# Patient Record
Sex: Female | Born: 1980 | Race: Black or African American | Hispanic: No | Marital: Married | Smoking: Never smoker
Health system: Southern US, Community
[De-identification: ages and names within clinical notes are randomized; demographics above are authoritative.]

## PROBLEM LIST (undated history)

## (undated) DIAGNOSIS — T7840XA Allergy, unspecified, initial encounter: Secondary | ICD-10-CM

## (undated) DIAGNOSIS — G4730 Sleep apnea, unspecified: Secondary | ICD-10-CM

## (undated) DIAGNOSIS — E669 Obesity, unspecified: Secondary | ICD-10-CM

## (undated) DIAGNOSIS — G43909 Migraine, unspecified, not intractable, without status migrainosus: Secondary | ICD-10-CM

## (undated) DIAGNOSIS — F419 Anxiety disorder, unspecified: Secondary | ICD-10-CM

## (undated) DIAGNOSIS — I10 Essential (primary) hypertension: Secondary | ICD-10-CM

## (undated) DIAGNOSIS — R635 Abnormal weight gain: Secondary | ICD-10-CM

## (undated) DIAGNOSIS — I2510 Atherosclerotic heart disease of native coronary artery without angina pectoris: Secondary | ICD-10-CM

## (undated) DIAGNOSIS — F329 Major depressive disorder, single episode, unspecified: Secondary | ICD-10-CM

## (undated) DIAGNOSIS — K589 Irritable bowel syndrome without diarrhea: Secondary | ICD-10-CM

## (undated) DIAGNOSIS — K219 Gastro-esophageal reflux disease without esophagitis: Secondary | ICD-10-CM

## (undated) DIAGNOSIS — Z6791 Unspecified blood type, Rh negative: Secondary | ICD-10-CM

## (undated) DIAGNOSIS — N926 Irregular menstruation, unspecified: Secondary | ICD-10-CM

## (undated) DIAGNOSIS — Z87440 Personal history of urinary (tract) infections: Secondary | ICD-10-CM

## (undated) DIAGNOSIS — Z8619 Personal history of other infectious and parasitic diseases: Secondary | ICD-10-CM

## (undated) DIAGNOSIS — F32A Depression, unspecified: Secondary | ICD-10-CM

## (undated) DIAGNOSIS — Z8639 Personal history of other endocrine, nutritional and metabolic disease: Secondary | ICD-10-CM

## (undated) DIAGNOSIS — J45909 Unspecified asthma, uncomplicated: Secondary | ICD-10-CM

## (undated) DIAGNOSIS — Z8669 Personal history of other diseases of the nervous system and sense organs: Secondary | ICD-10-CM

## (undated) HISTORY — DX: Abnormal weight gain: R63.5

## (undated) HISTORY — DX: Migraine, unspecified, not intractable, without status migrainosus: G43.909

## (undated) HISTORY — DX: Depression, unspecified: F32.A

## (undated) HISTORY — DX: Sleep apnea, unspecified: G47.30

## (undated) HISTORY — DX: Major depressive disorder, single episode, unspecified: F32.9

## (undated) HISTORY — DX: Gastro-esophageal reflux disease without esophagitis: K21.9

## (undated) HISTORY — DX: Atherosclerotic heart disease of native coronary artery without angina pectoris: I25.10

## (undated) HISTORY — DX: Anxiety disorder, unspecified: F41.9

## (undated) HISTORY — DX: Unspecified blood type, rh negative: Z67.91

## (undated) HISTORY — DX: Allergy, unspecified, initial encounter: T78.40XA

## (undated) HISTORY — DX: Irritable bowel syndrome, unspecified: K58.9

## (undated) HISTORY — DX: Essential (primary) hypertension: I10

## (undated) HISTORY — DX: Personal history of other diseases of the nervous system and sense organs: Z86.69

## (undated) HISTORY — DX: Personal history of other infectious and parasitic diseases: Z86.19

## (undated) HISTORY — DX: Irregular menstruation, unspecified: N92.6

## (undated) HISTORY — DX: Obesity, unspecified: E66.9

## (undated) HISTORY — DX: Personal history of other endocrine, nutritional and metabolic disease: Z86.39

## (undated) HISTORY — PX: OTHER SURGICAL HISTORY: SHX169

## (undated) HISTORY — PX: WISDOM TOOTH EXTRACTION: SHX21

## (undated) HISTORY — DX: Personal history of urinary (tract) infections: Z87.440

---

## 2002-03-19 DIAGNOSIS — R635 Abnormal weight gain: Secondary | ICD-10-CM

## 2002-03-19 DIAGNOSIS — N926 Irregular menstruation, unspecified: Secondary | ICD-10-CM

## 2002-03-19 HISTORY — DX: Irregular menstruation, unspecified: N92.6

## 2002-03-19 HISTORY — DX: Abnormal weight gain: R63.5

## 2002-12-30 ENCOUNTER — Other Ambulatory Visit: Admission: RE | Admit: 2002-12-30 | Discharge: 2002-12-30 | Payer: Self-pay | Admitting: Obstetrics and Gynecology

## 2003-08-03 ENCOUNTER — Emergency Department (HOSPITAL_COMMUNITY): Admission: EM | Admit: 2003-08-03 | Discharge: 2003-08-04 | Payer: Self-pay | Admitting: *Deleted

## 2003-12-11 ENCOUNTER — Emergency Department (HOSPITAL_COMMUNITY): Admission: EM | Admit: 2003-12-11 | Discharge: 2003-12-11 | Payer: Self-pay | Admitting: Emergency Medicine

## 2003-12-28 ENCOUNTER — Other Ambulatory Visit: Admission: RE | Admit: 2003-12-28 | Discharge: 2003-12-28 | Payer: Self-pay | Admitting: Obstetrics and Gynecology

## 2004-12-29 ENCOUNTER — Other Ambulatory Visit: Admission: RE | Admit: 2004-12-29 | Discharge: 2004-12-29 | Payer: Self-pay | Admitting: Obstetrics and Gynecology

## 2005-03-19 DIAGNOSIS — Z87440 Personal history of urinary (tract) infections: Secondary | ICD-10-CM

## 2005-03-19 HISTORY — DX: Personal history of urinary (tract) infections: Z87.440

## 2006-01-29 ENCOUNTER — Other Ambulatory Visit: Admission: RE | Admit: 2006-01-29 | Discharge: 2006-01-29 | Payer: Self-pay | Admitting: Obstetrics and Gynecology

## 2006-04-20 ENCOUNTER — Emergency Department (HOSPITAL_COMMUNITY): Admission: EM | Admit: 2006-04-20 | Discharge: 2006-04-20 | Payer: Self-pay | Admitting: Emergency Medicine

## 2009-03-19 DIAGNOSIS — I2510 Atherosclerotic heart disease of native coronary artery without angina pectoris: Secondary | ICD-10-CM

## 2009-03-19 DIAGNOSIS — Z8639 Personal history of other endocrine, nutritional and metabolic disease: Secondary | ICD-10-CM

## 2009-03-19 HISTORY — DX: Personal history of other endocrine, nutritional and metabolic disease: Z86.39

## 2009-03-19 HISTORY — DX: Atherosclerotic heart disease of native coronary artery without angina pectoris: I25.10

## 2010-02-07 ENCOUNTER — Inpatient Hospital Stay (HOSPITAL_COMMUNITY)
Admission: AD | Admit: 2010-02-07 | Discharge: 2010-02-07 | Payer: Self-pay | Source: Home / Self Care | Admitting: Obstetrics and Gynecology

## 2010-05-30 LAB — RH IMMUNE GLOBULIN WORKUP (NOT WOMEN'S HOSP)
Antibody Screen: NEGATIVE
Unit division: 0

## 2010-06-10 ENCOUNTER — Inpatient Hospital Stay (HOSPITAL_COMMUNITY)
Admission: AD | Admit: 2010-06-10 | Discharge: 2010-06-10 | Disposition: A | Discharge status: Home / Self Care | Payer: BC Managed Care – PPO | Source: Ambulatory Visit | Attending: Obstetrics and Gynecology | Admitting: Obstetrics and Gynecology

## 2010-06-10 DIAGNOSIS — O368190 Decreased fetal movements, unspecified trimester, not applicable or unspecified: Secondary | ICD-10-CM | POA: Insufficient documentation

## 2010-06-10 LAB — URINALYSIS, ROUTINE W REFLEX MICROSCOPIC
Hgb urine dipstick: NEGATIVE
Nitrite: NEGATIVE
Protein, ur: NEGATIVE mg/dL
Urobilinogen, UA: 0.2 mg/dL (ref 0.0–1.0)

## 2010-07-01 ENCOUNTER — Inpatient Hospital Stay (HOSPITAL_COMMUNITY)
Admission: AD | Admit: 2010-07-01 | Discharge: 2010-07-01 | Disposition: A | Discharge status: Home / Self Care | Payer: BC Managed Care – PPO | Source: Ambulatory Visit | Attending: Obstetrics and Gynecology | Admitting: Obstetrics and Gynecology

## 2010-07-01 DIAGNOSIS — Z2989 Encounter for other specified prophylactic measures: Secondary | ICD-10-CM | POA: Insufficient documentation

## 2010-07-01 DIAGNOSIS — Z298 Encounter for other specified prophylactic measures: Secondary | ICD-10-CM | POA: Insufficient documentation

## 2010-07-01 DIAGNOSIS — Z3480 Encounter for supervision of other normal pregnancy, unspecified trimester: Secondary | ICD-10-CM | POA: Insufficient documentation

## 2010-07-02 LAB — RH IMMUNE GLOBULIN WORKUP (NOT WOMEN'S HOSP)
ABO/RH(D): O NEG
Unit division: 0

## 2010-08-18 ENCOUNTER — Inpatient Hospital Stay (HOSPITAL_COMMUNITY): Payer: BC Managed Care – PPO

## 2010-08-18 ENCOUNTER — Observation Stay (HOSPITAL_COMMUNITY): Payer: BC Managed Care – PPO

## 2010-08-18 ENCOUNTER — Observation Stay (HOSPITAL_COMMUNITY)
Admission: AD | Admit: 2010-08-18 | Discharge: 2010-08-19 | Disposition: A | Discharge status: Home / Self Care | Payer: BC Managed Care – PPO | Source: Ambulatory Visit | Attending: Obstetrics and Gynecology | Admitting: Obstetrics and Gynecology

## 2010-08-18 DIAGNOSIS — Y92410 Unspecified street and highway as the place of occurrence of the external cause: Secondary | ICD-10-CM | POA: Insufficient documentation

## 2010-08-18 DIAGNOSIS — O3660X0 Maternal care for excessive fetal growth, unspecified trimester, not applicable or unspecified: Secondary | ICD-10-CM | POA: Insufficient documentation

## 2010-08-18 DIAGNOSIS — E669 Obesity, unspecified: Secondary | ICD-10-CM | POA: Insufficient documentation

## 2010-08-18 DIAGNOSIS — O99891 Other specified diseases and conditions complicating pregnancy: Principal | ICD-10-CM | POA: Insufficient documentation

## 2010-08-18 LAB — TYPE AND SCREEN: ABO/RH(D): O NEG

## 2010-08-18 LAB — URINALYSIS, ROUTINE W REFLEX MICROSCOPIC
Glucose, UA: NEGATIVE mg/dL
Protein, ur: NEGATIVE mg/dL
Urobilinogen, UA: 0.2 mg/dL (ref 0.0–1.0)

## 2010-08-18 LAB — WET PREP, GENITAL: Clue Cells Wet Prep HPF POC: NONE SEEN

## 2010-08-18 LAB — KLEIHAUER-BETKE STAIN
Fetal Cells %: 0 %
Quantitation Fetal Hemoglobin: 0 mL

## 2010-08-20 LAB — STREP B DNA PROBE

## 2010-09-16 ENCOUNTER — Inpatient Hospital Stay (HOSPITAL_COMMUNITY)
Admission: AD | Admit: 2010-09-16 | Discharge: 2010-09-18 | DRG: 372 | Disposition: A | Discharge status: Home / Self Care | Payer: BC Managed Care – PPO | Source: Ambulatory Visit | Attending: Obstetrics and Gynecology | Admitting: Obstetrics and Gynecology

## 2010-09-16 LAB — CBC
HCT: 41.6 % (ref 36.0–46.0)
Hemoglobin: 14.4 g/dL (ref 12.0–15.0)
RDW: 14 % (ref 11.5–15.5)
WBC: 8.3 10*3/uL (ref 4.0–10.5)

## 2010-09-17 LAB — CBC
HCT: 35.8 % — ABNORMAL LOW (ref 36.0–46.0)
MCV: 87.5 fL (ref 78.0–100.0)
Platelets: 177 10*3/uL (ref 150–400)
RBC: 4.09 MIL/uL (ref 3.87–5.11)
WBC: 12.6 10*3/uL — ABNORMAL HIGH (ref 4.0–10.5)

## 2010-09-18 LAB — RH IMMUNE GLOB WKUP(>/=20WKS)(NOT WOMEN'S HOSP)

## 2010-09-20 ENCOUNTER — Inpatient Hospital Stay (HOSPITAL_COMMUNITY)
Admission: AD | Admit: 2010-09-20 | Discharge: 2010-09-20 | Disposition: A | Discharge status: Home / Self Care | Payer: BC Managed Care – PPO | Source: Ambulatory Visit | Attending: Obstetrics and Gynecology | Admitting: Obstetrics and Gynecology

## 2010-09-20 DIAGNOSIS — IMO0002 Reserved for concepts with insufficient information to code with codable children: Secondary | ICD-10-CM | POA: Insufficient documentation

## 2010-09-20 LAB — CBC
Hemoglobin: 11.4 g/dL — ABNORMAL LOW (ref 12.0–15.0)
MCH: 29.4 pg (ref 26.0–34.0)
RBC: 3.88 MIL/uL (ref 3.87–5.11)
WBC: 7.2 10*3/uL (ref 4.0–10.5)

## 2010-09-20 LAB — URINALYSIS, ROUTINE W REFLEX MICROSCOPIC
Bilirubin Urine: NEGATIVE
Ketones, ur: NEGATIVE mg/dL
Nitrite: NEGATIVE
pH: 6 (ref 5.0–8.0)

## 2010-09-20 LAB — COMPREHENSIVE METABOLIC PANEL
ALT: 67 U/L — ABNORMAL HIGH (ref 0–35)
Alkaline Phosphatase: 111 U/L (ref 39–117)
CO2: 28 mEq/L (ref 19–32)
Chloride: 104 mEq/L (ref 96–112)
GFR calc Af Amer: >60 mL/min (ref >60)
GFR calc non Af Amer: >60 mL/min (ref >60)
Glucose, Bld: 73 mg/dL (ref 70–99)
Potassium: 3.9 mEq/L (ref 3.5–5.1)
Sodium: 136 mEq/L (ref 135–145)
Total Bilirubin: 0.2 mg/dL — ABNORMAL LOW (ref 0.3–1.2)

## 2010-09-20 LAB — URINE MICROSCOPIC-ADD ON

## 2010-09-22 ENCOUNTER — Inpatient Hospital Stay (HOSPITAL_COMMUNITY)
Admission: AD | Admit: 2010-09-22 | Discharge: 2010-09-22 | Disposition: A | Discharge status: Home / Self Care | Payer: BC Managed Care – PPO | Source: Ambulatory Visit | Attending: Obstetrics and Gynecology | Admitting: Obstetrics and Gynecology

## 2010-09-22 DIAGNOSIS — R030 Elevated blood-pressure reading, without diagnosis of hypertension: Secondary | ICD-10-CM | POA: Insufficient documentation

## 2010-09-22 DIAGNOSIS — IMO0002 Reserved for concepts with insufficient information to code with codable children: Secondary | ICD-10-CM | POA: Insufficient documentation

## 2010-09-22 DIAGNOSIS — O99891 Other specified diseases and conditions complicating pregnancy: Secondary | ICD-10-CM | POA: Insufficient documentation

## 2010-09-22 LAB — COMPREHENSIVE METABOLIC PANEL
AST: 39 U/L — ABNORMAL HIGH (ref 0–37)
BUN: 16 mg/dL (ref 6–23)
CO2: 26 mEq/L (ref 19–32)
Calcium: 9 mg/dL (ref 8.4–10.5)
Chloride: 102 mEq/L (ref 96–112)
Creatinine, Ser: 1.27 mg/dL — ABNORMAL HIGH (ref 0.50–1.10)
GFR calc non Af Amer: 49 mL/min — ABNORMAL LOW (ref >60)
Total Bilirubin: 0.2 mg/dL — ABNORMAL LOW (ref 0.3–1.2)

## 2010-09-22 LAB — URIC ACID: Uric Acid, Serum: 7.9 mg/dL — ABNORMAL HIGH (ref 2.4–7.0)

## 2010-09-22 LAB — CBC
HCT: 38 % (ref 36.0–46.0)
MCH: 29.4 pg (ref 26.0–34.0)
MCV: 89.4 fL (ref 78.0–100.0)
Platelets: 255 10*3/uL (ref 150–400)
RDW: 14.8 % (ref 11.5–15.5)

## 2010-09-22 LAB — LACTATE DEHYDROGENASE: LDH: 272 U/L — ABNORMAL HIGH (ref 94–250)

## 2010-09-26 ENCOUNTER — Inpatient Hospital Stay (HOSPITAL_COMMUNITY): Admission: AD | Admit: 2010-09-26 | Payer: Self-pay | Source: Home / Self Care | Admitting: Obstetrics and Gynecology

## 2010-10-23 NOTE — Progress Notes (Signed)
Mom is here for feeding assessment. Baby Boy Molly Maduro was born [birth year] weighing 7lb. 12 oz. At first Peds appointment had lost 1 lb and was started on supplemental formula Enfamil AR. Baby developed jaundice after d/c home and was on photo therapy for approx 1 week. Baby  Molly Maduro is currently 45 weeks old. Mom reports breastfeeding every 4 hours for 30 minutes, approx 15-20 each breast. She is pumping 30 minutes after each feeding for 20 minutes getting approx 2 oz of EBM. Baby Molly Maduro is getting supplemented after each feeding with EBM or a mixture of EBM/Formula to equal 2 oz. Using a bottle. On the visit today, Baby Molly Maduro weighted 9lb 13.6 oz before feeding,  eagerly latched on the left breast and nursed for 18 minutes, post feed weight indicated he transferred 16 ml. Baby Robert did demonstrate good rhythmic sucks and swallows were heard at beginning of feed. Post-feed weight was 9lb 14.2 oz. Baby  Robert latched to right breast and nursed for 20 minutes, post=feed weight 9lb. 14.4 oz indicating a transfer of 8ml. Total transfer with feeding 24 ml.  After nursing for approx 5-10 minutes he would come on and off the breast, he would re-latch but would take a few minutes for him to find a rhythm. Baby Molly Maduro appeared to be frustrated at the breast after 10 minutes of nursing. Assisted mom with positioning and maintaining deep latch, Baby Robert tucks top lip inward.  Discussed with mom the amount Phineas Real should be getting at each feeding at the breast. Discussed with her that I was concerned she may not be making enough milk or that Phineas Real is getting used to supplements with the bottle after breastfeeding and becoming frustrated with the milk flow at the breast. Mom breasts did soften after the feedings. Encouraged mom to nurse every 2-3 hours for 15-20 minutes. Discussed using an SNS to supplement to keep Phineas Real at the breast. She has used in the past and feels she can do this again. Advised to  supplement using SNS on 2nd breast with 3 oz of EBM or mixture of EBM/Formula. Post-pump after each feeding to encourage milk production. Re-start Fenugreek  3 capsule 3 times/day.   Follow up appointment with Lactation was scheduled for Wednesday, 11/01/10 at 10:30 to re-check weight and assess feedings.

## 2010-10-25 DIAGNOSIS — Z8669 Personal history of other diseases of the nervous system and sense organs: Secondary | ICD-10-CM

## 2010-10-25 HISTORY — DX: Personal history of other diseases of the nervous system and sense organs: Z86.69

## 2010-11-01 ENCOUNTER — Ambulatory Visit (HOSPITAL_COMMUNITY)
Admission: RE | Admit: 2010-11-01 | Discharge: 2010-11-01 | Disposition: A | Discharge status: Home / Self Care | Payer: BC Managed Care – PPO | Source: Ambulatory Visit | Attending: Obstetrics and Gynecology | Admitting: Obstetrics and Gynecology

## 2010-11-01 NOTE — Progress Notes (Addendum)
Adult Lactation Consultation Outpatient Visit Note  Patient Name: Monique Shaw Date of Birth: 28-Aug-1980 Gestational Age at Delivery: Unknown Type of Delivery:    Breastfeeding History: Frequency of Breastfeeding: Every 2 to 3 hours Length of Feeding: 10 to 15 minutes on each breast Voids: 8 to 10 Stools: 1 to 3 (3 for the last several of days)  Supplementing / Method: Pumping:  Type of Pump:DEBP   Frequency: After every feeding during the day and 2 to 3 times at night (Did not pump at all last night, just breastfed).  Was out and about yesterday so only pumped a couple of times yesterday during the day and he seemed satisfied.  Volume:Usually 3 oz when pump 15 to 20 minutes after breastfeeding    Comments: Giving 2oz of breastmilk and 2oz of formula for 3 feedings a day   Consultation Evaluation:  Mom was only pumping less than 2 oz before started taking 3 Fenugreek a day (now pumping 3 plus ounces).  Monique Shaw was really sleepy and lethagic when he had jaundice (went up to 18) and was tucking his lower lip in causing mom some nipple pain.  He was on bili lites at home for 1 to 2 weeks and nurse was coming every day to take bili level. Started supplementing at 3 weeks old.  Now he is a lot more alert once the jaundice is gone.  He is latching correctly and easily now.  Still breastfeeding and pumping afterward 2 to 3 times a night.  Would like to cut out night time pumping, because will be going back to work September 17th.      Initial Feeding Assessment: Pre-feed [id number] Post-feed [id number] Amount Transferred:40ml from left breast, but Monique Shaw was on and off breast distracted passing a lot of gas Comments:  Additional Feeding Assessment: Pre-feed FAOZHY:8657 Post-feed QIONGE:9528 Amount Transferred:35ml from right breast Comments:  Additional Feeding Assessment: Pre-feed Weight: Post-feed Weight: Amount Transferred: Comments:  Total Breast milk Transferred this Visit:  74ml Total Supplement Given:   Additional Interventions: Monique Shaw was 9lbs 13oz at last appointment 1 1/2 to 2 weeks ago and today's Nude weight was 10lbs 9.7oz (4812 gms) which is a gain of 12oz in less than 2 weeks.  Mom says he seems satiated after just breastfeeding at night and most of the time during the day now.  Encouraged mom to keep breastfeeding during the day every 2 to 3 hours and pump whenever possible.  May continue to supplement with breastmilk during the day if needed.  Agreed with mom that she should continue to breastfeed at night every 3 to 4 hours and cut out pumping and supplementing if baby seemed satisfied.  Reviewed signs of adequate intake with mom and to call if concerned.  Discussed plan of care when return to work of breastfeeding and pumping afterward in am before work.  Pumping every 3 hours for 20 minutes at work.  Then nurse and pump as soon as get home from work and pump after last nightime feeding.  Encouraged to continue to drink plenty, eat healthy, and take Fenugreek.  Follow-Up  No further f/u lactation consult scheduled at this time, but encouraged to call if concerned.  Mom does plan to come to breastfeeding support group where baby can get another weight check on Tuesday.    Louis Meckel 11/01/2010, 12:37 PM

## 2010-11-01 NOTE — Progress Notes (Deleted)
Adult Lactation Consultation Outpatient Visit Note  Patient Name: VALBONA SLABACH Date of Birth: 1980-09-06 Gestational Age at Delivery: Unknown Type of Delivery:   Breastfeeding History: Frequency of Breastfeeding: Every 2 hrs. During day and 3 to 4 hours at night          Length of Feeding: varies 5 minutes to 20 minutes per breast Voids: 10 Stools: 10  Supplementing / Method: Pumping:None - exclusive breastfeeding at the breast  Type of Pump:   Frequency:  Volume:    Comments: Jule Economy has gained 1lb. since discharge and has surpassed birth weight by 10 oz.  Vaishvavi's color was good with good muslcle tone.  Sleepy because had just breastfed an hr. before consult.  Consultation Evaluation: Observed breastfeeding.  Breasts were already filling again.  Excellent latch achieved without difficulty or assistance.  Immediately began good rhythmic sucking and swallowing.  Demonstrated how to massage breasts to keep sucking since baby was a little sleepy and had hard areas back in breast.  Nude weight was 2854 gms (6lbs. 4.7oz). Was too sleepy to go to second breast since just breastfed at home 1 hr. before consult.  Breast was softer after feeding without hard or lumpy areas with Vaishnavi draining first breast well.  Encouraged mom to continue to do exactly what she is already doing since baby is gaining adequately with no medical concerns.  Has follow up appt with Dr. Luz Brazen at [facility] Sept. 5th.  Encouraged mom to come to support group where we will weigh again, but if concerned before that time about feeding to please call us for another out patient consult.    Initial Feeding Assessment: Pre-feed Weight:2870 (6lb 5.1oz) Post-feed Weight: 2928 (6lb 5.1oz) Amount Transferred:16ml (gm) Comments:This feeding is on only one breast.  Calculations show Vaishnavi only needs 2.1 oz every 3 hours to continue to gain weight effectively, which she is doing.  Additional Feeding  Assessment: Pre-feed Weight: Post-feed Weight: Amount Transferred: Comments:  Additional Feeding Assessment: Pre-feed Weight: Post-feed Weight: Amount Transferred: Comments:  Total Breast milk Transferred this Visit:  Total Supplement Given:   Additional Interventions:   Follow-Up  Has follow up with Dr. Luz Brazen September 5th.  Plans to attend Breastfeeding support group where baby will be weighed again, but question, concerns, or would like follow up outpatient consult before that time, to please call.      Louis Meckel 11/01/2010, 9:35 AM

## 2011-09-30 ENCOUNTER — Encounter (HOSPITAL_COMMUNITY): Payer: Self-pay | Admitting: *Deleted

## 2011-09-30 ENCOUNTER — Emergency Department (HOSPITAL_COMMUNITY)
Admission: EM | Admit: 2011-09-30 | Discharge: 2011-10-01 | Disposition: A | Discharge status: Home / Self Care | Payer: BC Managed Care – PPO | Attending: Emergency Medicine | Admitting: Emergency Medicine

## 2011-09-30 DIAGNOSIS — M79609 Pain in unspecified limb: Secondary | ICD-10-CM | POA: Insufficient documentation

## 2011-09-30 DIAGNOSIS — M79606 Pain in leg, unspecified: Secondary | ICD-10-CM

## 2011-09-30 NOTE — ED Notes (Signed)
Pt sts that she began having right calf pain at approximately 2030 tonight. Was told to come be evaluated to rule out DVT by OB GYN.

## 2011-09-30 NOTE — ED Notes (Signed)
Pt sts leg feels swollen, warmer and harder to walk on. Patient ambulated to triage with no assistive device. No change in color or temperature.

## 2011-10-01 ENCOUNTER — Telehealth: Payer: Self-pay | Admitting: Obstetrics and Gynecology

## 2011-10-01 LAB — D-DIMER, QUANTITATIVE: D-Dimer, Quant: 0.36 ug/mL-FEU (ref 0.00–0.48)

## 2011-10-01 MED ORDER — NAPROXEN 500 MG PO TABS: 500.0000 mg | ORAL_TABLET | Freq: Two times a day (BID) | ORAL | Status: DC

## 2011-10-01 NOTE — Telephone Encounter (Signed)
TC from patient--having pain in right lower leg, started tonight.  Pain primarily located in calf, with some discomfort with walking. On OCPs, concerned about clot. Leg is not obviously swollen.  Issues reviewed. Recommended patient be seen in ER to evaluate for DVT. Patient advises she plans to go to [facility].

## 2011-10-01 NOTE — ED Notes (Signed)
Pt is taking birth control for about 13 years.

## 2011-10-01 NOTE — ED Notes (Signed)
Skin temp on right calf is same as right.

## 2011-10-01 NOTE — ED Provider Notes (Signed)
History     [name]: [id number]  Arrival date & time 09/30/11  2322   First MD Initiated Contact with Patient 10/01/11 0002      Chief Complaint  Patient presents with  . Leg Pain    right    (Consider location/radiation/quality/duration/timing/severity/associated sxs/prior treatment) HPI Comments: 31 year old female who is on oral contraceptive pills but has no other significant medical problems including no history of venous thromboembolism presents with a complaint of right calf pain. This is that ongoing for approximately 2-3 hours, mild, has some difficulty walking on it because of pain, no associated shortness of breath chest pain swelling, trauma, travel, immobilization, smoking. She does have some obesity as well. She was told by her OB/GYN that she needed to come to the hospital to be ruled out for deep venous thrombosis.  Patient is a 31 y.o. female presenting with leg pain. The history is provided by the patient.  Leg Pain     History reviewed. No pertinent past medical history.  History reviewed. No pertinent past surgical history.  History reviewed. No pertinent family history.  History  Substance Use Topics  . Smoking status: Never Smoker   . Smokeless tobacco: Not on file  . Alcohol Use: No    OB History    Grav Para Term Preterm Abortions TAB SAB Ect Mult Living                  Review of Systems  All other systems reviewed and are negative.    Allergies  Review of patient's allergies indicates no known allergies.  Home Medications   Current Outpatient Rx  Name Route Sig Dispense Refill  . AMPHETAMINE-DEXTROAMPHET ER 10 MG PO CP24 Oral Take 10 mg by mouth every morning.    Marland Kitchen LEVONORGESTREL-ETHINYL ESTRAD 0.1-20 MG-MCG PO TABS Oral Take 1 tablet by mouth daily.    Marland Kitchen NAPROXEN 500 MG PO TABS Oral Take 1 tablet (500 mg total) by mouth 2 (two) times daily with a meal. 30 tablet 0    BP 129/85  Pulse 70  Temp 97.9 F (36.6 C) (Oral)  Resp 20  SpO2  100%  LMP 09/17/2011  Physical Exam  Nursing note and vitals reviewed. Constitutional: She appears well-developed and well-nourished. No distress.  HENT:  Head: Normocephalic and atraumatic.  Mouth/Throat: Oropharynx is clear and moist. No oropharyngeal exudate.  Eyes: Conjunctivae and EOM are normal. Pupils are equal, round, and reactive to light. Right eye exhibits no discharge. Left eye exhibits no discharge. No scleral icterus.  Neck: Normal range of motion. Neck supple. No JVD present. No thyromegaly present.  Cardiovascular: Normal rate, regular rhythm, normal heart sounds and intact distal pulses.  Exam reveals no gallop and no friction rub.   No murmur heard. Pulmonary/Chest: Effort normal and breath sounds normal. No respiratory distress. She has no wheezes. She has no rales.  Abdominal: Soft. Bowel sounds are normal. She exhibits no distension and no mass. There is no tenderness.  Musculoskeletal: Normal range of motion. She exhibits tenderness ( Minimal tenderness to the right posterior calf, negative Homans sign). She exhibits no edema.       No asymmetry of the lower extremities  Lymphadenopathy:    She has no cervical adenopathy.  Neurological: She is alert. Coordination normal.  Skin: Skin is warm and dry. No rash noted. No erythema.  Psychiatric: She has a normal mood and affect. Her behavior is normal.    ED Course  Procedures (including critical care time)  Labs Reviewed  D-DIMER, QUANTITATIVE   No results found.   1. Leg pain       MDM  The patient is well appearing with normal vital signs including no tachycardia or hypoxia or respiratory distress. Exam is reassuring, d-dimer ordered to rule out DVT. The patient states that she has had some dehydration and had a migraine 2 nights ago which caused her to be nausea vomiting and had poor by mouth intake over the last 2 days ago it is getting better. Will encourage by mouth rehydration.  D-dimer negative,  vital signs normal, patient informed and encouraged to return should symptoms worsen, resource list given, Naprosyn for home.      Vida Roller, MD 10/01/11 [phone]

## 2011-10-06 ENCOUNTER — Ambulatory Visit: Payer: BC Managed Care – PPO

## 2011-10-06 ENCOUNTER — Ambulatory Visit (INDEPENDENT_AMBULATORY_CARE_PROVIDER_SITE_OTHER): Payer: BC Managed Care – PPO | Admitting: Internal Medicine

## 2011-10-06 VITALS — BP 116/74 | HR 61 | Temp 98.3°F | Resp 16 | Ht 69.5 in | Wt 283.0 lb

## 2011-10-06 DIAGNOSIS — M79609 Pain in unspecified limb: Secondary | ICD-10-CM

## 2011-10-06 DIAGNOSIS — M25579 Pain in unspecified ankle and joints of unspecified foot: Secondary | ICD-10-CM

## 2011-10-06 DIAGNOSIS — M79606 Pain in leg, unspecified: Secondary | ICD-10-CM

## 2011-10-06 NOTE — Progress Notes (Signed)
  Subjective:    Patient ID: Monique Shaw, female    DOB: 05-03-1980, 31 y.o.   MRN: [id number]  HPI C/o progressive pain in left ankle for over 1 month. No trauma, not tender to touch but  Painful to bear weight. No signs of dvt. New 1 y/o baby boy  Review of Systems obesity    Objective:   Physical Exam Left ankle maybe swollen, not red or warm Has full rom with pain, nms intact Abn gait [facility] reading (PRIMARY) by  Dr.guest spur anterior jt, could cause the pain        Assessment & Plan:  Pain ankle 1 month Camwalker, ice , alieve till better

## 2011-11-02 ENCOUNTER — Ambulatory Visit (INDEPENDENT_AMBULATORY_CARE_PROVIDER_SITE_OTHER): Payer: BC Managed Care – PPO

## 2011-11-02 VITALS — BP 130/72 | Ht 69.0 in | Wt 287.0 lb

## 2011-11-02 DIAGNOSIS — Z124 Encounter for screening for malignant neoplasm of cervix: Secondary | ICD-10-CM

## 2011-11-02 DIAGNOSIS — R3915 Urgency of urination: Secondary | ICD-10-CM

## 2011-11-02 DIAGNOSIS — Z309 Encounter for contraceptive management, unspecified: Secondary | ICD-10-CM

## 2011-11-02 DIAGNOSIS — G43909 Migraine, unspecified, not intractable, without status migrainosus: Secondary | ICD-10-CM

## 2011-11-02 LAB — POCT URINALYSIS DIPSTICK
Blood, UA: NEGATIVE
Glucose, UA: NEGATIVE
Nitrite, UA: NEGATIVE
Protein, UA: NEGATIVE
Spec Grav, UA: <=1.005
Urobilinogen, UA: NEGATIVE
pH, UA: 8

## 2011-11-02 MED ORDER — LEVONORGESTREL-ETHINYL ESTRAD 0.1-20 MG-MCG PO TABS: 1.0000 | ORAL_TABLET | Freq: Every day | ORAL | Status: DC

## 2011-11-02 MED ORDER — SUMATRIPTAN SUCCINATE 50 MG PO TABS: 50.0000 mg | ORAL_TABLET | ORAL | Status: DC | PRN

## 2011-11-02 NOTE — Progress Notes (Signed)
The patient reports:no complaints  Contraception:oral contraceptives (estrogen/progesterone)  Last mammogram: not applicable  Last pap: approximate date 10/2010 and was normal   GC/Chlamydia cultures offered: declined HIV/RPR/HbsAg offered:  declined HSV 1 and 2 glycoprotein offered: declined  Menstrual cycle regular and monthly: Yes Menstrual flow normal: No:   Urinary symptoms: urinary urgency.  Normal bowel movements: Yes Reports abuse at home: No:

## 2011-11-25 ENCOUNTER — Ambulatory Visit (INDEPENDENT_AMBULATORY_CARE_PROVIDER_SITE_OTHER): Payer: BC Managed Care – PPO | Admitting: Physician Assistant

## 2011-11-25 ENCOUNTER — Encounter: Payer: Self-pay | Admitting: Physician Assistant

## 2011-11-25 VITALS — BP 114/79 | HR 76 | Temp 98.0°F | Resp 17 | Ht 69.5 in | Wt 279.0 lb

## 2011-11-25 DIAGNOSIS — J029 Acute pharyngitis, unspecified: Secondary | ICD-10-CM

## 2011-11-25 DIAGNOSIS — R05 Cough: Secondary | ICD-10-CM

## 2011-11-25 DIAGNOSIS — J40 Bronchitis, not specified as acute or chronic: Secondary | ICD-10-CM

## 2011-11-25 DIAGNOSIS — J309 Allergic rhinitis, unspecified: Secondary | ICD-10-CM

## 2011-11-25 DIAGNOSIS — R059 Cough, unspecified: Secondary | ICD-10-CM

## 2011-11-25 LAB — POCT RAPID STREP A (OFFICE): Rapid Strep A Screen: NEGATIVE

## 2011-11-25 MED ORDER — HYDROCODONE-HOMATROPINE 5-1.5 MG/5ML PO SYRP: 5.0000 mL | ORAL_SOLUTION | Freq: Three times a day (TID) | ORAL | Status: AC | PRN

## 2011-11-25 MED ORDER — AMOXICILLIN 875 MG PO TABS: 875.0000 mg | ORAL_TABLET | Freq: Two times a day (BID) | ORAL | Status: AC

## 2011-11-25 MED ORDER — IPRATROPIUM BROMIDE 0.03 % NA SOLN: 2.0000 | Freq: Two times a day (BID) | NASAL | Status: DC

## 2011-11-25 NOTE — Progress Notes (Signed)
  Subjective:    Patient ID: Monique Shaw, female    DOB: 1980/11/30, 31 y.o.   MRN: [id number]  HPI 31 year old female presents with 3 day history of nasal congestion, rhinorrhea, cough, and sinus pressure.  She also complains of fatigue and weakness similar to having the flu. She has not had any fevers or chills. Denies abdominal pain, nausea, vomiting, headache, or otalgia.  No known sick contacts. She has been taking Zyrtec and Mucinex which has not helped.      Review of Systems  All other systems reviewed and are negative.       Objective:   Physical Exam  Constitutional: She is oriented to person, place, and time. She appears well-developed and well-nourished.  HENT:  Head: Normocephalic and atraumatic.  Right Ear: Hearing, tympanic membrane, external ear and ear canal normal.  Left Ear: Hearing, tympanic membrane, external ear and ear canal normal.  Mouth/Throat: Uvula is midline, oropharynx is clear and moist and mucous membranes are normal. No oropharyngeal exudate (clear postnasal drainage).  Cardiovascular: Normal rate, regular rhythm and normal heart sounds.   Pulmonary/Chest: Effort normal and breath sounds normal.  Musculoskeletal: Normal range of motion.  Neurological: She is alert and oriented to person, place, and time.  Psychiatric: She has a normal mood and affect. Her behavior is normal. Judgment and thought content normal.          Assessment & Plan:   1. Acute pharyngitis  POCT rapid strep A  2. Bronchitis  amoxicillin (AMOXIL) 875 MG tablet  3. Allergic rhinitis  ipratropium (ATROVENT) 0.03 % nasal spray  4. Cough  HYDROcodone-homatropine (HYCODAN) 5-1.5 MG/5ML syrup  Use Atrovent nasal spray bid to help with drainage and congestion Take Hycodan prn cough Start Amoxicillin bid x 7 days.  Follow up if symptoms worsen or fail to improve.

## 2012-02-12 ENCOUNTER — Telehealth: Payer: Self-pay | Admitting: Obstetrics and Gynecology

## 2012-02-13 ENCOUNTER — Ambulatory Visit (INDEPENDENT_AMBULATORY_CARE_PROVIDER_SITE_OTHER): Payer: BC Managed Care – PPO | Admitting: Obstetrics and Gynecology

## 2012-02-13 ENCOUNTER — Encounter: Payer: Self-pay | Admitting: Obstetrics and Gynecology

## 2012-02-13 VITALS — BP 114/70 | Temp 98.4°F | Wt 276.0 lb

## 2012-02-13 DIAGNOSIS — N644 Mastodynia: Secondary | ICD-10-CM

## 2012-02-13 NOTE — Progress Notes (Signed)
31 YO complains of painful and warm left breast off & on x 1 week.  Pain is tingling in nature and occasionally dull occuring most days of the week. Denies any initiating factors, nipple discharge, trauma, is right handed, denies upper body exercise or strain.  States that a second degree relative with breast cancer (in early 59s)  and mother had a benign breast bx last year.  O;  Breasts: no focal nodules palpated in either breast, area of concern (upper inner aspect of left breast with mild tenderness); no discoloration, increased warmth, skin changes, nipple retraction/discharge or axillary adenopathy  A: Left Breast Pain/Perceived Warmth  P: Diagnostic Mammogram to evaluate breasts          RTO-as scheduled or prn  Davell Beckstead, PA-C

## 2012-02-20 ENCOUNTER — Ambulatory Visit
Admission: RE | Admit: 2012-02-20 | Discharge: 2012-02-20 | Disposition: A | Discharge status: Home / Self Care | Payer: BC Managed Care – PPO | Source: Ambulatory Visit | Attending: Obstetrics and Gynecology | Admitting: Obstetrics and Gynecology

## 2012-02-20 DIAGNOSIS — N644 Mastodynia: Secondary | ICD-10-CM

## 2012-02-24 ENCOUNTER — Ambulatory Visit (INDEPENDENT_AMBULATORY_CARE_PROVIDER_SITE_OTHER): Payer: BC Managed Care – PPO | Admitting: Family Medicine

## 2012-02-24 ENCOUNTER — Ambulatory Visit: Payer: BC Managed Care – PPO

## 2012-02-24 VITALS — BP 120/70 | HR 86 | Temp 98.4°F | Resp 18 | Ht 69.5 in | Wt 274.0 lb

## 2012-02-24 DIAGNOSIS — S96919A Strain of unspecified muscle and tendon at ankle and foot level, unspecified foot, initial encounter: Secondary | ICD-10-CM

## 2012-02-24 DIAGNOSIS — M79676 Pain in unspecified toe(s): Secondary | ICD-10-CM

## 2012-02-24 DIAGNOSIS — S93609A Unspecified sprain of unspecified foot, initial encounter: Secondary | ICD-10-CM

## 2012-02-24 DIAGNOSIS — M79609 Pain in unspecified limb: Secondary | ICD-10-CM

## 2012-02-24 MED ORDER — MELOXICAM 7.5 MG PO TABS: 7.5000 mg | ORAL_TABLET | Freq: Two times a day (BID) | ORAL | Status: DC | PRN

## 2012-02-24 NOTE — Progress Notes (Signed)
Patient ID: Monique Shaw MRN: [id number], DOB: Aug 04, 1980, 31 y.o. Date of Encounter: 02/24/2012, 8:45 AM  Primary Physician: Pcp Not In System  Chief Complaint: Pain in left 3rd toe  HPI: 31 y.o. year old female with history below presents with pain in left 3rd toe after she ran/walked a 5k race the previous day. She did not have any pain during the race itself. After she finished the race she began to notice pain along her left third toe. Pain is located throughout the entire toe and it increases as you move distally along the toe. It is at its greatest along the tuft. She states the previous evening it was swollen. There was no discrete injury or trauma to the toe or foot. She was wearing regular running shoes with thick athletic socks. Prior to the race she had been training fairly regularly, until about a month before when she stopped for a month and was just doing some walking. During the race she did more walking than running. She did take some OTC NSAIDs for pain. Pain does not radiate up into the foot. She can ambulate without issue. She is otherwise doing well and without issue today.   Past Medical History  Diagnosis Date  . Allergy   . Depression   . H/O toxoplasmosis   . Rubella   . H/O varicella   . Blood type, Rh negative   . Obese   . Irregular menstrual cycle 2004  . Weight gain 2004  . Hx: UTI (urinary tract infection) 2007  . ASCVD (arteriosclerotic cardiovascular disease) 2011  . Hypertension   . History of elevated lipids 2011  . H/O migraine 10/25/10  . Anxiety      Home Meds: Prior to Admission medications   Medication Sig Start Date End Date Taking? Authorizing Provider  amphetamine-dextroamphetamine (ADDERALL) 30 MG tablet Take 30 mg by mouth 2 (two) times daily.   Yes Historical Provider, MD  levonorgestrel-ethinyl estradiol (AVIANE,ALESSE,LESSINA) 0.1-20 MG-MCG tablet Take 1 tablet by mouth daily. 11/02/11  Yes Candice H Steelman, CNM    SUMAtriptan (IMITREX) 50 MG tablet Take 1 tablet (50 mg total) by mouth every 2 (two) hours as needed. 11/02/11  Yes Candice H Steelman, CNM    Allergies: No Known Allergies  History   Social History  . Marital Status: Married    Spouse Name: N/A    Number of Children: N/A  . Years of Education: N/A   Occupational History  . Not on file.   Social History Main Topics  . Smoking status: Never Smoker   . Smokeless tobacco: Never Used  . Alcohol Use: No  . Drug Use: No  . Sexually Active: Yes    Birth Control/ Protection: Pill     Comment: ALESSE   Other Topics Concern  . Not on file   Social History Narrative  . No narrative on file     Review of Systems: Constitutional: negative for chills, fever, night sweats, weight changes, or fatigue  Cardiovascular: negative for chest pain or palpitations Dermatological: negative for rash, wound, or erythema   Physical Exam: Blood pressure 120/70, pulse 86, temperature 98.4 F (36.9 C), temperature source Oral, resp. rate 18, height 5' 9.5" (1.765 m), weight 274 lb (124.286 kg), last menstrual period 02/05/2012, SpO2 98.00%., Body mass index is 39.88 kg/(m^2). General: Well developed, well nourished, in no acute distress. Head: Normocephalic, atraumatic, eyes without discharge, sclera non-icteric, nares are without discharge.  Neck: Supple.  Lungs: Clear bilaterally  to auscultation without wheezes, rales, or rhonchi. Breathing is unlabored. Heart: RRR with S1 S2. No murmurs, rubs, or gallops appreciated. Msk:  Strength and tone normal for age. Extremities/Skin: Left third toe with TTP, greatest at tuft. Minimal STS. No erythema or ecchymosis. No lesions or wounds. Cap refill less than 2 seconds. Flexion and extension intact. Normal sensation. No TTP of the mid foot, or base of the 5th MTP. Neuro: Alert and oriented X 3. Moves all extremities spontaneously. Gait is normal. CNII-XII grossly in tact. Psych:  Responds to questions  appropriately with a normal affect.   Left foot: [facility] reading (PRIMARY) by  Dr. Katrinka Blazing. Negative   ASSESSMENT AND PLAN:  31 y.o. year old female with strain of left third toe and pain toe -Buddy tape -Mobic 7.5 mg 1 po bid prn #40 RF 2 -Treat conservatively for possible stress fracture that would not show up on plain film at this time -Advised patient should her pain persist to RTC -Avoid prolonged running type physical activity for the next 4-6 weeks    Signed, Eula Listen, PA-C 02/24/2012 8:45 AM

## 2012-02-24 NOTE — Progress Notes (Signed)
Below history and physical exam reviewed.  Foot xray reviewed with Eula Listen, PA-C.  Agree with assessment and plan.

## 2012-07-14 ENCOUNTER — Telehealth: Payer: Self-pay | Admitting: *Deleted

## 2012-07-14 ENCOUNTER — Encounter: Payer: Self-pay | Admitting: Internal Medicine

## 2012-07-14 ENCOUNTER — Ambulatory Visit (INDEPENDENT_AMBULATORY_CARE_PROVIDER_SITE_OTHER): Payer: BC Managed Care – PPO | Admitting: Internal Medicine

## 2012-07-14 VITALS — BP 127/83 | HR 84 | Temp 98.0°F | Resp 20 | Ht 69.0 in | Wt 283.0 lb

## 2012-07-14 DIAGNOSIS — R51 Headache: Secondary | ICD-10-CM

## 2012-07-14 DIAGNOSIS — E559 Vitamin D deficiency, unspecified: Secondary | ICD-10-CM

## 2012-07-14 DIAGNOSIS — J309 Allergic rhinitis, unspecified: Secondary | ICD-10-CM

## 2012-07-14 DIAGNOSIS — F419 Anxiety disorder, unspecified: Secondary | ICD-10-CM | POA: Insufficient documentation

## 2012-07-14 DIAGNOSIS — IMO0001 Reserved for inherently not codable concepts without codable children: Secondary | ICD-10-CM

## 2012-07-14 DIAGNOSIS — F32A Depression, unspecified: Secondary | ICD-10-CM

## 2012-07-14 DIAGNOSIS — F411 Generalized anxiety disorder: Secondary | ICD-10-CM

## 2012-07-14 DIAGNOSIS — Z800 Family history of malignant neoplasm of digestive organs: Secondary | ICD-10-CM

## 2012-07-14 DIAGNOSIS — F329 Major depressive disorder, single episode, unspecified: Secondary | ICD-10-CM

## 2012-07-14 DIAGNOSIS — R519 Headache, unspecified: Secondary | ICD-10-CM | POA: Insufficient documentation

## 2012-07-14 DIAGNOSIS — M791 Myalgia, unspecified site: Secondary | ICD-10-CM

## 2012-07-14 DIAGNOSIS — E785 Hyperlipidemia, unspecified: Secondary | ICD-10-CM | POA: Insufficient documentation

## 2012-07-14 LAB — CBC WITH DIFFERENTIAL/PLATELET
Eosinophils Absolute: 0.2 10*3/uL (ref 0.0–0.7)
Eosinophils Relative: 3 % (ref 0–5)
HCT: 43.2 % (ref 36.0–46.0)
Hemoglobin: 14.8 g/dL (ref 12.0–15.0)
Lymphocytes Relative: 35 % (ref 12–46)
Lymphs Abs: 2.2 10*3/uL (ref 0.7–4.0)
MCH: 30.8 pg (ref 26.0–34.0)
MCV: 89.8 fL (ref 78.0–100.0)
Monocytes Absolute: 0.4 10*3/uL (ref 0.1–1.0)
Monocytes Relative: 6 % (ref 3–12)
Platelets: 269 10*3/uL (ref 150–400)
RBC: 4.81 MIL/uL (ref 3.87–5.11)
WBC: 6.3 10*3/uL (ref 4.0–10.5)

## 2012-07-14 LAB — COMPREHENSIVE METABOLIC PANEL
ALT: 11 U/L (ref 0–35)
CO2: 27 mEq/L (ref 19–32)
Calcium: 9 mg/dL (ref 8.4–10.5)
Chloride: 103 mEq/L (ref 96–112)
Creat: 1.05 mg/dL (ref 0.50–1.10)
Glucose, Bld: 97 mg/dL (ref 70–99)
Total Protein: 6.5 g/dL (ref 6.0–8.3)

## 2012-07-14 LAB — T3, FREE: T3, Free: 2.7 pg/mL (ref 2.3–4.2)

## 2012-07-14 LAB — T4, FREE: Free T4: 1.04 ng/dL (ref 0.80–1.80)

## 2012-07-14 NOTE — Telephone Encounter (Signed)
Made appointment with Dr Marice Potter at [facility] for [facility] for May 1 at 1:45. Pt notified while at office.

## 2012-07-14 NOTE — Progress Notes (Signed)
Subjective:    Patient ID: Monique Shaw, female    DOB: 06-05-1980, 32 y.o.   MRN: [id number]  HPI  New pt here for first visit.  Former care Dr. Felipa Eth.  PMH of anxiety, and depression managed by Dr. Evelene Croon,  Hyperlipidemia, headaches (diagnosed by urgent care MD), allergic rhintis and vitamin D deficiency.    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   Monique Shaw is concerned over the possibility of fibromyalgia or adrenal fatigue.  She reports she recently saw Dr. Evelene Croon and asked to be hospitalized as she felt she was having a breakdown.  She was not hospitalized but her meds were changed to Cymbalta and her adderall was stopped.   She will also be tapering off of her lexapro.  She is on a leave of absence from school now.    She states she is extermely tired,  Want to nap during the day.  Dr. Felipa Eth wanted to do a sleep study as she does snore, is obese.  She is not sure about apnea.    She is on high dose vitamin D once a week  Review of labs done 03/31/2012 shows normal labs and TSH with exeption of LDL of 178    HDL 49 total  227  Her headaches are usually controlled with Imitrex but now they are occuring o R side of head which is different for her.  Sometimes associated with n/V and photophobia.    No Known Allergies Past Medical History  Diagnosis Date  . Allergy   . Depression   . H/O toxoplasmosis   . Rubella   . H/O varicella   . Blood type, Rh negative   . Obese   . Irregular menstrual cycle 2004  . Weight gain 2004  . Hx: UTI (urinary tract infection) 2007  . ASCVD (arteriosclerotic cardiovascular disease) 2011  . Hypertension   . History of elevated lipids 2011  . H/O migraine 10/25/10  . Anxiety    Past Surgical History  Procedure Laterality Date  . Wisdom tooth extraction     History   Social History  . Marital Status: Married    Spouse Name: N/A    Number of Children: N/A  . Years of Education: N/A   Occupational History  . Not on file.   Social  History Main Topics  . Smoking status: Never Smoker   . Smokeless tobacco: Never Used  . Alcohol Use: No  . Drug Use: No  . Sexually Active: Yes    Birth Control/ Protection: Pill     Comment: ALESSE   Other Topics Concern  . Not on file   Social History Narrative  . No narrative on file   Family History  Problem Relation Age of Onset  . Heart disease Mother   . Hypertension Mother   . Cancer Mother   . Hypertension Father   . Anemia Father   . Cancer Maternal Uncle   . Cancer Paternal Aunt   . Alcohol abuse Paternal Uncle   . Hypertension Maternal Grandmother   . Heart disease Paternal Grandfather     MI   There is no problem list on file for this patient.  Current Outpatient Prescriptions on File Prior to Visit  Medication Sig Dispense Refill  . levonorgestrel-ethinyl estradiol (AVIANE,ALESSE,LESSINA) 0.1-20 MG-MCG tablet Take 1 tablet by mouth daily.  1 Package  11  . SUMAtriptan (IMITREX) 50 MG tablet Take 1 tablet (50 mg total) by mouth every 2 (  two) hours as needed.  10 tablet  2   No current facility-administered medications on file prior to visit.       Review of Systems    see HPI Objective:   Physical Exam Physical Exam  Nursing note and vitals reviewed.  Constitutional: She is oriented to person, place, and time. She appears well-developed and well-nourished.  HENT:  Head: Normocephalic and atraumatic.  Cardiovascular: Normal rate and regular rhythm. Exam reveals no gallop and no friction rub.  No murmur heard.  Pulmonary/Chest: Breath sounds normal. She has no wheezes. She has no rales.  Neurological: She is alert and oriented to person, place, and time. CN II-XII intact Moto 5/5 UE and LE Sensory intact to touch Cerevellar in tact FTN Reflexes 2+ symmetric  Skin: Skin is warm and dry.  Psychiatric: She has a normal mood and affect. Her behavior is normal.            Assessment & Plan:  Anxiety/depression  Managed by Dr. Evelene Croon she is  currently adjusting her meds  Headache  Initially diagnosed as migraine by urgent care MD>  OK to continue Imitrex for now.  If persistant will consider imaging  Or referral to headache center.  Neruologic non focal on exam  Hyperlipdemia  She is notinterested in meds at this time.  Low fat diet  Insomnia  Agree with sleep study and I encouraged pt to have this done  Myalgias fatigue  Will refer to rheumatology  Labs to be done today  She is to see me in 4-6 weeks

## 2012-07-15 ENCOUNTER — Telehealth: Payer: Self-pay | Admitting: *Deleted

## 2012-07-15 NOTE — Telephone Encounter (Signed)
Notified Monique Shaw of appt with Dr Herma Carson at [facility] on Friday at 200 also instructed Monique Shaw to call and cancel her previous appt with Dr Nickola Major

## 2012-07-15 NOTE — Telephone Encounter (Signed)
Notified pt of appt with Dr Talmage Nap on 09/04/12 at 2;00

## 2012-07-16 ENCOUNTER — Encounter: Payer: Self-pay | Admitting: *Deleted

## 2012-07-23 ENCOUNTER — Telehealth: Payer: Self-pay | Admitting: *Deleted

## 2012-07-24 ENCOUNTER — Ambulatory Visit (INDEPENDENT_AMBULATORY_CARE_PROVIDER_SITE_OTHER): Payer: BC Managed Care – PPO | Admitting: Neurology

## 2012-07-24 ENCOUNTER — Encounter: Payer: Self-pay | Admitting: Neurology

## 2012-07-24 ENCOUNTER — Ambulatory Visit (INDEPENDENT_AMBULATORY_CARE_PROVIDER_SITE_OTHER): Payer: BC Managed Care – PPO

## 2012-07-24 VITALS — BP 136/85 | HR 89 | Temp 98.8°F | Ht 69.0 in | Wt 280.4 lb

## 2012-07-24 DIAGNOSIS — R404 Transient alteration of awareness: Secondary | ICD-10-CM

## 2012-07-24 DIAGNOSIS — R0683 Snoring: Secondary | ICD-10-CM

## 2012-07-24 DIAGNOSIS — R0989 Other specified symptoms and signs involving the circulatory and respiratory systems: Secondary | ICD-10-CM

## 2012-07-24 DIAGNOSIS — R400 Somnolence: Secondary | ICD-10-CM

## 2012-07-24 DIAGNOSIS — G43009 Migraine without aura, not intractable, without status migrainosus: Secondary | ICD-10-CM

## 2012-07-24 DIAGNOSIS — G4710 Hypersomnia, unspecified: Secondary | ICD-10-CM

## 2012-07-24 NOTE — Progress Notes (Signed)
Subjective:    Patient ID: Monique Shaw is a 32 y.o. female.  HPI   Huston Foley, MD, PhD [facility] [address], [address] [address] [place], [place] [address]  Dear Dr. Felipa Eth,   I saw your patient, Monique Shaw, upon your kind request in my neurologic clinic today for initial consultation of Her sleep disorder, in particular, concern for OSA. The patient is unaccompanied today. As you know, Monique Shaw is a very pleasant 32 y.o.-year-old Right female, with an underlying history of migraine, vitamin D deficiency, anxiety and recent Dx of FMS, who presents with a 6 month history of non-restorative sleep and EDS.  The patient reports palpitations, migraine headaches, myalgias, night sweats and nocturnal anxiety. Her typical bedtime is reported to be around 10 and 11 PM and usual wake time is around 5:30 AM. Sleep onset typically occurs within 15 minutes. She reports feeling not very rested upon awakening. She wakes up on an average 3 times in the middle of the night and has to go to the bathroom 2 to 3 times on a typical night. She reports frequent morning headaches.  She reports excessive daytime somnolence (EDS) and Her Epworth Sleepiness Score (ESS) is 12/24 today. She has not fallen asleep while driving.   She has been known to snore for the past few years. Snoring is reportedly moderate, but not associated with choking sounds or witnessed apneas. The patient denies a sense of choking or strangling feeling. There is no report of nighttime reflux, with rare nighttime cough experienced. The patient has not noted any RLS symptoms and is not known to kick while asleep or before falling asleep. She is a restless sleeper and in the morning, the bed is quite disheveled.   She denies cataplexy, sleep paralysis, hypnagogic or hypnopompic hallucinations, or sleep attacks. She does not report any vivid dreams, nightmares, dream enactments, or  parasomnias, such as sleep walking, but has mumbled in her sleep. The patient has not had a sleep study or a home sleep test.  She consumes 0 caffeinated beverage per day, or very rarely. Her mother has OSA, mild, per pt.  She reports 1-5 migraines per months and she has been taking imitrex and phenergan as needed.   Her Past Medical History Is Significant For: Past Medical History  Diagnosis Date  . Allergy   . Depression   . H/O toxoplasmosis   . Rubella   . H/O varicella   . Blood type, Rh negative   . Obese   . Irregular menstrual cycle 2004  . Weight gain 2004  . Hx: UTI (urinary tract infection) 2007  . ASCVD (arteriosclerotic cardiovascular disease) 2011  . Hypertension   . History of elevated lipids 2011  . H/O migraine 10/25/10  . Anxiety    Her Past Surgical History Is Significant For: Past Surgical History  Procedure Laterality Date  . Wisdom tooth extraction     Her Family History Is Significant For: Family History  Problem Relation Age of Onset  . Heart disease Mother   . Hypertension Mother   . Cancer Mother   . Hypertension Father   . Anemia Father   . Cancer Maternal Uncle   . Cancer Paternal Aunt   . Alcohol abuse Paternal Uncle   . Hypertension Maternal Grandmother   . Heart disease Paternal Grandfather     MI    Her Social History Is Significant For: History   Social History  . Marital  Status: Married    Spouse Name: N/A    Number of Children: 1  . Years of Education: M. ED   Occupational History  .     Social History Main Topics  . Smoking status: Never Smoker   . Smokeless tobacco: Never Used  . Alcohol Use: No  . Drug Use: No  . Sexually Active: Yes    Birth Control/ Protection: Pill     Comment: ALESSE   Other Topics Concern  . None   Social History Narrative  . None    Her Allergies Are:  No Known Allergies:   Her Current Medications Are:  Outpatient Encounter Prescriptions as of 07/24/2012  Medication Sig Dispense  Refill  . promethazine (PHENERGAN) 25 MG tablet Take 25 mg by mouth every 6 (six) hours as needed for nausea.      . ALPRAZolam (XANAX) 1 MG tablet Take 1 mg by mouth at bedtime as needed for sleep.      . Diethylpropion HCl (TENUATE PO) Take 25 mg by mouth daily.      . DULoxetine (CYMBALTA) 20 MG capsule Take 60 mg by mouth daily.       . ergocalciferol (VITAMIN D2) 50000 UNITS capsule Take 50,000 Units by mouth once a week.      Marland Kitchen levonorgestrel-ethinyl estradiol (AVIANE,ALESSE,LESSINA) 0.1-20 MG-MCG tablet Take 1 tablet by mouth daily.  1 Package  11  . SUMAtriptan (IMITREX) 50 MG tablet Take 1 tablet (50 mg total) by mouth every 2 (two) hours as needed.  10 tablet  2  . [DISCONTINUED] escitalopram (LEXAPRO) 20 MG tablet Take 20 mg by mouth daily.       No facility-administered encounter medications on file as of 07/24/2012.  :  Review of Systems  Constitutional: Positive for fatigue.  Respiratory:       Snoring  Endocrine:       Feeling hot  Musculoskeletal:       Aching muscles  Neurological: Positive for dizziness, weakness and headaches.       Memory loss  Hematological:       Easy Bruising  Psychiatric/Behavioral: Positive for sleep disturbance.       Depression,Anexity,Not enough sleep,Decreased energy,disinterest in activities.    Objective:  Neurologic Exam  Physical Exam Physical Examination:   Filed Vitals:   07/24/12 1109  BP: 136/85  Pulse: 89  Temp: 98.8 F (37.1 C)    General Examination: The patient is a very pleasant 32 y.o. female in no acute distress. She appears well-developed and well-nourished and well groomed.   HEENT: Normocephalic, atraumatic, pupils are equal, round and reactive to light and accommodation. Funduscopic exam is normal with sharp disc margins noted. Extraocular tracking is good without limitation to gaze excursion or nystagmus noted. Normal smooth pursuit is noted. Hearing is grossly intact. Tympanic membranes are clear  bilaterally. Face is symmetric with normal facial animation and normal facial sensation. Speech is clear with no dysarthria noted. There is no hypophonia. There is no lip, neck/head, jaw or voice tremor. Neck is supple with full range of passive and active motion. There are no carotid bruits on auscultation. Oropharynx exam reveals: good dental hygiene and mild airway crowding, due to larger tongue and wider based uvula. Mallampati is class I. Tongue protrudes centrally and palate elevates symmetrically. Tonsils are 1+. Neck size is 15 inches.   Chest: Clear to auscultation without wheezing, rhonchi or crackles noted.  Heart: S1+S2+0, regular and normal without murmurs, rubs or gallops noted.  Abdomen: Soft, non-tender and non-distended with normal bowel sounds appreciated on auscultation.  Extremities: There is no pitting edema in the distal lower extremities bilaterally. Pedal pulses are intact.  Skin: Warm and dry without trophic changes noted. There are no varicose veins.  Musculoskeletal: exam reveals no obvious joint deformities, tenderness or joint swelling or erythema.   Neurologically:  Mental status: The patient is awake, alert and oriented in all 4 spheres. Her memory, attention, language and knowledge are appropriate. There is no aphasia, agnosia, apraxia or anomia. Speech is clear with normal prosody and enunciation. Thought process is linear. Mood is congruent and affect is normal.  Cranial nerves are as described above under HEENT exam. In addition, shoulder shrug is normal with equal shoulder height noted. Motor exam: Normal bulk, strength and tone is noted. There is no drift, tremor or rebound. Romberg is negative. Reflexes are 2+ throughout. Toes are downgoing bilaterally. Fine motor skills are intact with normal finger taps, normal hand movements, normal rapid alternating patting, normal foot taps and normal foot agility.  Cerebellar testing shows no dysmetria or intention  tremor on finger to nose testing. Heel to shin is unremarkable bilaterally. There is no truncal or gait ataxia.  Sensory exam is intact to light touch, pinprick, vibration, temperature sense in the upper and lower extremities.  Gait, station and balance are unremarkable. No veering to one side is noted. No leaning to one side is noted. Posture is age-appropriate and stance is narrow based. No problems turning are noted. She turns en bloc. Tandem walk is unremarkable. Intact toe and heel stance is noted.               Assessment and Plan:   Assessment and Plan:  In summary, Monique Shaw is a very pleasant 32 y.o.-year old female with a history of snoring, EDS and recurrent headaches. Her physical exam is fairly non-focal, but based on her obesity, her neck size, tighter airway and snoring with EDS, she is at risk for OSA. She has been advised about my findings and the diagnosis of OSA, the prognosis and treatment options, as well as risks of untreated OSA. We talked about medical treatments and non-pharmacological approaches. We talked about maintaining a healthy lifestyle in general. I encouraged the patient to eat healthy, exercise daily and keep well hydrated, to keep a scheduled bedtime and wake time routine, and to pursue wt loss with exercise and diet. We also talked about her migraines without aura for which she has been taking as needed Imitrex and Phenergan.  As far as further diagnostic testing is concerned, I suggested the following today: sleep study and MRI of brain.  I answered all her questions today and the patient was in agreement with the above outlined plan. I would like to see the patient back after these tests.   Thank you very much for allowing me to participate in the care of this nice patient. If I can be of any further assistance to you please do not hesitate to call me at [phone].  Sincerely,   Huston Foley, MD, PhD

## 2012-07-24 NOTE — Patient Instructions (Addendum)
Based on your symptoms and your exam I believe you are at risk for obstructive sleep apnea or OSA, and I think we should proceed with a sleep study to determine whether you do or do not have OSA and how severe it is. If you have more than mild OSA, I want you to consider treatment with CPAP. Please remember, the risks and ramifications of moderate to severe obstructive sleep apnea or OSA are: Cardiovascular disease, including congestive heart failure, stroke, difficult to control hypertension, arrhythmias, and even type 2 diabetes has been linked to untreated OSA. Sleep apnea causes disruption of sleep and sleep deprivation in most cases, which, in turn, can cause recurrent headaches, problems with memory, mood, concentration, focus, and vigilance. Most people with untreated sleep apnea report excessive daytime sleepiness, which can affect their ability to drive. Please do not drive if you feel sleepy.   I would like to do a brain MRI as well.   I will see you back after the tests.

## 2012-07-30 NOTE — Telephone Encounter (Signed)
refill 

## 2012-08-01 ENCOUNTER — Telehealth: Payer: Self-pay | Admitting: Neurology

## 2012-08-01 NOTE — Telephone Encounter (Signed)
Please call and notify the patient that the recent sleep study did confirm the diagnosis of obstructive sleep apnea, but it is mild. I do recommend going over the results during an appointment. If she has had her MRI, please schedule an appt for FU - AFTER her MRI brain is done, thx Huston Foley, MD, PhD [facility] ([facility])

## 2012-08-05 NOTE — Telephone Encounter (Signed)
Pt notified of sleep study results.  A follow up appointment to discuss sleep and MRI results has been scheduled on 08/12/2012.  A copy of the report was faxed to Dr. Felipa Eth and mailed to the pt's home address...[place]

## 2012-08-07 DIAGNOSIS — R51 Headache: Secondary | ICD-10-CM

## 2012-08-08 ENCOUNTER — Other Ambulatory Visit: Payer: Self-pay | Admitting: Neurology

## 2012-08-08 DIAGNOSIS — G43009 Migraine without aura, not intractable, without status migrainosus: Secondary | ICD-10-CM

## 2012-08-12 ENCOUNTER — Institutional Professional Consult (permissible substitution): Payer: BC Managed Care – PPO | Admitting: Neurology

## 2012-08-12 NOTE — Progress Notes (Signed)
Quick Note:  Spoke with patient and relayed the results of their MRI as well as Dr Athar's advise or recommendations. The patient was also reminded of any future appointments. The patient understood and had no questions.  ______ 

## 2012-08-12 NOTE — Progress Notes (Signed)
Quick Note:  Please call and advise the patient that the recent scan we did was within normal limits. We did a brain MRI without contrast, which showed normal findings. In particular, there were no acute findings, such as a stroke, or mass or blood products. No further action is required on this test at this time. Please remind patient to keep any upcoming appointments or tests and to call us with any interim questions, concerns, problems or updates. Thanks,  Illianna Paschal, MD, PhD   ______ 

## 2012-08-13 ENCOUNTER — Ambulatory Visit (INDEPENDENT_AMBULATORY_CARE_PROVIDER_SITE_OTHER): Payer: BC Managed Care – PPO | Admitting: Neurology

## 2012-08-13 ENCOUNTER — Ambulatory Visit (INDEPENDENT_AMBULATORY_CARE_PROVIDER_SITE_OTHER): Payer: BC Managed Care – PPO | Admitting: *Deleted

## 2012-08-13 ENCOUNTER — Encounter: Payer: Self-pay | Admitting: Internal Medicine

## 2012-08-13 ENCOUNTER — Ambulatory Visit: Payer: BC Managed Care – PPO | Admitting: Internal Medicine

## 2012-08-13 ENCOUNTER — Encounter: Payer: Self-pay | Admitting: Neurology

## 2012-08-13 ENCOUNTER — Ambulatory Visit (INDEPENDENT_AMBULATORY_CARE_PROVIDER_SITE_OTHER): Payer: BC Managed Care – PPO | Admitting: Internal Medicine

## 2012-08-13 VITALS — BP 128/88 | HR 81 | Temp 97.6°F | Ht 69.0 in | Wt 290.0 lb

## 2012-08-13 VITALS — BP 136/88 | HR 85 | Temp 98.1°F | Resp 18 | Wt 289.0 lb

## 2012-08-13 DIAGNOSIS — E785 Hyperlipidemia, unspecified: Secondary | ICD-10-CM

## 2012-08-13 DIAGNOSIS — G4733 Obstructive sleep apnea (adult) (pediatric): Secondary | ICD-10-CM

## 2012-08-13 DIAGNOSIS — F329 Major depressive disorder, single episode, unspecified: Secondary | ICD-10-CM

## 2012-08-13 DIAGNOSIS — Z0289 Encounter for other administrative examinations: Secondary | ICD-10-CM

## 2012-08-13 DIAGNOSIS — E559 Vitamin D deficiency, unspecified: Secondary | ICD-10-CM

## 2012-08-13 DIAGNOSIS — G43009 Migraine without aura, not intractable, without status migrainosus: Secondary | ICD-10-CM

## 2012-08-13 MED ORDER — PROMETHAZINE HCL 25 MG PO TABS: 25.0000 mg | ORAL_TABLET | Freq: Four times a day (QID) | ORAL | Status: DC | PRN

## 2012-08-13 NOTE — Progress Notes (Signed)
Pt arrives at sleep lab for CPAP mask fitting and desensitization due to: FEELING OVERWHELMED AND NERVOUS TO HAVE CPAP TITRATION STUDY, PT WAS SCHEDULED FOR SPLIT NIGHT BUT IT COULD NOT OCCUR AS PT DIDN'T MEET CRITERIA TO SPLIT, SHE STATED CPAP MASK FIT LEFT HER FEELING UNSURE.    OUR GOAL TODAY WAS TO GIVE HER SOME REASSURANCE AND ALLOW HER TO WEAR THE MASK WITH PRESSURE FOR A PERIOD OF TIME TO ALLOW HER TO GET USED TO THE AIR PRESSURE - SHE DID FANTASTIC!    CPAP Masks tried:  RESPIRONICS WISP SIZE SM/MD, RESMED AIRFIT N10 SIZE STANDARD - PT STATED SHE PREFERRED THE NASAL MASK, SHE DID TRY A NASAL PILLOW IN THE LAB BUT FELT LIKE THE PRESSURE WAS MUCH STRONGER WITH IT.  CPAP Masks preferred:  RESPIRONICS WISP SIZE SM/MD  Desensitization needs:  WE DISCUSSED TIPS FOR KEEPING HER MOUTH CLOSED, IMPORTANCE OF COMMUNICATING ANY NEEDS TO THE TECH ON THE NIGHT OF HER STUDY SO THEY CAN HELP HER ADJUST AND HAVE THE BEST TITRATION POSSIBLE.  PT ALSO KNOWS WHAT TO EXPECT AFTER THE STUDY IN REGARDS TO STARTING A CPAP TRIAL AT HOME AND FOLLOW UP WITH DR. Frances Furbish. -SH

## 2012-08-13 NOTE — Progress Notes (Signed)
Subjective:    Patient ID: Monique Shaw is a 32 y.o. female.  HPI  Interim history:   [name] is a very pleasant 32 year old right-handed woman who presents for followup consultation after a recent sleep study and MRI brain. I had first met her on 07/24/2012 at the request of Dr. Felipa Eth for complaint of daytime somnolence and nonrestorative sleep. She has an underlying history of allergies, depression, obesity, hypertension, migraine headaches and anxiety. I suggested a sleep study which she had on 07/24/2012. I went over her test results with her in detail today. Her sleep efficiency was reduced at 78% with a normal sleep latency of 13.5 minutes. She had an increased amount of wake after sleep onset at 78.5 minutes with moderate sleep fragmentation noted. She had an increased percentage of stage II sleep, near absence of slow wave sleep and absence of REM sleep. She had mild to moderate snoring. Her AHI was 6.6 per hour, rising to 10.8 per hour in the supine position. Her baseline oxygen saturation was 94%, her nadir was 90%. I explained to her that the absence of REM sleep may underestimate her underlying obstructive sleep apnea. Her REM sleep may have been suppressed due to medication effect, particularly Cymbalta and Xanax. Benzodiazepines tend to increase stage II sleep. Her brain MRI without contrast from 08/07/2012 was reported as normal. She reports no changes in her symptoms since she was last seen by me earlier this month. She recently discontinued her Cymbalta d/t thinning hair. Previously she was on Lexapro, which caused wt gain. She reports no recent illness, but has been getting migrainous HAs, for which Excedrine migraine helps.   Her Past Medical History Is Significant For: Past Medical History  Diagnosis Date  . Allergy   . Depression   . H/O toxoplasmosis   . Rubella   . H/O varicella   . Blood type, Rh negative   . Obese   . Irregular menstrual cycle  2004  . Weight gain 2004  . Hx: UTI (urinary tract infection) 2007  . ASCVD (arteriosclerotic cardiovascular disease) 2011  . Hypertension   . History of elevated lipids 2011  . H/O migraine 10/25/10  . Anxiety     Her Past Surgical History Is Significant For: Past Surgical History  Procedure Laterality Date  . Wisdom tooth extraction      Her Family History Is Significant For: Family History  Problem Relation Age of Onset  . Heart disease Mother   . Hypertension Mother   . Cancer Mother   . Hypertension Father   . Anemia Father   . Cancer Maternal Uncle   . Cancer Paternal Aunt   . Alcohol abuse Paternal Uncle   . Hypertension Maternal Grandmother   . Heart disease Paternal Grandfather     MI    Her Social History Is Significant For: History   Social History  . Marital Status: Married    Spouse Name: N/A    Number of Children: 1  . Years of Education: M. ED   Occupational History  .     Social History Main Topics  . Smoking status: Never Smoker   . Smokeless tobacco: Never Used  . Alcohol Use: No  . Drug Use: No  . Sexually Active: Yes    Birth Control/ Protection: Pill     Comment: ALESSE   Other Topics Concern  . None   Social History Narrative  . None    Her Allergies Are:  No  Known Allergies:   Her Current Medications Are:  Outpatient Encounter Prescriptions as of 08/13/2012  Medication Sig Dispense Refill  . ALPRAZolam (XANAX) 1 MG tablet Take 1 mg by mouth at bedtime as needed for sleep.      . Diethylpropion HCl (TENUATE PO) Take 25 mg by mouth daily.      . DULoxetine (CYMBALTA) 20 MG capsule Take 60 mg by mouth daily.       . ergocalciferol (VITAMIN D2) 50000 UNITS capsule Take 50,000 Units by mouth once a week.      Marland Kitchen levonorgestrel-ethinyl estradiol (AVIANE,ALESSE,LESSINA) 0.1-20 MG-MCG tablet Take 1 tablet by mouth daily.  1 Package  11  . promethazine (PHENERGAN) 25 MG tablet Take 1 tablet (25 mg total) by mouth every 6 (six) hours as  needed for nausea.  30 tablet  0  . SUMAtriptan (IMITREX) 50 MG tablet Take 1 tablet (50 mg total) by mouth every 2 (two) hours as needed.  10 tablet  2  . [DISCONTINUED] promethazine (PHENERGAN) 25 MG tablet Take 25 mg by mouth every 6 (six) hours as needed for nausea.       No facility-administered encounter medications on file as of 08/13/2012.   Review of Systems  Constitutional: Positive for fatigue and unexpected weight change.  Respiratory:       Snoring  Hematological: Bruises/bleeds easily.    Objective:  Neurologic Exam  Physical Exam Physical Examination:   Filed Vitals:   08/13/12 0955  BP: 128/88  Pulse: 81  Temp: 97.6 F (36.4 C)    General Examination: The patient is a very pleasant 32 y.o. female in no acute distress. She appears well-developed and well-nourished and well groomed. She is obese.  HEENT: Normocephalic, atraumatic, pupils are equal, round and reactive to light and accommodation. Funduscopic exam is normal with sharp disc margins noted. Extraocular tracking is good without limitation to gaze excursion or nystagmus noted. Normal smooth pursuit is noted. Hearing is grossly intact. Face is symmetric with normal facial animation and normal facial sensation. Speech is clear with no dysarthria noted. There is no hypophonia. There is no lip, neck/head, jaw or voice tremor. Neck is supple with full range of passive and active motion. There are no carotid bruits on auscultation. Oropharynx exam reveals: mild mouth dryness, good dental hygiene and mild airway crowding, due to larger tongue and wider based uvula. Mallampati is class I. Tongue protrudes centrally and palate elevates symmetrically.   Chest: Clear to auscultation without wheezing, rhonchi or crackles noted.  Heart: S1+S2+0, regular and normal without murmurs, rubs or gallops noted.   Abdomen: Soft, non-tender and non-distended with normal bowel sounds appreciated on auscultation.  Extremities: There  is no pitting edema in the distal lower extremities bilaterally.   Skin: Warm and dry without trophic changes noted. There are no varicose veins.  Musculoskeletal: exam reveals no obvious joint deformities, tenderness or joint swelling or erythema.   Neurologically:  Mental status: The patient is awake, alert and oriented in all 4 spheres. Her memory, attention, language and knowledge are appropriate. There is no aphasia, agnosia, apraxia or anomia. Speech is clear with normal prosody and enunciation. Thought process is linear. Mood is congruent and affect is normal.  Cranial nerves are as described above under HEENT exam. In addition, shoulder shrug is normal with equal shoulder height noted. Motor exam: Normal bulk, strength and tone is noted. There is no drift, tremor or rebound. Romberg is negative. Reflexes are 2+ throughout. Fine motor skills are  intact with normal finger taps, normal hand movements, normal rapid alternating patting, normal foot taps and normal foot agility.  Cerebellar testing shows no dysmetria or intention tremor on finger to nose testing. Heel to shin is unremarkable bilaterally. There is no truncal or gait ataxia.  Sensory exam is intact to light touch, pinprick, vibration, temperature sense in the upper and lower extremities.  Gait, station and balance are unremarkable. No veering to one side is noted. No leaning to one side is noted. Posture is age-appropriate and stance is narrow based. No problems turning are noted. She turns en bloc. Tandem walk is unremarkable. Intact toe and heel stance is noted.               Assessment and Plan:   In summary, Monique Shaw is a very pleasant 32 y.o.-year old female with a history of recurrent headaches which are mostly migrainous in nature and recent confirmation of obstructive sleep apnea. I do believe that there was an underestimation of her true OSA due to absence of REM sleep during the study. Since then she has  discontinued Cymbalta and is currently not on any antidepressant. She has been experiencing recurrent headaches which are responsive to Imitrex or Excedrin migraine. She has associated nausea for which Phenergan has been helpful and I renewed her prescription for that. She did not need any refills on the Imitrex. I talked to her about treatment of obstructive sleep apnea and in particular the use of CPAP. She is willing to give this a try. To that end I would like for her to come back for a full night CPAP titration study for proper mask fitting and pressure determination. The hope is that with treatment of her OSA she will Shaw an improvement in her recurrent headaches and some of the other symptoms she has been experiencing in the context of her fibromyalgia. She is in agreement. I will see her back after the CPAP studies completed and she has had a chance to try CPAP in the home setting. I answered all her questions today and gave her a prescription for Phenergan.

## 2012-08-13 NOTE — Patient Instructions (Addendum)
See me as needed 

## 2012-08-13 NOTE — Progress Notes (Signed)
Subjective:    Patient ID: Monique Shaw, female    DOB: 1980/09/01, 32 y.o.   MRN: [id number]  HPI  Monique Shaw  Is here for follow up.  She has seen the neurologist Dr. Frances Furbish and Dr. Sharmon Revere.  She has OSA and is being titrated for her CPAP.  She reports  the rheumatologist was equivocal about a diagnosis of fiboromyalgia but Dr. Herma Carson did suggest a trial of Cymbalta.  Dr. Evelene Croon prescribed Cymbalta for pt but pt could not tolerate  She tells me her fatigue is improving.  Free thyrooid levels normal  She has almost completed her high dose vitamind D  She receives her GYN care at [facility] [place]    No Known Allergies Past Medical History  Diagnosis Date  . Allergy   . Depression   . H/O toxoplasmosis   . Rubella   . H/O varicella   . Blood type, Rh negative   . Obese   . Irregular menstrual cycle 2004  . Weight gain 2004  . Hx: UTI (urinary tract infection) 2007  . ASCVD (arteriosclerotic cardiovascular disease) 2011  . Hypertension   . History of elevated lipids 2011  . H/O migraine 10/25/10  . Anxiety   . Sleep apnea   . Migraine    Past Surgical History  Procedure Laterality Date  . Wisdom tooth extraction     History   Social History  . Marital Status: Married    Spouse Name: N/A    Number of Children: 1  . Years of Education: M. ED   Occupational History  .     Social History Main Topics  . Smoking status: Never Smoker   . Smokeless tobacco: Never Used  . Alcohol Use: No  . Drug Use: No  . Sexually Active: Yes    Birth Control/ Protection: Pill     Comment: ALESSE   Other Topics Concern  . Not on file   Social History Narrative  . No narrative on file   Family History  Problem Relation Age of Onset  . Heart disease Mother   . Hypertension Mother   . Cancer Mother   . Hypertension Father   . Anemia Father   . Cancer Maternal Uncle   . Cancer Paternal Aunt   . Alcohol abuse Paternal Uncle   . Hypertension Maternal Grandmother   .  Heart disease Paternal Grandfather     MI   Patient Active Problem List   Diagnosis Date Noted  . Obstructive sleep apnea (adult) (pediatric) 08/13/2012  . Anxiety state, unspecified 07/14/2012  . Depression 07/14/2012  . Other and unspecified hyperlipidemia 07/14/2012  . Headache(784.0) 07/14/2012  . Allergic rhinitis 07/14/2012  . Unspecified vitamin D deficiency 07/14/2012  . Family history of colon cancer 07/14/2012   Current Outpatient Prescriptions on File Prior to Visit  Medication Sig Dispense Refill  . ALPRAZolam (XANAX) 1 MG tablet Take 1 mg by mouth at bedtime as needed for sleep.      . ergocalciferol (VITAMIN D2) 50000 UNITS capsule Take 50,000 Units by mouth once a week.      Marland Kitchen levonorgestrel-ethinyl estradiol (AVIANE,ALESSE,LESSINA) 0.1-20 MG-MCG tablet Take 1 tablet by mouth daily.  1 Package  11  . promethazine (PHENERGAN) 25 MG tablet Take 1 tablet (25 mg total) by mouth every 6 (six) hours as needed for nausea.  30 tablet  0  . SUMAtriptan (IMITREX) 50 MG tablet Take 1 tablet (50 mg total) by mouth every 2 (two) hours as  needed.  10 tablet  2   No current facility-administered medications on file prior to visit.      Review of Systems See HPI    Objective:   Physical Exam Physical Exam  Nursing note and vitals reviewed.   Repeat BP is normal Constitutional: She is oriented to person, place, and time. She appears well-developed and well-nourished.  HENT:  Head: Normocephalic and atraumatic.  Cardiovascular: Normal rate and regular rhythm. Exam reveals no gallop and no friction rub.  No murmur heard.  Pulmonary/Chest: Breath sounds normal. She has no wheezes. She has no rales.  Neurological: She is alert and oriented to person, place, and time.  Skin: Skin is warm and dry.  Psychiatric: She has a normal mood and affect. Her behavior is normal.             Assessment & Plan:  Vitamin D deficiency  Will recheck level today Once she has finished  prescribed dose she is to take 2000 units of Vitamin D OTC.    Fatigue/?? Fibromyalgia  Unable to tolerate Cymbalta  Symptom improving.    OSA  inititating CPAP treatment  Depression  Continue current meds  Hyperlipidemia  DASH diet

## 2012-08-13 NOTE — Patient Instructions (Addendum)
I do think you would benefit from treatment of your sleep apnea with CPAP. This will require another sleep study for proper titration and mask fitting. We will schedule him for CPAP titration study. I will see her back after you have had a chance to try CPAP at home for a few weeks. The hope is, that with better sleep, your dizziness and headaches will improve as well.   Please remember, common headache triggers are: sleep deprivation, dehydration, overheating, stress, hypoglycemia or skipping meals, excessive pain medications or excessive alcohol use. Some people have food triggers such as aged cheese, orange juice or chocolate, especially dark chocolate. Try to avoid these headache triggers as much possible. It may be helpful to keep a headache diary to figure out what makes your headaches worse or brings them on. Some people report headache onset after exercise but studies have shown that regular exercise may actually prevent headaches from coming on. If you have exercise-induced headaches, please make sure that you drink plenty of fluid before and after exercising and that you don't over do it.

## 2012-08-14 ENCOUNTER — Encounter: Payer: Self-pay | Admitting: *Deleted

## 2012-08-14 ENCOUNTER — Telehealth: Payer: Self-pay | Admitting: Neurology

## 2012-08-14 LAB — VITAMIN D 25 HYDROXY (VIT D DEFICIENCY, FRACTURES): Vit D, 25-Hydroxy: 72 ng/mL (ref 30–89)

## 2012-08-15 ENCOUNTER — Telehealth: Payer: Self-pay | Admitting: *Deleted

## 2012-08-15 NOTE — Telephone Encounter (Signed)
Explained to patient

## 2012-08-15 NOTE — Telephone Encounter (Signed)
Spoke to pt. She is concerned about work and driving until she has her CPAP, and her symptoms subside a little. She is requesting a note that explains she may miss work intermittently until she has her FMLA paperwork filled out. She is waiting to have FMLA paperwork filled out after her CPAP fitting on 08/28/2012. Pt had a dizzy episode while driving this morning that was frightening, and sparked her concern.

## 2012-08-15 NOTE — Telephone Encounter (Signed)
Please explained to patient that FMLA paperwork needs to be filled out primary care physician. Thanks.

## 2012-08-15 NOTE — Telephone Encounter (Signed)
Notified pt of Vit D levels and instructions given for OTC Vit D

## 2012-08-15 NOTE — Telephone Encounter (Signed)
Message copied by Mathews Robinsons on Fri Aug 15, 2012  8:29 AM ------      Message from: Raechel Chute D      Created: Thu Aug 14, 2012  7:50 AM       Karen Kitchens            Call pt and let her know that her vitamin D is now normal.  When finished with her RX  Get OTC vitamin D3 and take 2000 units daily            OK to mail labs to her ------

## 2012-08-18 ENCOUNTER — Telehealth: Payer: Self-pay | Admitting: Neurology

## 2012-08-28 ENCOUNTER — Ambulatory Visit (INDEPENDENT_AMBULATORY_CARE_PROVIDER_SITE_OTHER): Payer: BC Managed Care – PPO | Admitting: Neurology

## 2012-08-28 VITALS — BP 117/82

## 2012-08-28 DIAGNOSIS — G4730 Sleep apnea, unspecified: Secondary | ICD-10-CM

## 2012-08-28 DIAGNOSIS — G4733 Obstructive sleep apnea (adult) (pediatric): Secondary | ICD-10-CM

## 2012-09-02 ENCOUNTER — Telehealth: Payer: Self-pay

## 2012-09-02 NOTE — Telephone Encounter (Signed)
I called patient and reviewed previous office notes regarding patient following up with primary physician to have FMLA forms completed. Patient states that she saw he primary last week and he said that because Dr. Frances Furbish diagnosed patient, she would be the one to complete the FMLA. Patient feels that the CPAP machine will be effective and is awaiting Dr. Johny Sax review of the results and follow up recommendations. I instructed patient to call and get follow up appointment and I would speak with Dr. Frances Furbish about FMLA forms when she returns next week. Patient remains out of work due to chronic fatigue and migraines.

## 2012-09-05 ENCOUNTER — Telehealth: Payer: Self-pay | Admitting: Neurology

## 2012-09-05 DIAGNOSIS — G4733 Obstructive sleep apnea (adult) (pediatric): Secondary | ICD-10-CM

## 2012-09-05 NOTE — Telephone Encounter (Signed)
Patient had very mild apnea under AHi 10 at baseline , but EDS,  Titrated to only 5 cm with complete resolution. Used a whisp mask. Order for DME placed. Please call with results.

## 2012-09-09 ENCOUNTER — Telehealth: Payer: Self-pay

## 2012-09-09 NOTE — Telephone Encounter (Signed)
I called the patient and informed her that Dr. Frances Furbish reviewed her forms. She does not feel comfortable signing short term disability forms for migraines and mild sleep apnea. It is recommended that patient ask her primary care provider. Patient stated that he wopuld not sign them. She wanted to know under what circumstances she would sign for leave. I stated that she makes that decision on a case to case basis. I also let her know that I could not return her forms because they have our information on them.

## 2012-09-12 NOTE — Telephone Encounter (Signed)
Left message for patient regarding sleep study results, made pt aware of her diagnosis of OSA which was effectively treated with CPAP.  Explained the doctor wants her to start a CPAP trial at home and orders were being sent to a DME company who would call her within the next 7 days. Asked patient to call me back to discuss results and have questions answered if desired.  Also explained she was free to follow up with Dr. Frances Furbish in clinic to discuss results, let her know that Dr. Frances Furbish wants to see her 30 days after using CPAP therapy to ensure it is helping and her symptoms are resolving.  Explained that a copy of the sleep study was sent to referring physician and copy of study is coming to them in the mail.

## 2012-09-15 NOTE — Progress Notes (Signed)
See media tab for full report  

## 2012-09-18 ENCOUNTER — Encounter: Payer: Self-pay | Admitting: *Deleted

## 2012-09-23 ENCOUNTER — Encounter: Payer: Self-pay | Admitting: *Deleted

## 2012-11-05 ENCOUNTER — Ambulatory Visit (INDEPENDENT_AMBULATORY_CARE_PROVIDER_SITE_OTHER): Payer: BC Managed Care – PPO | Admitting: Family Medicine

## 2012-11-05 ENCOUNTER — Ambulatory Visit: Payer: BC Managed Care – PPO

## 2012-11-05 VITALS — BP 120/84 | HR 67 | Temp 98.9°F | Resp 16 | Ht 69.5 in | Wt 302.0 lb

## 2012-11-05 DIAGNOSIS — M25579 Pain in unspecified ankle and joints of unspecified foot: Secondary | ICD-10-CM

## 2012-11-05 DIAGNOSIS — M25572 Pain in left ankle and joints of left foot: Secondary | ICD-10-CM

## 2012-11-05 MED ORDER — MELOXICAM 15 MG PO TABS: 15.0000 mg | ORAL_TABLET | Freq: Every day | ORAL | Status: DC

## 2012-11-05 MED ORDER — TRAMADOL HCL 50 MG PO TABS: 50.0000 mg | ORAL_TABLET | Freq: Three times a day (TID) | ORAL | Status: DC | PRN

## 2012-11-05 NOTE — Patient Instructions (Addendum)
Wear supportive shoe wear daily and take your anti-inflammatory daily.  Whenever you are sitting down, try to keep the foot elevated.  Try to ice the foot 3-4 times daily for 20 - 30 minutes.  If you continue to have pain, follow-up with the podiatrist.  Ankle Pain Ankle pain is a common symptom. The bones, cartilage, tendons, and muscles of the ankle joint perform a lot of work each day. The ankle joint holds your body weight and allows you to move around. Ankle pain can occur on either side or back of 1 or both ankles. Ankle pain may be sharp and burning or dull and aching. There may be tenderness, stiffness, redness, or warmth around the ankle. The pain occurs more often when a person walks or puts pressure on the ankle. CAUSES  There are many reasons ankle pain can develop. It is important to work with your caregiver to identify the cause since many conditions can impact the bones, cartilage, muscles, and tendons. Causes for ankle pain include:  Injury, including a break (fracture), sprain, or strain often due to a fall, sports, or a high-impact activity.  Swelling (inflammation) of a tendon (tendonitis).  Achilles tendon rupture.  Ankle instability after repeated sprains and strains.  Poor foot alignment.  Pressure on a nerve (tarsal tunnel syndrome).  Arthritis in the ankle or the lining of the ankle.  Crystal formation in the ankle (gout or pseudogout). DIAGNOSIS  A diagnosis is based on your medical history, your symptoms, results of your physical exam, and results of diagnostic tests. Diagnostic tests may include X-ray exams or a computerized magnetic scan (magnetic resonance imaging, MRI). TREATMENT  Treatment will depend on the cause of your ankle pain and may include:  Keeping pressure off the ankle and limiting activities.  Using crutches or other walking support (a cane or brace).  Using rest, ice, compression, and elevation.  Participating in physical therapy or home  exercises.  Wearing shoe inserts or special shoes.  Losing weight.  Taking medications to reduce pain or swelling or receiving an injection.  Undergoing surgery. HOME CARE INSTRUCTIONS   Only take over-the-counter or prescription medicines for pain, discomfort, or fever as directed by your caregiver.  Put ice on the injured area.  Put ice in a plastic bag.  Place a towel between your skin and the bag.  Leave the ice on for 15-20 minutes at a time, 3-4 times a day.  Keep your leg raised (elevated) when possible to lessen swelling.  Avoid activities that cause ankle pain.  Follow specific exercises as directed by your caregiver.  Record how often you have ankle pain, the location of the pain, and what it feels like. This information may be helpful to you and your caregiver.  Ask your caregiver about returning to work or sports and whether you should drive.  Follow up with your caregiver for further examination, therapy, or testing as directed. SEEK MEDICAL CARE IF:   Pain or swelling continues or worsens beyond 1 week.  You have an oral temperature above 102 F (38.9 C).  You are feeling unwell or have chills.  You are having an increasingly difficult time with walking.  You have loss of sensation or other new symptoms.  You have questions or concerns. MAKE SURE YOU:   Understand these instructions.  Will watch your condition.  Will get help right away if you are not doing well or get worse. Document Released: 08/23/2009 Document Revised: 05/28/2011 Document Reviewed: 08/23/2009 [employer]  Patient Information 2014 [employer].  

## 2012-11-05 NOTE — Progress Notes (Signed)
Subjective:    Patient ID: Monique Shaw, female    DOB: [birth year], 32 y.o.   MRN: [id number]   Chief Complaint  Patient presents with  . Foot Pain    left ankle pain, 3 weeks    HPI   Stiffness and pain in left ankle and foot - getting worse especially today.  Just has been going on for about 3 weeks but much worse.  Stiffness gets much worse over the end of the day and has to limp.  Swelling worse on left.  Has tried ibuprofen which does help a little.  No h/o any prior injuries.  Getting worse.  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  Larey Seat last weekend chasing daughter in bowling alley so has bruise on left upper shin - just slipped as was at bowling alley  Past Medical History  Diagnosis Date  . Allergy   . Depression   . H/O toxoplasmosis   . Rubella   . H/O varicella   . Blood type, Rh negative   . Obese   . Irregular menstrual cycle 2004  . Weight gain 2004  . Hx: UTI (urinary tract infection) 2007  . ASCVD (arteriosclerotic cardiovascular disease) 2011  . Hypertension   . History of elevated lipids 2011  . H/O migraine 10/25/10  . Anxiety   . Sleep apnea   . Migraine    Current Outpatient Prescriptions on File Prior to Visit  Medication Sig Dispense Refill  . ALPRAZolam (XANAX) 1 MG tablet Take 1 mg by mouth at bedtime as needed for sleep.      Marland Kitchen amphetamine-dextroamphetamine (ADDERALL) 30 MG tablet Take 30 mg by mouth 2 (two) times daily.      . ergocalciferol (VITAMIN D2) 50000 UNITS capsule Take 50,000 Units by mouth once a week.      Marland Kitchen levonorgestrel-ethinyl estradiol (AVIANE,ALESSE,LESSINA) 0.1-20 MG-MCG tablet Take 1 tablet by mouth daily.  1 Package  11  . promethazine (PHENERGAN) 25 MG tablet Take 1 tablet (25 mg total) by mouth every 6 (six) hours as needed for nausea.  30 tablet  0  . SUMAtriptan (IMITREX) 50 MG  tablet Take 1 tablet (50 mg total) by mouth every 2 (two) hours as needed.  10 tablet  2   No current facility-administered medications on file prior to visit.   No Known Allergies  Review of Systems  Constitutional: Negative for fever, chills, activity change and unexpected weight change.  Musculoskeletal: Positive for joint swelling, arthralgias and gait problem. Negative for myalgias and back pain.  Skin: Negative for color change and rash.  Neurological: Negative for weakness and numbness.      BP 120/84  Pulse 67  Temp(Src) 98.9 F (37.2 C) (Oral)  Resp 16  Ht 5' 9.5" (1.765 m)  Wt 302 lb (136.986 kg)  BMI 43.97 kg/m2  SpO2 100% Objective:   Physical Exam  Constitutional: She is oriented to person, place, and time. She appears well-developed and well-nourished. No distress.  HENT:  Head: Normocephalic and atraumatic.  Right Ear: External ear normal.  Eyes: Conjunctivae are normal. No scleral icterus.  Pulmonary/Chest: Effort normal.  Musculoskeletal:       Left ankle: She exhibits swelling. She exhibits normal range of motion, no ecchymosis, no deformity, no laceration and normal pulse. Tenderness. CF ligament tenderness found. No lateral malleolus,  no medial malleolus, no AITFL, no posterior TFL, no head of 5th metatarsal and no proximal fibula tenderness found. Achilles tendon normal. Achilles tendon exhibits no pain, no defect and normal Thompson's test results.  Pain with squeeze test of mid foot.  Neurological: She is alert and oriented to person, place, and time.  Skin: Skin is warm and dry. She is not diaphoretic. No erythema.  Psychiatric: She has a normal mood and affect. Her behavior is normal.     [facility] reading (PRIMARY) by  Dr. Clelia Croft. Left foot and ankle:  No acute bony abnormality seen.  Assessment & Plan:  Pain in joint, ankle and foot, left - Plan: DG Foot Complete Left, DG Ankle Complete Left, Ambulatory referral to Podiatry Supportive shoe wear, ice,  elevate, daily NSAID x 1-2 wks. If pain continues, f/u w/ podiatry. Meds ordered this encounter  Medications  . meloxicam (MOBIC) 15 MG tablet    Sig: Take 1 tablet (15 mg total) by mouth daily.    Dispense:  30 tablet    Refill:  1  . traMADol (ULTRAM) 50 MG tablet    Sig: Take 1 tablet (50 mg total) by mouth every 8 (eight) hours as needed for pain.    Dispense:  30 tablet    Refill:  0

## 2012-11-10 NOTE — Progress Notes (Signed)
.. Subjective:    Monique Shaw is a 31y.o. female, P1001, who presents for an annual exam.  S/p SVD 09/16/10.  The patient reports:no complaints Contraception:oral contraceptives (estrogen/progesterone)  Last mammogram: not applicable  Last pap: approximate date 10/2010 and was normal  GC/Chlamydia cultures offered: declined  HIV/RPR/HbsAg offered: declined  HSV 1 and 2 glycoprotein offered: declined  Menstrual cycle regular and monthly: Yes  Menstrual flow normal: No:  Urinary symptoms: urinary urgency.  Normal bowel movements: Yes  Reports abuse at home: No:     History   Social History  . Marital Status: Married    Spouse Name: N/A    Number of Children: 1  . Years of Education: M. ED   Occupational History  .     Social History Main Topics  . Smoking status: Never Smoker   . Smokeless tobacco: Never Used  . Alcohol Use: No  . Drug Use: No  . Sexual Activity: Yes    Birth Control/ Protection: Pill     Comment: ALESSE   Other Topics Concern  . None   Social History Narrative  . None  .Marland KitchenNo Known Allergies .Marland Kitchen Past Medical History  Diagnosis Date  . Allergy   . Depression   . H/O toxoplasmosis   . Rubella   . H/O varicella   . Blood type, Rh negative   . Obese   . Irregular menstrual cycle 2004  . Weight gain 2004  . Hx: UTI (urinary tract infection) 2007  . ASCVD (arteriosclerotic cardiovascular disease) 2011  . Hypertension   . History of elevated lipids 2011  . H/O migraine 10/25/10  . Anxiety   . Sleep apnea   . Migraine   .Marland Kitchen Past Surgical History  Procedure Laterality Date  . Wisdom tooth extraction      Menstrual cycle:   LMP: Patient's last menstrual period was 10/17/2011.           Cycle: monthly  The following portions of the patient's history were reviewed and updated as appropriate: allergies, current medications, past family history, past medical history, past social history, past surgical history and problem  list.  Review of Systems Pertinent items are noted in HPI. Breast:Negative for breast lump,nipple discharge or nipple retraction Gastrointestinal: Negative for abdominal pain, change in bowel habits or rectal bleeding Urinary:negative   Objective:    BP 130/72  Ht 5\' 9"  (1.753 m)  Wt 287 lb (130.182 kg)  BMI 42.36 kg/m2  LMP 10/17/2011    Weight:  Wt Readings from Last 1 Encounters:  11/05/12 302 lb (136.986 kg)          BMI: Body mass index is 42.36 kg/(m^2).  General Appearance: Alert, appropriate appearance for age. No acute distress HEENT: Grossly normal Neck / Thyroid: Supple, no masses, nodes or enlargement Lungs: clear to auscultation bilaterally Back: No CVA tenderness Breast Exam: No dimpling, nipple retraction or discharge. No masses or nodes. and No masses or nodes.No dimpling, nipple retraction or discharge. Cardiovascular: Regular rate and rhythm.  Gastrointestinal: Soft, non-tender, no masses or organomegaly Pelvic Exam: Vulva and vagina appear normal. Bimanual exam reveals normal uterus and adnexa. Exam limited due to habitus Rectovaginal: not indicated Lymphatic Exam: Non-palpable nodes in neck, clavicular, axillary,  Skin: no rash or abnormalities Neurologic: Normal gait and speech, no tremor  Psychiatric: Alert and oriented, appropriate affect.   Wet Prep:not applicable Urinalysis:not applicable UPT: Not done   Assessment:    Normal gyn exam  morbidly obese P1001  Plan:    Mammogram due age 60 pap smear done, next pap due 1-2 yrs return annually or prn STD screening: declined Contraception:oral contraceptives (estrogen/progesterone)--no absolute contraindications; ACHES rev'd Rec'd healthy diet, wt loss/exercise; daily vit, kegels, sunscreen F/u w/ PCP r/e BP check, fasting lipid panel, DM screen this yr  C. Denny Levy, CNM Late entry from 11/02/11

## 2012-11-19 ENCOUNTER — Encounter: Payer: Self-pay | Admitting: Neurology

## 2012-11-19 NOTE — Progress Notes (Signed)
Quick Note:  I reviewed the patient's CPAP compliance data from 10/17/12 to 11/15/12, which is a total of 30 days, during which time the patient used CPAP every day except for 9 days. The average usage for all days was 3 hours and 35 minutes. The percent used days greater than 4 hours was 53%, indicating fair compliance. The residual AHI was 1.7 per hour, indicating an adequate treatment pressure of 5 cwp. I will review this data with the patient at the next visit, provide feedback and additional trouble shooting if need be.  Huston Foley, MD, PhD [facility] ([facility]   ______

## 2012-11-20 ENCOUNTER — Encounter: Payer: Self-pay | Admitting: Neurology

## 2012-11-21 ENCOUNTER — Encounter: Payer: Self-pay | Admitting: Neurology

## 2013-01-11 ENCOUNTER — Ambulatory Visit (INDEPENDENT_AMBULATORY_CARE_PROVIDER_SITE_OTHER): Payer: BC Managed Care – PPO | Admitting: Family Medicine

## 2013-01-11 VITALS — BP 110/70 | HR 93 | Temp 98.0°F | Resp 16 | Ht 69.0 in | Wt 293.0 lb

## 2013-01-11 DIAGNOSIS — R5381 Other malaise: Secondary | ICD-10-CM

## 2013-01-11 DIAGNOSIS — R350 Frequency of micturition: Secondary | ICD-10-CM

## 2013-01-11 DIAGNOSIS — R110 Nausea: Secondary | ICD-10-CM

## 2013-01-11 DIAGNOSIS — R6889 Other general symptoms and signs: Secondary | ICD-10-CM

## 2013-01-11 DIAGNOSIS — J020 Streptococcal pharyngitis: Secondary | ICD-10-CM

## 2013-01-11 DIAGNOSIS — R509 Fever, unspecified: Secondary | ICD-10-CM

## 2013-01-11 DIAGNOSIS — H9201 Otalgia, right ear: Secondary | ICD-10-CM

## 2013-01-11 DIAGNOSIS — H9209 Otalgia, unspecified ear: Secondary | ICD-10-CM

## 2013-01-11 LAB — POCT CBC
Granulocyte percent: 57.8 %G (ref 37–80)
HCT, POC: 46.9 % (ref 37.7–47.9)
Hemoglobin: 14.7 g/dL (ref 12.2–16.2)
Lymph, poc: 1.6 (ref 0.6–3.4)
MCH, POC: 30.4 pg (ref 27–31.2)
MCHC: 31.3 g/dL — AB (ref 31.8–35.4)
MCV: 97 fL (ref 80–97)
MID (cbc): 0.3 (ref 0–0.9)
MPV: 8.7 fL (ref 0–99.8)
POC Granulocyte: 2.6 (ref 2–6.9)
POC LYMPH PERCENT: 34.6 %L (ref 10–50)
POC MID %: 7.6 % (ref 0–12)
Platelet Count, POC: 228 10*3/uL (ref 142–424)
RBC: 4.83 M/uL (ref 4.04–5.48)
RDW, POC: 13.2 %
WBC: 4.5 10*3/uL — AB (ref 4.6–10.2)

## 2013-01-11 LAB — POCT URINALYSIS DIPSTICK
Bilirubin, UA: NEGATIVE
Blood, UA: NEGATIVE
Glucose, UA: NEGATIVE
Ketones, UA: NEGATIVE
Nitrite, UA: NEGATIVE
Protein, UA: NEGATIVE
Spec Grav, UA: 1.025
Urobilinogen, UA: 2
pH, UA: 6

## 2013-01-11 LAB — POCT UA - MICROSCOPIC ONLY
Casts, Ur, LPF, POC: NEGATIVE
Crystals, Ur, HPF, POC: NEGATIVE
Mucus, UA: NEGATIVE
RBC, urine, microscopic: NEGATIVE
Yeast, UA: NEGATIVE

## 2013-01-11 LAB — POCT INFLUENZA A/B
Influenza A, POC: NEGATIVE
Influenza B, POC: NEGATIVE

## 2013-01-11 LAB — POCT RAPID STREP A (OFFICE): Rapid Strep A Screen: POSITIVE — AB

## 2013-01-11 MED ORDER — ONDANSETRON 4 MG PO TBDP: 4.0000 mg | ORAL_TABLET | Freq: Three times a day (TID) | ORAL | Status: DC | PRN

## 2013-01-11 MED ORDER — ONDANSETRON 4 MG PO TBDP: 4.0000 mg | ORAL_TABLET | Freq: Once | ORAL | Status: DC

## 2013-01-11 MED ORDER — AMOXICILLIN 875 MG PO TABS: 875.0000 mg | ORAL_TABLET | Freq: Two times a day (BID) | ORAL | Status: DC

## 2013-01-11 NOTE — Patient Instructions (Signed)
Strep Throat  Strep throat is an infection of the throat caused by a bacteria named Streptococcus pyogenes. Your caregiver may call the infection streptococcal "tonsillitis" or "pharyngitis" depending on whether there are signs of inflammation in the tonsils or back of the throat. Strep throat is most common in children aged 33 33 years during the cold months of the year, but it can occur in people of any age during any season. This infection is spread from person to person (contagious) through coughing, sneezing, or other close contact.  SYMPTOMS   · Fever or chills.  · Painful, swollen, red tonsils or throat.  · Pain or difficulty when swallowing.  · White or yellow spots on the tonsils or throat.  · Swollen, tender lymph nodes or "glands" of the neck or under the jaw.  · Red rash all over the body (rare).  DIAGNOSIS   Many different infections can cause the same symptoms. A test must be done to confirm the diagnosis so the right treatment can be given. A "rapid strep test" can help your caregiver make the diagnosis in a few minutes. If this test is not available, a light swab of the infected area can be used for a throat culture test. If a throat culture test is done, results are usually available in a day or two.  TREATMENT   Strep throat is treated with antibiotic medicine.  HOME CARE INSTRUCTIONS   · Gargle with 1 tsp of salt in 1 cup of warm water, 3 4 times per day or as needed for comfort.  · Family members who also have a sore throat or fever should be tested for strep throat and treated with antibiotics if they have the strep infection.  · Make sure everyone in your household washes their hands well.  · Do not share food, drinking cups, or personal items that could cause the infection to spread to others.  · You may need to eat a soft food diet until your sore throat gets better.  · Drink enough water and fluids to keep your urine clear or pale yellow. This will help prevent dehydration.  · Get plenty of  rest.  · Stay home from school, daycare, or work until you have been on antibiotics for 24 hours.  · Only take over-the-counter or prescription medicines for pain, discomfort, or fever as directed by your caregiver.  · If antibiotics are prescribed, take them as directed. Finish them even if you start to feel better.  SEEK MEDICAL CARE IF:   · The glands in your neck continue to enlarge.  · You develop a rash, cough, or earache.  · You cough up green, yellow-brown, or bloody sputum.  · You have pain or discomfort not controlled by medicines.  · Your problems seem to be getting worse rather than better.  SEEK IMMEDIATE MEDICAL CARE IF:   · You develop any new symptoms such as vomiting, severe headache, stiff or painful neck, chest pain, shortness of breath, or trouble swallowing.  · You develop severe throat pain, drooling, or changes in your voice.  · You develop swelling of the neck, or the skin on the neck becomes red and tender.  · You have a fever.  · You develop signs of dehydration, such as fatigue, dry mouth, and decreased urination.  · You become increasingly sleepy, or you cannot wake up completely.  Document Released: 03/02/2000 Document Revised: 02/20/2012 Document Reviewed: 05/04/2010  [employer]® Patient Information ©2014 [employer].

## 2013-01-11 NOTE — Progress Notes (Signed)
[facility]:  Office Visit  Chief Complaint:  Chief Complaint  Patient presents with   Otalgia    (R) ear x 1 day   Fatigue    Not feeling well    HPI: Monique Shaw is a 32 y.o. female who is here for right ear pain. She woke up with right ear pain yesterday. Says it is a throbbing pain and loud noises irritate it more. She has a complaint of fatigue, nausea, and muscle aches since Thursday. She states she has no energy and has trouble sleeping. She gets dizzy when she stands up since yesterday. Has headache on right side since yesterday. " General feeling of being unwell, nuscle pains on top of body" She does have sleep apnea. She has a habitat of taking her mask off during the night She does have OSA related migraines which she has bette. 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 also mono She has a son and husband with sinus infections No diarrhea, no rashes, decrease appetite + Nausea, increase urianry freq, + bilateral back pain   Past Medical History  Diagnosis Date   Allergy    Depression    H/O toxoplasmosis    Rubella    H/O varicella    Blood type, Rh negative    Obese    Irregular menstrual cycle 2004   Weight gain 2004   Hx: UTI (urinary tract infection) 2007   ASCVD (arteriosclerotic cardiovascular disease) 2011   Hypertension    History of elevated lipids 2011   H/O migraine 10/25/10   Anxiety    Sleep apnea    Migraine    Past Surgical History  Procedure Laterality Date   Wisdom tooth extraction     History   Social History   Marital Status: Married    Spouse Name: N/A    Number of Children: 1   Years of Education: M. ED   Occupational History       Social History Main Topics   Smoking status: Never Smoker    Smokeless tobacco: Never Used   Alcohol Use: No   Drug Use: No   Sexual Activity: Yes     Birth Tax adviser: Pill     Comment: ALESSE   Other Topics Concern   None   Social History Narrative   None   Family History  Problem Relation Age of Onset   Heart disease Mother    Hypertension Mother    Cancer Mother    Hypertension Father    Anemia Father    Cancer Maternal Uncle    Cancer Paternal Aunt    Alcohol abuse Paternal Uncle    Hypertension Maternal Grandmother    Heart disease Paternal Grandfather     MI   No Known Allergies Prior to Admission medications   Medication Sig Start Date End Date Taking? Authorizing Provider  ALPRAZolam Prudy Feeler) 0.25 MG tablet Take 0.25 mg by mouth at bedtime as needed for sleep.   Yes Historical Provider, MD  Amphetamine-Dextroamphetamine (ADDERALL PO) Take by mouth.   Yes Historical Provider, MD  Armodafinil (NUVIGIL) 150 MG tablet Take 150 mg by mouth daily.   Yes Historical Provider, MD  ergocalciferol (VITAMIN D2) 50000 UNITS capsule Take 50,000 Units by mouth once a week.   Yes Historical Provider, MD  levonorgestrel-ethinyl estradiol (AVIANE,ALESSE,LESSINA) 0.1-20 MG-MCG tablet Take 1 tablet by  mouth daily. 11/02/11  Yes Candice H Steelman, CNM  meloxicam (MOBIC) 15 MG tablet Take 1 tablet (15 mg total) by mouth daily. 11/05/12  Yes Sherren Mocha, MD  traMADol (ULTRAM) 50 MG tablet Take 1 tablet (50 mg total) by mouth every 8 (eight) hours as needed for pain. 11/05/12  Yes Sherren Mocha, MD     ROS: The patient denies , night sweats, unintentional weight loss, chest pain, palpitations, wheezing, dyspnea on exertion, , vomiting, abdominal pain, dysuria, hematuria, melena, numbness, , or tingling.  All other systems have been reviewed and were otherwise negative with the exception of those mentioned in the HPI and as above.    PHYSICAL EXAM: Filed Vitals:   01/11/13 0948  BP: 110/70  Pulse: 93  Temp: 98 F (36.7 C)  Resp: 16   Filed Vitals:   01/11/13 0948  Height: 5\' 9"  (1.753 m)  Weight: 293 lb  (132.904 kg)   Body mass index is 43.25 kg/(m^2).  General: Alert, no acute distress HEENT:  Normocephalic, atraumatic, oropharynx patent. EOMI, PERRLA, + erythematous throat, TM nl Cardiovascular:  Regular rate and rhythm, no rubs murmurs or gallops.  No Carotid bruits, radial pulse intact. No pedal edema.  Respiratory: Clear to auscultation bilaterally.  No wheezes, rales, or rhonchi.  No cyanosis, no use of accessory musculature GI: No organomegaly, abdomen is soft and non-tender, positive bowel sounds.  No masses. Skin: No rashes. Neurologic: Facial musculature symmetric. Psychiatric: Patient is appropriate throughout our interaction. Lymphatic: No cervical lymphadenopathy Musculoskeletal: Gait intact.   LABS: Results for orders placed in visit on 01/11/13  POCT CBC      Result Value Range   WBC 4.5 (*) 4.6 - 10.2 K/uL   Lymph, poc 1.6  0.6 - 3.4   POC LYMPH PERCENT 34.6  10 - 50 %L   MID (cbc) 0.3  0 - 0.9   POC MID % 7.6  0 - 12 %M   POC Granulocyte 2.6  2 - 6.9   Granulocyte percent 57.8  37 - 80 %G   RBC 4.83  4.04 - 5.48 M/uL   Hemoglobin 14.7  12.2 - 16.2 g/dL   HCT, POC 16.1  09.6 - 47.9 %   MCV 97.0  80 - 97 fL   MCH, POC 30.4  27 - 31.2 pg   MCHC 31.3 (*) 31.8 - 35.4 g/dL   RDW, POC 04.5     Platelet Count, POC 228  142 - 424 K/uL   MPV 8.7  0 - 99.8 fL  POCT UA - MICROSCOPIC ONLY      Result Value Range   WBC, Ur, HPF, POC 0-1     RBC, urine, microscopic negative     Bacteria, U Microscopic 1+     Mucus, UA negative     Epithelial cells, urine per micros 0-2     Crystals, Ur, HPF, POC negative     Casts, Ur, LPF, POC negative     Yeast, UA negative    POCT URINALYSIS DIPSTICK      Result Value Range   Color, UA yellow     Clarity, UA clear     Glucose, UA negative     Bilirubin, UA negative     Ketones, UA negative     Spec Grav, UA 1.025     Blood, UA negative     pH, UA 6.0     Protein, UA negative     Urobilinogen, UA 2.0  Nitrite, UA  negative     Leukocytes, UA Trace    POCT INFLUENZA A/B      Result Value Range   Influenza A, POC Negative     Influenza B, POC Negative    POCT RAPID STREP A (OFFICE)      Result Value Range   Rapid Strep A Screen Positive (*) Negative     EKG/XRAY:   Primary read interpreted by Dr. Conley Rolls at [facility].   ASSESSMENT/PLAN: Encounter Diagnoses  Name Primary?   Fever, unspecified Yes   Otalgia of right ear    Flu-like symptoms    Other malaise and fatigue    Increased frequency of urination    Rx Amoxacillin 875 mg Salt water gargles URine cx pending Work note written F/u prn Gross sideeffects, risk and benefits, and alternatives of medications d/w patient. Patient is aware that all medications have potential sideeffects and we are unable to predict every sideeffect or drug-drug interaction that may occur.  LE, THAO PHUONG, DO 01/11/2013 11:40 AM

## 2013-01-13 LAB — URINE CULTURE
Colony Count: NO GROWTH
Organism ID, Bacteria: NO GROWTH

## 2013-01-19 ENCOUNTER — Telehealth: Payer: Self-pay

## 2013-01-19 NOTE — Telephone Encounter (Signed)
See labs 

## 2013-01-19 NOTE — Telephone Encounter (Signed)
PT STATES THE LAB CALLED REGARDING HER RESULTS AND SHE MISSED THE CALL. PLEASE CALL [phone]

## 2013-02-22 ENCOUNTER — Emergency Department (HOSPITAL_BASED_OUTPATIENT_CLINIC_OR_DEPARTMENT_OTHER)
Admission: EM | Admit: 2013-02-22 | Discharge: 2013-02-22 | Disposition: A | Discharge status: Home / Self Care | Payer: BC Managed Care – PPO | Attending: Emergency Medicine | Admitting: Emergency Medicine

## 2013-02-22 ENCOUNTER — Emergency Department (HOSPITAL_BASED_OUTPATIENT_CLINIC_OR_DEPARTMENT_OTHER): Payer: BC Managed Care – PPO

## 2013-02-22 ENCOUNTER — Encounter (HOSPITAL_BASED_OUTPATIENT_CLINIC_OR_DEPARTMENT_OTHER): Payer: Self-pay | Admitting: Emergency Medicine

## 2013-02-22 DIAGNOSIS — F411 Generalized anxiety disorder: Secondary | ICD-10-CM | POA: Insufficient documentation

## 2013-02-22 DIAGNOSIS — J45909 Unspecified asthma, uncomplicated: Secondary | ICD-10-CM | POA: Insufficient documentation

## 2013-02-22 DIAGNOSIS — E669 Obesity, unspecified: Secondary | ICD-10-CM | POA: Insufficient documentation

## 2013-02-22 DIAGNOSIS — Z791 Long term (current) use of non-steroidal anti-inflammatories (NSAID): Secondary | ICD-10-CM | POA: Insufficient documentation

## 2013-02-22 DIAGNOSIS — R071 Chest pain on breathing: Secondary | ICD-10-CM | POA: Insufficient documentation

## 2013-02-22 DIAGNOSIS — Z792 Long term (current) use of antibiotics: Secondary | ICD-10-CM | POA: Insufficient documentation

## 2013-02-22 DIAGNOSIS — Z8619 Personal history of other infectious and parasitic diseases: Secondary | ICD-10-CM | POA: Insufficient documentation

## 2013-02-22 DIAGNOSIS — F329 Major depressive disorder, single episode, unspecified: Secondary | ICD-10-CM | POA: Insufficient documentation

## 2013-02-22 DIAGNOSIS — R0789 Other chest pain: Secondary | ICD-10-CM

## 2013-02-22 DIAGNOSIS — Z79899 Other long term (current) drug therapy: Secondary | ICD-10-CM | POA: Insufficient documentation

## 2013-02-22 DIAGNOSIS — F3289 Other specified depressive episodes: Secondary | ICD-10-CM | POA: Insufficient documentation

## 2013-02-22 DIAGNOSIS — G4730 Sleep apnea, unspecified: Secondary | ICD-10-CM | POA: Insufficient documentation

## 2013-02-22 DIAGNOSIS — I10 Essential (primary) hypertension: Secondary | ICD-10-CM | POA: Insufficient documentation

## 2013-02-22 DIAGNOSIS — Z8701 Personal history of pneumonia (recurrent): Secondary | ICD-10-CM | POA: Insufficient documentation

## 2013-02-22 DIAGNOSIS — G43909 Migraine, unspecified, not intractable, without status migrainosus: Secondary | ICD-10-CM | POA: Insufficient documentation

## 2013-02-22 DIAGNOSIS — Z87440 Personal history of urinary (tract) infections: Secondary | ICD-10-CM | POA: Insufficient documentation

## 2013-02-22 DIAGNOSIS — J069 Acute upper respiratory infection, unspecified: Secondary | ICD-10-CM

## 2013-02-22 DIAGNOSIS — Z8742 Personal history of other diseases of the female genital tract: Secondary | ICD-10-CM | POA: Insufficient documentation

## 2013-02-22 HISTORY — DX: Unspecified asthma, uncomplicated: J45.909

## 2013-02-22 MED ORDER — IBUPROFEN 800 MG PO TABS
800.0000 mg | ORAL_TABLET | Freq: Once | ORAL | Status: AC
  Administered 2013-02-22: 800 mg via ORAL
  Filled 2013-02-22: qty 1

## 2013-02-22 NOTE — ED Provider Notes (Signed)
[name]: [id number]     Arrival date & time 02/22/13  2002 History  This chart was scribed for Audree Camel, MD by Nicholos Johns, ED scribe. This patient was seen in room MH01/MH01 and the patient's care was started at 8:29 PM  Chief Complaint  Patient presents with  . Cough    The history is provided by the patient. No language interpreter was used.   HPI Comments: Frayda Egley Duke Salvia is a 32 y.o. female w/hx of pneumonia presents to the Emergency Department complaining of intermittent, unchanged productive cough that started today. Pt states she initially had an itchy, sore throat 4 days ago which is gradually improving. Pt reports having left, upper chest pain which is usually a result of violent cough spells, but has been constant for the last hour without coughing.  Pt reports nasal and chest congestion, postnasal drip, and rhinorrhea. Pt has taken Zyrtec with no relief. Pt denies fever, nausea, vomiting, back pain, abdominal pain, neck pain, or trouble swallowing.  Pt's LMNP was 1 week ago. Pt has potential sick contacts.  Past Medical History  Diagnosis Date  . Allergy   . Depression   . H/O toxoplasmosis   . Rubella   . H/O varicella   . Blood type, Rh negative   . Obese   . Irregular menstrual cycle 2004  . Weight gain 2004  . Hx: UTI (urinary tract infection) 2007  . ASCVD (arteriosclerotic cardiovascular disease) 2011  . Hypertension   . History of elevated lipids 2011  . H/O migraine 10/25/10  . Anxiety   . Sleep apnea   . Migraine   . Asthma    Past Surgical History  Procedure Laterality Date  . Wisdom tooth extraction     Family History  Problem Relation Age of Onset  . Heart disease Mother   . Hypertension Mother   . Cancer Mother   . Hypertension Father   . Anemia Father   . Cancer Maternal Uncle   . Cancer Paternal Aunt   . Alcohol abuse Paternal Uncle   . Hypertension Maternal Grandmother   . Heart disease Paternal Grandfather     MI    History  Substance Use Topics  . Smoking status: Never Smoker   . Smokeless tobacco: Never Used  . Alcohol Use: No   OB History   Grav Para Term Preterm Abortions TAB SAB Ect Mult Living   1 1 1       1      Review of Systems  Constitutional: Negative for fever.  HENT: Positive for congestion (neck and chest), postnasal drip, rhinorrhea and sore throat. Negative for trouble swallowing.   Respiratory: Positive for cough.   Cardiovascular: Positive for chest pain.  Gastrointestinal: Negative for nausea, vomiting, abdominal pain and diarrhea.  Musculoskeletal: Negative for back pain and neck pain.  All other systems reviewed and are negative.    Allergies  Review of patient's allergies indicates no known allergies.  Home Medications   Current Outpatient Rx  Name  Route  Sig  Dispense  Refill  . ALPRAZolam (XANAX) 0.25 MG tablet   Oral   Take 0.25 mg by mouth at bedtime as needed for sleep.         . Amphetamine-Dextroamphetamine (ADDERALL PO)   Oral   Take by mouth.         . Armodafinil (NUVIGIL) 150 MG tablet   Oral   Take 150 mg by mouth daily.         Marland Kitchen  ergocalciferol (VITAMIN D2) 50000 UNITS capsule   Oral   Take 50,000 Units by mouth once a week.         Marland Kitchen levonorgestrel-ethinyl estradiol (AVIANE,ALESSE,LESSINA) 0.1-20 MG-MCG tablet   Oral   Take 1 tablet by mouth daily.   1 Package   11   . ondansetron (ZOFRAN ODT) 4 MG disintegrating tablet   Oral   Take 1 tablet (4 mg total) by mouth every 8 (eight) hours as needed for nausea.   20 tablet   0   . orlistat (XENICAL) 120 MG capsule   Oral   Take 120 mg by mouth 3 (three) times daily with meals.         Marland Kitchen amoxicillin (AMOXIL) 875 MG tablet   Oral   Take 1 tablet (875 mg total) by mouth 2 (two) times daily.   20 tablet   0   . meloxicam (MOBIC) 15 MG tablet   Oral   Take 1 tablet (15 mg total) by mouth daily.   30 tablet   1   . traMADol (ULTRAM) 50 MG tablet   Oral   Take 1  tablet (50 mg total) by mouth every 8 (eight) hours as needed for pain.   30 tablet   0    Triage Vitals: BP 113/81  Pulse 83  Temp(Src) 98.7 F (37.1 C) (Oral)  Resp 18  Ht 5\' 9"  (1.753 m)  Wt 285 lb (129.275 kg)  BMI 42.07 kg/m2  SpO2 100%  LMP 02/18/2013 Physical Exam  Nursing note and vitals reviewed. Constitutional: She is oriented to person, place, and time. She appears well-developed and well-nourished.  HENT:  Head: Normocephalic and atraumatic.  Mouth/Throat: Oropharynx is clear and moist. No oropharyngeal exudate, posterior oropharyngeal edema or posterior oropharyngeal erythema.  Eyes: Conjunctivae and EOM are normal.  Neck: Normal range of motion.  Cardiovascular: Normal rate, regular rhythm and normal heart sounds.  Exam reveals no gallop and no friction rub.   No murmur heard. Pulmonary/Chest: Effort normal and breath sounds normal. No respiratory distress. She has no wheezes. She has no rales. She exhibits no tenderness.  Abdominal: Soft. She exhibits no distension and no mass. There is no tenderness. There is no rebound and no guarding.  Musculoskeletal: Normal range of motion. She exhibits no tenderness.  Lymphadenopathy:    She has no cervical adenopathy.  Neurological: She is oriented to person, place, and time. She has normal reflexes.  Skin: Skin is warm and dry.  Psychiatric: She has a normal mood and affect. Her behavior is normal.    ED Course  Procedures  DIAGNOSTIC STUDIES: Oxygen Saturation is 100% on room air, normal by my interpretation.    COORDINATION OF CARE: At 8:29 PM Discussed treatment plan with patient which includes CXR. Patient agrees.   Labs Review Labs Reviewed - No data to display Imaging Review Dg Chest 2 View  02/22/2013   CLINICAL DATA:  Chest pain and intermittent productive cough.  EXAM: CHEST  2 VIEW  COMPARISON:  None.  FINDINGS: The lungs are well-aerated and clear. There is no evidence of focal opacification, pleural  effusion or pneumothorax.  The heart is normal in size; the mediastinal contour is within normal limits. No acute osseous abnormalities are seen.  IMPRESSION: No acute cardiopulmonary process seen.   Electronically Signed   By: Roanna Raider M.D.   On: 02/22/2013 21:42    EKG Interpretation    Date/Time:  Sunday February 22 2013 20:59:42 EST Ventricular  Rate:  72 PR Interval:  154 QRS Duration: 100 QT Interval:  384 QTC Calculation: 420 R Axis:   3 Text Interpretation:  Normal sinus rhythm Incomplete right bundle branch block No acute ischemia No old tracing to compare Confirmed by Marlinda Miranda  MD, Sheniya Garciaperez ([id number]) on 02/22/2013 9:46:23 PM            MDM   1. Upper respiratory infection   2. Chest wall pain    Patient appears well, normal vitals. Sx c/w a viral URI with scratchy throat, congestion and cough. CP likely related to her persistent coughing, significantly improved with ibuprofen here. Will treat with symptomatic care and oral hydration at home.   I personally performed the services described in this documentation, which was scribed in my presence. The recorded information has been reviewed and is accurate.   Audree Camel, MD 02/23/13 1154

## 2013-02-22 NOTE — ED Notes (Signed)
C/o sore throat and cough since Wednesday. States had a bad coughing fit today and now has pain in her chest

## 2013-03-09 ENCOUNTER — Encounter (INDEPENDENT_AMBULATORY_CARE_PROVIDER_SITE_OTHER): Payer: Self-pay

## 2013-03-09 ENCOUNTER — Encounter: Payer: Self-pay | Admitting: Neurology

## 2013-03-09 ENCOUNTER — Ambulatory Visit (INDEPENDENT_AMBULATORY_CARE_PROVIDER_SITE_OTHER): Payer: BC Managed Care – PPO | Admitting: Neurology

## 2013-03-09 VITALS — BP 122/82 | HR 79 | Ht 69.0 in | Wt 289.0 lb

## 2013-03-09 DIAGNOSIS — G4733 Obstructive sleep apnea (adult) (pediatric): Secondary | ICD-10-CM

## 2013-03-09 DIAGNOSIS — G43009 Migraine without aura, not intractable, without status migrainosus: Secondary | ICD-10-CM

## 2013-03-09 NOTE — Progress Notes (Signed)
Subjective:    Patient ID: Monique Shaw is a 32 y.o. female.  HPI   Interim history:   Monique Shaw is a very pleasant 32 year old right-handed woman who presents for followup consultation of her recurrent migraine headaches and her obstructive sleep apnea. She is unaccompanied today. I last saw her on 08/13/2012, at which time I discussed her sleep study results with her from 07/24/2012. She is advised to consider using CPAP for treatment of her OSA. She had a CPAP titration study on 08/28/2012 and I went over her test results with her in detail today. Her sleep efficiency was normal at nearly 94%. She had no significant periodic leg movements of sleep. She had an increased percentage of REM sleep. She had a reduced REM latency. She had elimination of her sleep disordered breathing on a pressure of 5 cm and was started on CPAP based on the test results. I also reviewed her compliance data from 10/17/2012 through 11/15/2012 which is a total of 30 days during which time she is CPAP 21 days. Her percent used days greater than 4 hours was only 53%. She had a residual AHI of 1.7 per hour. Her average usage was 3 hours and 35 minutes only.  She recently presented to the emergency room on 02/22/2013 due to the upper respiratory symptoms. She had a chest x-ray which was negative. She was advised to treat symptomatically with oral rehydration and oral NSAIDs.  Today, she reports, using the CPAP more, she wants to try a different mask. Her HAs are better with CPAP. She has noted, that her HAs get worse with lack of CPAP. When she went on a trip for Thanksgiving, she took the machine, but forgot the power cord. She came back with a HA after 3 days of not using CPAP. I also reviewed her most recent 30 day compliance data from 02/07/2013 through 03/08/2013 during which time she uses CPAP only 17 nights. Her percent used days greater than 4 hours was only 23%. Her average usage was only 1 hour  and 54 minutes for all night and average usage for the days that she was on CPAP was 3 hours and 22 minutes. She had a residual AHI of 1.3 per hour with minimal leak. Her pressure is at 5 cm with no EPR. She reports a tendency to take off her mask in the middle of the night usually around 2:30 AM. She has been having difficulty maintaining sleep. Recently she has had some more readings at home because her 78-1/2-year-old boy has exhibited signs and symptoms of OSA. He has been scheduled for sleep study and may need a tonsillectomy and adenoidectomy. She worries about his sleep. This disrupts her sleep. She also was treated for nasal congestion and has had intermittent flareup of her allergy symptoms.  I had first met her on 07/24/2012 at the request of Dr. Felipa Eth for complaint of daytime somnolence and nonrestorative sleep. She has an underlying history of allergies, depression, obesity, hypertension, migraine headaches and anxiety. I suggested a sleep study which she had on 07/24/2012. Sleep efficiency was reduced at 78% with a normal sleep latency of 13.5 minutes. She had an increased amount of wake after sleep onset at 78.5 minutes with moderate sleep fragmentation noted. She had an increased percentage of stage II sleep, near absence of slow wave sleep and absence of REM sleep. She had mild to moderate snoring. Her AHI was 6.6 per hour, rising to 10.8 per hour in the supine  position. Her baseline oxygen saturation was 94%, her nadir was 90%. I explained to her that the absence of REM sleep may underestimate her underlying obstructive sleep apnea. Her REM sleep may have been suppressed due to medication effect, particularly Cymbalta and Xanax. Benzodiazepines tend to increase stage II sleep. Her brain MRI without contrast from 08/07/2012 was reported as normal.   Her Past Medical History Is Significant For: Past Medical History  Diagnosis Date  . Allergy   . Depression   . H/O toxoplasmosis   . Rubella    . H/O varicella   . Blood type, Rh negative   . Obese   . Irregular menstrual cycle 2004  . Weight gain 2004  . Hx: UTI (urinary tract infection) 2007  . ASCVD (arteriosclerotic cardiovascular disease) 2011  . Hypertension   . History of elevated lipids 2011  . H/O migraine 10/25/10  . Anxiety   . Sleep apnea   . Migraine   . Asthma     Her Past Surgical History Is Significant For: Past Surgical History  Procedure Laterality Date  . Wisdom tooth extraction      Her Family History Is Significant For: Family History  Problem Relation Age of Onset  . Heart disease Mother   . Hypertension Mother   . Cancer Mother   . Hypertension Father   . Anemia Father   . Cancer Maternal Uncle   . Cancer Paternal Aunt   . Alcohol abuse Paternal Uncle   . Hypertension Maternal Grandmother   . Heart disease Paternal Grandfather     MI    Her Social History Is Significant For: History   Social History  . Marital Status: Married    Spouse Name: Molly Maduro    Number of Children: 1  . Years of Education: M. ED   Occupational History  .      Teacher   Social History Main Topics  . Smoking status: Never Smoker   . Smokeless tobacco: Never Used  . Alcohol Use: No  . Drug Use: No  . Sexual Activity: Yes    Birth Control/ Protection: Pill     Comment: ALESSE   Other Topics Concern  . None   Social History Narrative   Pt lives at home with husband Molly Maduro) and son   Pt is right handed    Education- Masters   Caffeine- occasional soda or tea but not every day    Her Allergies Are:  No Known Allergies:   Her Current Medications Are:  Outpatient Encounter Prescriptions as of 03/09/2013  Medication Sig  . ALPRAZolam (XANAX) 0.25 MG tablet Take 0.25 mg by mouth at bedtime as needed for sleep.  . Amphetamine-Dextroamphetamine (ADDERALL PO) Take by mouth.  . Armodafinil (NUVIGIL) 150 MG tablet Take 150 mg by mouth daily.  . ergocalciferol (VITAMIN D2) 50000 UNITS capsule Take  50,000 Units by mouth once a week.  Marland Kitchen levonorgestrel-ethinyl estradiol (AVIANE,ALESSE,LESSINA) 0.1-20 MG-MCG tablet Take 1 tablet by mouth daily.  Marland Kitchen orlistat (XENICAL) 120 MG capsule Take 120 mg by mouth 3 (three) times daily with meals.  . traMADol (ULTRAM) 50 MG tablet Take 1 tablet (50 mg total) by mouth every 8 (eight) hours as needed for pain.  . [DISCONTINUED] amoxicillin (AMOXIL) 875 MG tablet Take 1 tablet (875 mg total) by mouth 2 (two) times daily.  . [DISCONTINUED] levonorgestrel-ethinyl estradiol (AVIANE,ALESSE,LESSINA) 0.1-20 MG-MCG tablet Take 1 tablet by mouth daily.  . [DISCONTINUED] meloxicam (MOBIC) 15 MG tablet Take 1 tablet (15  mg total) by mouth daily.  . [DISCONTINUED] ondansetron (ZOFRAN ODT) 4 MG disintegrating tablet Take 1 tablet (4 mg total) by mouth every 8 (eight) hours as needed for nausea.   Review of Systems:  Out of a complete 14 point review of systems, all are reviewed and negative with the exception of these symptoms as listed below:   Review of Systems  Constitutional: Positive for fatigue and unexpected weight change.  Eyes: Negative.   Cardiovascular: Negative.   Gastrointestinal: Negative.   Endocrine: Negative.   Genitourinary: Negative.   Musculoskeletal: Negative.   Skin: Negative.   Allergic/Immunologic: Negative.   Neurological: Positive for headaches.  Hematological: Negative.   Psychiatric/Behavioral: Positive for sleep disturbance.       Anxiety     Objective:  Neurologic Exam  Physical Exam Physical Examination:   Filed Vitals:   03/09/13 1006  BP: 122/82  Pulse: 79   General Examination: The patient is a very pleasant 32 y.o. female in no acute distress. She appears well-developed and well-nourished and well groomed. She is obese.   HEENT: Normocephalic, atraumatic, pupils are equal, round and reactive to light and accommodation. Funduscopic exam is normal with sharp disc margins noted. Extraocular tracking is good without  limitation to gaze excursion or nystagmus noted. Normal smooth pursuit is noted. Hearing is grossly intact. Face is symmetric with normal facial animation and normal facial sensation. Speech is clear with no dysarthria noted. There is no hypophonia. There is no lip, neck/head, jaw or voice tremor. Neck is supple with full range of passive and active motion. There are no carotid bruits on auscultation. Oropharynx exam reveals: mild mouth dryness, good dental hygiene and mild airway crowding, due to larger tongue and wider based uvula. Mallampati is class I. Tongue protrudes centrally and palate elevates symmetrically. Nasal inspection reveals mucosal erythema and bogginess.   Chest: Clear to auscultation without wheezing, rhonchi or crackles noted.  Heart: S1+S2+0, regular and normal without murmurs, rubs or gallops noted.   Abdomen: Soft, non-tender and non-distended with normal bowel sounds appreciated on auscultation.  Extremities: There is no pitting edema in the distal lower extremities bilaterally.   Skin: Warm and dry without trophic changes noted. There are no varicose veins.  Musculoskeletal: exam reveals no obvious joint deformities, tenderness or joint swelling or erythema.   Neurologically:  Mental status: The patient is awake, alert and oriented in all 4 spheres. Her memory, attention, language and knowledge are appropriate. There is no aphasia, agnosia, apraxia or anomia. Speech is clear with normal prosody and enunciation. Thought process is linear. Mood is congruent and affect is normal.  Cranial nerves are as described above under HEENT exam. In addition, shoulder shrug is normal with equal shoulder height noted. Motor exam: Normal bulk, strength and tone is noted. There is no drift, tremor or rebound. Romberg is negative. Reflexes are 2+ throughout. Fine motor skills are intact with normal finger taps, normal hand movements, normal rapid alternating patting, normal foot taps and  normal foot agility.  Cerebellar testing shows no dysmetria or intention tremor on finger to nose testing. There is no truncal or gait ataxia.  Sensory exam is intact to light touch, pinprick, vibration, temperature sense in the upper and lower extremities.  Gait, station and balance are unremarkable. No veering to one side is noted. No leaning to one side is noted. Posture is age-appropriate and stance is narrow based. No problems turning are noted. She turns en bloc. Tandem walk is unremarkable. Intact toe  and heel stance is noted.               Assessment and Plan:   In summary, Monique Shaw is a very pleasant 32 y.o.-year old female with a history of recurrent headaches, improved after treatment of her mild OSA with CPAP of 5 cwp. Her physical exam is stable and She indicates good results with the use of CPAP, and good tolerance of the pressure and mask, But would like to try a nasal pillows mask as opposed to a nasal mask. She also has flareup of nasal allergy symptoms for which I have encouraged her to use nasal saline rinses as well as restart her Zyrtec. She worries lately about her child sleep who may have significant obstructive sleep apnea and may need a tonsillectomy and adenoidectomy soon. This has been disrupting her sleep. I reviewed her sleep study report with the CPAP titration as well as her recent compliance data from August and last month in detail with her. I encouraged her to continue to use CPAP regularly to help reduce cardiovascular risk.   We also talked about trying to maintaining a healthy lifestyle in general. I encouraged the patient to eat healthy, exercise daily and keep well hydrated, to keep a scheduled bedtime and wake time routine, to not skip any meals and eat healthy snacks in between meals and to have protein with every meal. I stressed the importance of regular exercise and encouraged her to strive for weight loss. I answered all her  questions today and  the patient was in agreement with the above outlined plan. I would like to see the patient back in 6 months, sooner if the need arises and encouraged her  to call with any interim questions, concerns, problems or updates.  Most of my 25 minute visit today was spent in counseling and coordination of care, reviewing test results and reviewing medication.

## 2013-03-09 NOTE — Patient Instructions (Signed)
Please continue using your CPAP regularly. While your insurance requires that you use CPAP at least 4 hours each night on 70% of the nights, I recommend, that you not skip any nights and use it throughout the night if you can. Getting used to CPAP does take time and patience and discipline. Untreated obstructive sleep apnea when it is moderate to severe can have an adverse impact on cardiovascular health and raise her risk for heart disease, arrhythmias, hypertension, congestive heart failure, stroke and diabetes. Untreated obstructive sleep apnea causes sleep disruption, nonrestorative sleep, and sleep deprivation. This can have an impact on your day to day functioning and cause daytime sleepiness and impairment of cognitive function, memory loss, mood disturbance, and problems focussing. Using CPAP regularly can improve these symptoms.  We will try you on nasal pillows. You may need to use your Zyrtec again and use nasal saline also for soothing your nasal passages.   Please remember, common headache triggers are: sleep deprivation, dehydration, overheating, stress, hypoglycemia or skipping meals and blood sugar fluctuations, excessive pain medications or excessive alcohol use or caffeine withdrawal. Some people have food triggers such as aged cheese, orange juice or chocolate, especially dark chocolate, or MSG (monosodium glutamate). Try to avoid these headache triggers as much possible. It may be helpful to keep a headache diary to figure out what makes your headaches worse or brings them on and what alleviates them. Some people report headache onset after exercise but studies have shown that regular exercise may actually prevent headaches from coming. If you have exercise-induced headaches, please make sure that you drink plenty of fluid before and after exercising and that you do not over do it and do not overheat.

## 2013-03-16 ENCOUNTER — Encounter: Payer: Self-pay | Admitting: Neurology

## 2013-04-27 ENCOUNTER — Encounter: Payer: Self-pay | Admitting: Neurology

## 2013-04-30 NOTE — Progress Notes (Signed)
Quick Note:  I reviewed the patient's CPAP compliance data from 03/21/2013 to 04/19/2013, which is a total of 30 days, during which time the patient used CPAP only on 16 days. The average usage for all days was therefore low at 1 hour and 8 minutes. The percent used days greater than 4 hours was 7 %, indicating poor compliance. The residual AHI was 1.1 per hour, indicating an appropriate treatment pressure of 5 cwp. We should get in touch with the patient before her next visit which is scheduled for 09/07/2013 at 2:30 PM. Her air leak is low. Her pressure is low. We will need to find out from the patient or her home health company what the obstacles may be for full treatment adherence. I will also review this data with the patient at the next office visit, provide feedback and additional troubleshooting if need be.  Huston FoleySaima Parissa Chiao, MD, PhD [facility] ([facility]   ______

## 2013-05-06 ENCOUNTER — Encounter: Payer: Self-pay | Admitting: Neurology

## 2013-06-13 ENCOUNTER — Ambulatory Visit (INDEPENDENT_AMBULATORY_CARE_PROVIDER_SITE_OTHER): Payer: BC Managed Care – PPO | Admitting: Family Medicine

## 2013-06-13 VITALS — BP 120/88 | HR 86 | Temp 99.9°F | Resp 18 | Ht 69.0 in | Wt 289.4 lb

## 2013-06-13 DIAGNOSIS — J329 Chronic sinusitis, unspecified: Secondary | ICD-10-CM

## 2013-06-13 DIAGNOSIS — R062 Wheezing: Secondary | ICD-10-CM

## 2013-06-13 MED ORDER — ALBUTEROL SULFATE HFA 108 (90 BASE) MCG/ACT IN AERS: 2.0000 | INHALATION_SPRAY | Freq: Four times a day (QID) | RESPIRATORY_TRACT | Status: DC | PRN

## 2013-06-13 MED ORDER — HYDROCODONE-HOMATROPINE 5-1.5 MG/5ML PO SYRP: 5.0000 mL | ORAL_SOLUTION | Freq: Three times a day (TID) | ORAL | Status: DC | PRN

## 2013-06-13 MED ORDER — AMOXICILLIN 875 MG PO TABS: 875.0000 mg | ORAL_TABLET | Freq: Two times a day (BID) | ORAL | Status: DC

## 2013-06-13 NOTE — Patient Instructions (Signed)
I think that you have sinusitis.    Take the Amoxicillin twice daily for the next 10 days.    Albuterol inhaler at night to help with the cough.  Cough syrup to help as well.  This can make you sleepy.    Feel better.  If you're not better in the next week to 10 ten days come back and see Monique Shaw.

## 2013-06-13 NOTE — Progress Notes (Signed)
SUBJECTIVE: URI symptoms:  Monique Shaw is a 33 y.o. female who complains of URI symptoms present for past 9 days.  Describes rhinorrhea, sinus congestion, mild dry hacking cough worse at night, wakes her from sleep.  Had some sore throat and nasal congestion initially, cleared several days ago.  Now with Left sided headache and nasal congestion/sinus congestion worse on Left.    No history of asthma though bouts of bronchitis as a child.  Also with sore throat.  Has tried Zyrtec thinking this was allergies plus Ibuprofen for relief of headaches and body aches, both without relief.  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  No fevers or chills. No nausea or vomiting.  Denies smoking cigarettes.  OBJECTIVE: BP 120/88  Pulse 86  Temp(Src) 99.9 F (37.7 C) (Oral)  Resp 18  Ht 5\' 9"  (1.753 m)  Wt 289 lb 6.4 oz (131.271 kg)  BMI 42.72 kg/m2  SpO2 100%  LMP 06/03/2013 Gen:  Patient sitting on exam table, appears stated age in no acute distress Head: Normocephalic atraumatic Eyes: EOMI, PERRL, sclera and conjunctiva non-erythematous Ears:  Canals clear bilaterally.  TMs pearly gray bilaterally without erythema or bulging.   Nose:  Nasal turbinates grossly enlarged bilaterally. Some exudates noted. Tender to palpation of maxillary and ethmoid sinus Left>Right. Mouth: Mucosa membranes moist. Tonsils +2, nonenlarged, non-erythematous.  Throat and pharynx normal appearing. Neck: No cervical lymphadenopathy noted Heart:  RRR, no murmurs auscultated. Pulm:  Mild wheezing  Imp/Plan: 1.  Sinusitis: - Plan to treat due to length of symptoms and no improvement.   - Will prescribe amoxillcin x 10 days. - Instructed patient to return in 1 week for checkup or sooner if worsening or no improvement.  - Hycodan cough syrup prn - also with some wheezing on exam.  Trial of albuterol and recommend to FU with PCP to check for asthma.

## 2013-07-01 ENCOUNTER — Ambulatory Visit (INDEPENDENT_AMBULATORY_CARE_PROVIDER_SITE_OTHER): Payer: BC Managed Care – PPO | Admitting: Emergency Medicine

## 2013-07-01 VITALS — BP 132/84 | HR 67 | Temp 98.1°F | Resp 18 | Ht 68.5 in | Wt 287.2 lb

## 2013-07-01 DIAGNOSIS — J018 Other acute sinusitis: Secondary | ICD-10-CM

## 2013-07-01 MED ORDER — LEVOFLOXACIN 500 MG PO TABS: 500.0000 mg | ORAL_TABLET | Freq: Every day | ORAL | Status: AC

## 2013-07-01 NOTE — Progress Notes (Signed)
[facility] [address], [place] [id number] [phone]  Date:  07/01/2013   Name:  Monique Shaw   DOB:  23-Feb-1981   MRN:  [id number]  PCP:  Hoyle SauerAVVA,RAVISANKAR R, MD    Chief Complaint: Sore Throat, Facial Pain, Nasal Congestion and Cough   History of Present Illness:  Monique Shaw is a 33 y.o. very pleasant female patient who presents with the following:  Ill for over two weeks.  Seen two weeks ago with a sinus infection and treated with amoxicillin.  Failed to improve and FMD treated her with augmentin.  She improved but now has again nasal congestion and post nasal drainage.  Purulent in nature.  Sore throat.  Now has a cough productive of purulent sputum.  No further wheezing.  No nausea or vomiting.  Fatigue, malaise and muscle aches.  No improvement with over the counter medications or other home remedies. Denies other complaint or health concern today.   Patient Active Problem List   Diagnosis Date Noted  . Obstructive sleep apnea (adult) (pediatric) 08/13/2012  . Anxiety state, unspecified 07/14/2012  . Depression 07/14/2012  . Other and unspecified hyperlipidemia 07/14/2012  . Headache(784.0) 07/14/2012  . Allergic rhinitis 07/14/2012  . Unspecified vitamin D deficiency 07/14/2012  . Family history of colon cancer 07/14/2012    Past Medical History  Diagnosis Date  . Allergy   . Depression   . H/O toxoplasmosis   . Rubella   . H/O varicella   . Blood type, Rh negative   . Obese   . Irregular menstrual cycle 2004  . Weight gain 2004  . Hx: UTI (urinary tract infection) 2007  . ASCVD (arteriosclerotic cardiovascular disease) 2011  . Hypertension   . History of elevated lipids 2011  . H/O migraine 10/25/10  . Anxiety   . Sleep apnea   . Migraine   . Asthma     Past Surgical History  Procedure Laterality Date  . Wisdom tooth extraction      History  Substance Use Topics  . Smoking status: Never Smoker    . Smokeless tobacco: Never Used  . Alcohol Use: No    Family History  Problem Relation Age of Onset  . Heart disease Mother   . Hypertension Mother   . Cancer Mother   . Hypertension Father   . Anemia Father   . Cancer Maternal Uncle   . Cancer Paternal Aunt   . Alcohol abuse Paternal Uncle   . Hypertension Maternal Grandmother   . Heart disease Paternal Grandfather     MI    No Known Allergies  Medication list has been reviewed and updated.  Current Outpatient Prescriptions on File Prior to Visit  Medication Sig Dispense Refill  . albuterol (PROVENTIL HFA;VENTOLIN HFA) 108 (90 BASE) MCG/ACT inhaler Inhale 2 puffs into the lungs every 6 (six) hours as needed for wheezing or shortness of breath.  1 Inhaler  0  . ALPRAZolam (XANAX) 0.25 MG tablet Take 0.25 mg by mouth at bedtime as needed for sleep.      . Amphetamine-Dextroamphetamine (ADDERALL PO) Take by mouth.      . Armodafinil (NUVIGIL) 150 MG tablet Take 150 mg by mouth daily.      Marland Kitchen. amoxicillin (AMOXIL) 875 MG tablet Take 1 tablet (875 mg total) by mouth 2 (two) times daily.  20 tablet  0  . ergocalciferol (VITAMIN D2) 50000 UNITS capsule Take 50,000 Units by mouth once a week.      .Marland Kitchen  HYDROcodone-homatropine (HYCODAN) 5-1.5 MG/5ML syrup Take 5 mLs by mouth every 8 (eight) hours as needed for cough.  120 mL  0  . levonorgestrel-ethinyl estradiol (AVIANE,ALESSE,LESSINA) 0.1-20 MG-MCG tablet Take 1 tablet by mouth daily.      Marland Kitchen. orlistat (XENICAL) 120 MG capsule Take 120 mg by mouth 3 (three) times daily with meals.      . traMADol (ULTRAM) 50 MG tablet Take 1 tablet (50 mg total) by mouth every 8 (eight) hours as needed for pain.  30 tablet  0   Current Facility-Administered Medications on File Prior to Visit  Medication Dose Route Frequency Provider Last Rate Last Dose  . ondansetron (ZOFRAN-ODT) disintegrating tablet 4 mg  4 mg Oral Once Thao P Le, DO        Review of Systems:  As per HPI, otherwise negative.     Physical Examination: Filed Vitals:   07/01/13 2047  BP: 132/84  Pulse: 67  Temp: 98.1 F (36.7 C)  Resp: 18   Filed Vitals:   07/01/13 2047  Height: 5' 8.5" (1.74 m)  Weight: 287 lb 3.2 oz (130.273 kg)   Body mass index is 43.03 kg/(m^2). Ideal Body Weight: Weight in (lb) to have BMI = 25: 166.5  GEN: obese, NAD, Non-toxic, A & O x 3 HEENT: Atraumatic, Normocephalic. Neck supple. No masses, No LAD. Ears and Nose: No external deformity. CV: RRR, No M/G/R. No JVD. No thrill. No extra heart sounds. PULM: CTA B, no wheezes, crackles, rhonchi. No retractions. No resp. distress. No accessory muscle use. ABD: S, NT, ND, +BS. No rebound. No HSM. EXTR: No c/c/e NEURO Normal gait.  PSYCH: Normally interactive. Conversant. Not depressed or anxious appearing.  Calm demeanor.    Assessment and Plan: Sinusitis Bronchitis levaquin mucinex d  Signed,  Phillips OdorJeffery Celestine Bougie, MD

## 2013-07-01 NOTE — Patient Instructions (Signed)

## 2013-09-07 ENCOUNTER — Ambulatory Visit: Payer: BC Managed Care – PPO | Admitting: Neurology

## 2013-09-10 ENCOUNTER — Encounter (INDEPENDENT_AMBULATORY_CARE_PROVIDER_SITE_OTHER): Payer: Self-pay

## 2013-09-10 ENCOUNTER — Encounter: Payer: Self-pay | Admitting: Neurology

## 2013-09-10 ENCOUNTER — Ambulatory Visit (INDEPENDENT_AMBULATORY_CARE_PROVIDER_SITE_OTHER): Payer: BC Managed Care – PPO | Admitting: Neurology

## 2013-09-10 VITALS — BP 128/84 | HR 75 | Temp 97.8°F | Ht 68.5 in | Wt 282.0 lb

## 2013-09-10 DIAGNOSIS — G4733 Obstructive sleep apnea (adult) (pediatric): Secondary | ICD-10-CM

## 2013-09-10 DIAGNOSIS — F341 Dysthymic disorder: Secondary | ICD-10-CM

## 2013-09-10 DIAGNOSIS — F329 Major depressive disorder, single episode, unspecified: Secondary | ICD-10-CM

## 2013-09-10 DIAGNOSIS — G43009 Migraine without aura, not intractable, without status migrainosus: Secondary | ICD-10-CM

## 2013-09-10 DIAGNOSIS — F419 Anxiety disorder, unspecified: Secondary | ICD-10-CM

## 2013-09-10 DIAGNOSIS — Z9989 Dependence on other enabling machines and devices: Secondary | ICD-10-CM

## 2013-09-10 MED ORDER — PROMETHAZINE HCL 25 MG PO TABS: 25.0000 mg | ORAL_TABLET | Freq: Three times a day (TID) | ORAL | Status: DC | PRN

## 2013-09-10 MED ORDER — SUMATRIPTAN SUCCINATE 50 MG PO TABS: 50.0000 mg | ORAL_TABLET | Freq: Once | ORAL | Status: DC

## 2013-09-10 NOTE — Patient Instructions (Signed)
Please continue using your CPAP regularly. While your insurance requires that you use CPAP at least 4 hours each night on 70% of the nights, I recommend, that you not skip any nights and use it throughout the night if you can. Getting used to CPAP and staying with the treatment long term does take time and patience and discipline. Untreated obstructive sleep apnea when it is moderate to severe can have an adverse impact on cardiovascular health and raise her risk for heart disease, arrhythmias, hypertension, congestive heart failure, stroke and diabetes. Untreated obstructive sleep apnea causes sleep disruption, nonrestorative sleep, and sleep deprivation. This can have an impact on your day to day functioning and cause daytime sleepiness and impairment of cognitive function, memory loss, mood disturbance, and problems focussing. Using CPAP regularly can improve these symptoms.  Please remember, common headache triggers are: sleep deprivation, dehydration, overheating, stress, hypoglycemia or skipping meals and blood sugar fluctuations, excessive pain medications or excessive alcohol use or caffeine withdrawal. Some people have food triggers such as aged cheese, orange juice or chocolate, especially dark chocolate, or MSG (monosodium glutamate). Try to avoid these headache triggers as much possible. It may be helpful to keep a headache diary to figure out what makes your headaches worse or brings them on and what alleviates them. Some people report headache onset after exercise but studies have shown that regular exercise may actually prevent headaches from coming. If you have exercise-induced headaches, please make sure that you drink plenty of fluid before and after exercising and that you do not over do it and do not overheat.  Please remember to try to maintain good sleep hygiene, which means: Keep a regular sleep and wake schedule, try not to exercise or have a meal within 2 hours of your bedtime, try to  keep your bedroom conducive for sleep, that is, cool and dark, without light distractors such as an illuminated alarm clock, and refrain from watching TV right before sleep or in the middle of the night and do not keep the TV or radio on during the night. Also, try not to use or play on electronic devices at bedtime, such as your cell phone, tablet PC or laptop. If you like to read at bedtime on an electronic device, try to dim the background light as much as possible. Do not eat in the middle of the night.

## 2013-09-10 NOTE — Progress Notes (Signed)
Subjective:    Patient ID: Monique Shaw is a 33 y.o. female.  HPI    Interim history:   [name] is a very pleasant 33 year old right-handed woman with an underlying medical history of anxiety, depression, obesity, allergic rhinitis, vitamin D deficiency, who presents for followup consultation of her recurrent migraine headaches and her obstructive sleep apnea. She is unaccompanied today. I last saw her on 03/09/2013, at which time she indicated good results with CPAP and good tolerance of the pressure and mask but requested to try a nasal pillows mask as opposed to a nasal mask. We talked about her sleep test results as well as her compliance data at the time. We talked about weight loss as well. She felt that her headaches were improved after CPAP treatment for OSA. In the interim, I reviewed the patient's CPAP compliance data from 03/21/2013 to 04/19/2013, which is a total of 30 days, during which time the patient used CPAP only on 16 days. The average usage for all days was therefore low at 1 hour and 8 minutes. The percent used days greater than 4 hours was 7 %, indicating poor compliance. The residual AHI was 1.1 per hour, indicating an appropriate treatment pressure of 5 cwp.   Today, I reviewed her compliance data from 08/10/2013 through 09/08/2013 which is a total of 30 days during which time she is CPAP only 19 days with percent used days greater than 4 hours of only 13%, average usage of only 2 hours, AHI low at 1.3 per hour, leak low at 12.1 at the 95th percentile.  Today, she reports a different work schedule during the summer and she has been going to bed too late and does not keep a good. Her HAs are worse, and while she does not wake up with a headache, she has a headache in the afternoons. She sees Dr. Toy Care. She has been using Adderall 30 mg and takes 1 in AM and 1/2 in the afternoon. She is on Wellbutrin 150 mg qAM. She is not taking Xanax every day. She  has not been sleeping for more than 4-5 hours per night. She has work-related anxiety. She had counseling for depression, but not for anxiety. She tries to walk and do yoga and pilates.   I saw her on 08/13/2012, at which time I discussed her sleep study results with her from 07/24/2012. She was advised to consider using CPAP for treatment of her OSA. She had a CPAP titration study on 08/28/2012: Her sleep efficiency was normal at nearly 94%. She had no significant periodic leg movements of sleep. She had an increased percentage of REM sleep. She had a reduced REM latency. She had elimination of her sleep disordered breathing on a pressure of 5 cm and was started on CPAP based on the test results.  I reviewed her compliance data from 10/17/2012 through 11/15/2012 which is a total of 30 days during which time she used CPAP 21 days. Her percent used days greater than 4 hours was only 53%. She had a residual AHI of 1.7 per hour. Her average usage was 3 hours and 35 minutes only.  She presented to the emergency room on 02/22/2013 due to the upper respiratory symptoms. She had a chest x-ray which was negative. She was advised to treat symptomatically with oral rehydration and oral NSAIDs.  I had first met her on 07/24/2012 at the request of Dr. Dagmar Hait for complaint of daytime somnolence and nonrestorative sleep. She has  an underlying history of allergies, depression, obesity, hypertension, migraine headaches and anxiety. I suggested a sleep study which she had on 07/24/2012. Sleep efficiency was reduced at 78% with a normal sleep latency of 13.5 minutes. She had an increased amount of wake after sleep onset at 78.5 minutes with moderate sleep fragmentation noted. She had an increased percentage of stage II sleep, near absence of slow wave sleep and absence of REM sleep. She had mild to moderate snoring. Her AHI was 6.6 per hour, rising to 10.8 per hour in the supine position. Her baseline oxygen saturation was 94%,  her nadir was 90%. I explained to her that the absence of REM sleep may underestimate her underlying obstructive sleep apnea. Her REM sleep may have been suppressed due to medication effect, particularly Cymbalta and Xanax. Benzodiazepines tend to increase stage II sleep.  Her brain MRI without contrast from 08/07/2012 was reported as normal.   Her Past Medical History Is Significant For: Past Medical History  Diagnosis Date  . Allergy   . Depression   . H/O toxoplasmosis   . Rubella   . H/O varicella   . Blood type, Rh negative   . Obese   . Irregular menstrual cycle 2004  . Weight gain 2004  . Hx: UTI (urinary tract infection) 2007  . ASCVD (arteriosclerotic cardiovascular disease) 2011  . Hypertension   . History of elevated lipids 2011  . H/O migraine 10/25/10  . Anxiety   . Sleep apnea   . Migraine   . Asthma     Her Past Surgical History Is Significant For: Past Surgical History  Procedure Laterality Date  . Wisdom tooth extraction      Her Family History Is Significant For: Family History  Problem Relation Age of Onset  . Heart disease Mother   . Hypertension Mother   . Cancer Mother   . Hypertension Father   . Anemia Father   . Cancer Maternal Uncle   . Cancer Paternal Aunt   . Alcohol abuse Paternal Uncle   . Hypertension Maternal Grandmother   . Heart disease Paternal Grandfather     MI    Her Social History Is Significant For: History   Social History  . Marital Status: Married    Spouse Name: Herbie [place]    Number of Children: 1  . Years of Education: M. ED   Occupational History  .      Teacher   Social History Main Topics  . Smoking status: Never Smoker   . Smokeless tobacco: Never Used  . Alcohol Use: No  . Drug Use: No  . Sexual Activity: Yes    Birth Control/ Protection: Pill     Comment: ALESSE   Other Topics Concern  . None   Social History Narrative   Pt lives at home with husband Herbie [place]) and son   Pt is right handed     Education- Masters   Caffeine- occasional soda or tea but not every day   Her Allergies Are:  No Known Allergies:   Her Current Medications Are:  Outpatient Encounter Prescriptions as of 09/10/2013  Medication Sig  . ALPRAZolam (XANAX) 0.25 MG tablet Take 0.25 mg by mouth at bedtime as needed for sleep.  . Amphetamine-Dextroamphetamine (ADDERALL PO) Take by mouth.  . Armodafinil (NUVIGIL) 150 MG tablet Take 150 mg by mouth daily.  . BuPROPion HCl (WELLBUTRIN PO) Take 50 mg by mouth.  . ergocalciferol (VITAMIN D2) 50000 UNITS capsule Take 50,000 Units by mouth  once a week.  Marland Kitchen levonorgestrel-ethinyl estradiol (AVIANE,ALESSE,LESSINA) 0.1-20 MG-MCG tablet Take 1 tablet by mouth daily.  Marland Kitchen orlistat (XENICAL) 120 MG capsule Take 120 mg by mouth 3 (three) times daily with meals.  . [DISCONTINUED] albuterol (PROVENTIL HFA;VENTOLIN HFA) 108 (90 BASE) MCG/ACT inhaler Inhale 2 puffs into the lungs every 6 (six) hours as needed for wheezing or shortness of breath.  . [DISCONTINUED] amoxicillin (AMOXIL) 875 MG tablet Take 1 tablet (875 mg total) by mouth 2 (two) times daily.  . [DISCONTINUED] HYDROcodone-homatropine (HYCODAN) 5-1.5 MG/5ML syrup Take 5 mLs by mouth every 8 (eight) hours as needed for cough.  . [DISCONTINUED] traMADol (ULTRAM) 50 MG tablet Take 1 tablet (50 mg total) by mouth every 8 (eight) hours as needed for pain.  :  Review of Systems:  Out of a complete 14 point review of systems, all are reviewed and negative with the exception of these symptoms as listed below:   Review of Systems  Constitutional: Negative.   Eyes: Positive for photophobia.  Respiratory: Negative.   Cardiovascular: Negative.   Gastrointestinal: Positive for nausea.  Endocrine: Negative.   Genitourinary: Negative.   Musculoskeletal: Negative.   Skin: Negative.   Allergic/Immunologic: Negative.   Neurological: Positive for headaches.  Hematological: Bruises/bleeds easily.  Psychiatric/Behavioral: Positive  for sleep disturbance (insomnia, apnea, e.d.s.) and agitation. The patient is nervous/anxious.     Objective:  Neurologic Exam  Physical Exam Physical Examination:   Filed Vitals:   09/10/13 0923  BP: 128/84  Pulse: 75  Temp: 97.8 F (36.6 C)    General Examination: The patient is a very pleasant 33 y.o. female in no acute distress. She appears well-developed and well-nourished and well groomed. She is obese.   HEENT: Normocephalic, atraumatic, pupils are equal, round and reactive to light and accommodation. Funduscopic exam is normal with sharp disc margins noted. Extraocular tracking is good without limitation to gaze excursion or nystagmus noted. Normal smooth pursuit is noted. Hearing is grossly intact. Face is symmetric with normal facial animation and normal facial sensation. Speech is clear with no dysarthria noted. There is no hypophonia. There is no lip, neck/head, jaw or voice tremor. Neck is supple with full range of passive and active motion. There are no carotid bruits on auscultation. Oropharynx exam reveals: mild mouth dryness, mild pharyngeal erythema, good dental hygiene and mild airway crowding, due to larger tongue and wider based uvula. Mallampati is class I. Tongue protrudes centrally and palate elevates symmetrically. Nasal inspection reveals mucosal erythema and bogginess.   Chest: Clear to auscultation without wheezing, rhonchi or crackles noted.  Heart: S1+S2+0, regular and normal without murmurs, rubs or gallops noted.   Abdomen: Soft, non-tender and non-distended with normal bowel sounds appreciated on auscultation.  Extremities: There is no pitting edema in the distal lower extremities bilaterally.   Skin: Warm and dry without trophic changes noted. There are no varicose veins, except one in the medial R calf.  Musculoskeletal: exam reveals no obvious joint deformities, tenderness or joint swelling or erythema.   Neurologically:  Mental status: The  patient is awake, alert and oriented in all 4 spheres. Her memory, attention, language and knowledge are appropriate. There is no aphasia, agnosia, apraxia or anomia. Speech is clear with normal prosody and enunciation. Thought process is linear. Mood is congruent and affect is normal.  Cranial nerves are as described above under HEENT exam. In addition, shoulder shrug is normal with equal shoulder height noted. Motor exam: Normal bulk, strength and tone is noted.  There is no drift, tremor or rebound. Romberg is negative. Reflexes are 2+ throughout. Fine motor skills are intact with normal finger taps, normal hand movements, normal rapid alternating patting, normal foot taps and normal foot agility.  Cerebellar testing shows no dysmetria or intention tremor on finger to nose testing. There is no truncal or gait ataxia.  Sensory exam is intact to light touch, pinprick, vibration, temperature sense in the upper and lower extremities.  Gait, station and balance are unremarkable. No veering to one side is noted. No leaning to one side is noted. Posture is age-appropriate and stance is narrow based. No problems turning are noted. She turns en bloc. Tandem walk is unremarkable. Intact toe and heel stance is noted.               Assessment and Plan:   In summary, [place] Shaw is a very pleasant 33 year old female with a history of recurrent headaches, improved after treatment of her mild OSA with CPAP of 5 cwp. Her physical exam is stable and while she indicated good results with the use of CPAP, and good tolerance of the pressure and mask, she has not been fully compliant with CPAP treatment in the recent past. She is reporting recurrence of HAs and has had more anxiety lately. She is advised about her most recent compliance data and in comparison to January and February she has indeed picked up the usage of CPAP. She has no particular reason not to using CPAP other than taking it off in the middle of  the night and not putting it back on and she has some anxiety surrounding her sleep. She has not addressed her recent flare up and anxiety with her psychiatrist. She is encouraged to do so. In the past she had gone through some counseling for her depression and is encouraged to bring this up with her psychiatrist as an option to deal with her anxiety. She has tried a nasal pillows mask but does prefer the nasal mask. She is up for a new mask I think it next month. She has been using Imitrex and Phenergan sparingly. She needed a refill so I prescribed her Imitrex and Phenergan again. She is advised to keep a better schedule and is not sleep time. She is advised about headache triggers, better sleep hygiene and the importance of CPAP compliance which I believe will help her mood disorder, her anxiety, and her headaches in the long run. She is furthermore advised regarding her sleep apnea. It is certainly not severe but it had helped to put her on treatment and she endorses that she felt better when she was better compliant with CPAP. She is willing to get back on track with diet. She's working on weight loss and is commended for that. She has lost about 7 pounds in the last 6 months from what I can see.  I answered all her  questions today and the patient was in agreement with the above outlined plan. I would like to see the patient back in 6 months, sooner if the need arises and encouraged her  to call with any interim questions, concerns, problems or updates.

## 2014-01-18 ENCOUNTER — Encounter: Payer: Self-pay | Admitting: Neurology

## 2014-01-26 ENCOUNTER — Telehealth: Payer: Self-pay | Admitting: *Deleted

## 2014-01-26 NOTE — Telephone Encounter (Signed)
Call pt to resched apt. Lft VM with new apt date and time. Requested she call in if the apt does not fit her schedule.  

## 2014-03-05 ENCOUNTER — Encounter: Payer: Self-pay | Admitting: Neurology

## 2014-03-09 ENCOUNTER — Ambulatory Visit (INDEPENDENT_AMBULATORY_CARE_PROVIDER_SITE_OTHER): Payer: BC Managed Care – PPO | Admitting: Neurology

## 2014-03-09 ENCOUNTER — Encounter: Payer: Self-pay | Admitting: Neurology

## 2014-03-09 VITALS — BP 117/80 | HR 84 | Temp 98.4°F | Ht 69.0 in | Wt 280.0 lb

## 2014-03-09 DIAGNOSIS — Z9989 Dependence on other enabling machines and devices: Secondary | ICD-10-CM

## 2014-03-09 DIAGNOSIS — G43009 Migraine without aura, not intractable, without status migrainosus: Secondary | ICD-10-CM

## 2014-03-09 DIAGNOSIS — G4733 Obstructive sleep apnea (adult) (pediatric): Secondary | ICD-10-CM

## 2014-03-09 MED ORDER — SUMATRIPTAN SUCCINATE 50 MG PO TABS: 50.0000 mg | ORAL_TABLET | Freq: Once | ORAL | Status: DC

## 2014-03-09 MED ORDER — ONDANSETRON 4 MG PO TBDP: 4.0000 mg | ORAL_TABLET | Freq: Three times a day (TID) | ORAL | Status: DC | PRN

## 2014-03-09 NOTE — Progress Notes (Signed)
Subjective:    Patient ID: Monique Shaw is a 33 y.o. female.  HPI     Interim history:   [name] is a very pleasant 33 year old right-handed woman with an underlying medical history of anxiety, depression, obesity, allergic rhinitis, and vitamin D deficiency, who presents for followup consultation of her recurrent migraine headaches and her obstructive sleep apnea. She is unaccompanied today. I last saw her on 09/10/2013, at which time she reported worsening headaches because of her work-related schedule most likely. She reported sleeping only 4-5 hours per night. I encouraged her to be fully compliant with CPAP therapy. I prescribed Imitrex as needed and Phenergan as needed for migraines.  Today, I reviewed her compliance data from 02/01/2014 through 03/02/2014 which is a total of 30 days during which time she used her CPAP only 21 days. Percent used days greater than 4 hours was only 23%, indicating poor compliance. Average usage for all days of only 2 hours and 12 minutes. Average usage for days on device was 3 hours and 9 minutes. AHI 0.6 per hour, leaked low at 7.1 L per minute for the 95th percentile and pressure at 5 cm.  Today, she reports, she has been using her CPAP more since Thanksgiving. She went to UC with [facility]. She going to start a new job at [employer]. 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 She hopes for less stress and less commute. Sometimes she takes Excedrin or aleve. She sleeps better with CPAP. She lives with her husband and her 85 yo. She had laser surgery for varicose veins in September 2015.   I saw her on 03/09/2013, at which time she indicated good results with CPAP and good tolerance of the pressure and mask but requested to try a nasal pillows mask as opposed to a nasal mask. We talked about her sleep test results as well as her compliance data at the time. We talked about weight loss as well. She felt that her headaches  were improved after CPAP treatment for OSA. In the interim, I reviewed the patient's CPAP compliance data from 03/21/2013 to 04/19/2013, which is a total of 30 days, during which time the patient used CPAP only on 16 days. The average usage for all days was therefore low at 1 hour and 8 minutes. The percent used days greater than 4 hours was 7 %, indicating poor compliance. The residual AHI was 1.1 per hour, indicating an appropriate treatment pressure of 5 cwp.   I reviewed her compliance data from 08/10/2013 through 09/08/2013 which is a total of 30 days during which time she is CPAP only 19 days with percent used days greater than 4 hours of only 13%, average usage of only 2 hours, AHI low at 1.3 per hour, leak low at 12.1 at the 95th percentile.  I saw her on 08/13/2012, at which time I discussed her sleep study results with her from 07/24/2012. She was advised to consider using CPAP for treatment of her OSA. She had a CPAP titration study on 08/28/2012: Her sleep efficiency was normal at nearly 94%. She had no significant periodic leg movements of sleep. She had an increased percentage of REM sleep. She had a reduced REM latency. She had elimination of her sleep disordered breathing on a pressure of 5 cm and was started on CPAP based on the test results.   I reviewed her compliance data from 10/17/2012 through 11/15/2012 which is a total of 30 days during  which time she used CPAP 21 days. Her percent used days greater than 4 hours was only 53%. She had a residual AHI of 1.7 per hour. Her average usage was 3 hours and 35 minutes only.   She presented to the emergency room on 02/22/2013 due to the upper respiratory symptoms. She had a chest x-ray which was negative. She was advised to treat symptomatically with oral rehydration and oral NSAIDs.   I had first met her on 07/24/2012 at the request of Dr. Felipa Eth for complaint of daytime somnolence and nonrestorative sleep. She has an underlying history of  allergies, depression, obesity, hypertension, migraine headaches and anxiety. I suggested a sleep study which she had on 07/24/2012. Sleep efficiency was reduced at 78% with a normal sleep latency of 13.5 minutes. She had an increased amount of wake after sleep onset at 78.5 minutes with moderate sleep fragmentation noted. She had an increased percentage of stage II sleep, near absence of slow wave sleep and absence of REM sleep. She had mild to moderate snoring. Her AHI was 6.6 per hour, rising to 10.8 per hour in the supine position. Her baseline oxygen saturation was 94%, her nadir was 90%. I explained to her that the absence of REM sleep may underestimate her underlying obstructive sleep apnea. Her REM sleep may have been suppressed due to medication effect, particularly Cymbalta and Xanax. Benzodiazepines tend to increase stage II sleep.   Her brain MRI without contrast from 08/07/2012 was reported as normal.   Her Past Medical History Is Significant For: Past Medical History  Diagnosis Date  . Allergy   . Depression   . H/O toxoplasmosis   . Rubella   . H/O varicella   . Blood type, Rh negative   . Obese   . Irregular menstrual cycle 2004  . Weight gain 2004  . Hx: UTI (urinary tract infection) 2007  . ASCVD (arteriosclerotic cardiovascular disease) 2011  . Hypertension   . History of elevated lipids 2011  . H/O migraine 10/25/10  . Anxiety   . Sleep apnea   . Migraine   . Asthma     Her Past Surgical History Is Significant For: Past Surgical History  Procedure Laterality Date  . Wisdom tooth extraction      Her Family History Is Significant For: Family History  Problem Relation Age of Onset  . Heart disease Mother   . Hypertension Mother   . Cancer Mother   . Hypertension Father   . Anemia Father   . Cancer Maternal Uncle   . Cancer Paternal Aunt   . Alcohol abuse Paternal Uncle   . Hypertension Maternal Grandmother   . Heart disease Paternal Grandfather     MI     Her Social History Is Significant For: History   Social History  . Marital Status: Married    Spouse Name: Molly Maduro    Number of Children: 1  . Years of Education: M. ED   Occupational History  .      Teacher   Social History Main Topics  . Smoking status: Never Smoker   . Smokeless tobacco: Never Used  . Alcohol Use: No  . Drug Use: No  . Sexual Activity: Yes    Birth Control/ Protection: Pill     Comment: ALESSE   Other Topics Concern  . None   Social History Narrative   Pt lives at home with husband Molly Maduro) and son   Pt is right handed    Education- Masters  Caffeine- occasional soda or tea but not every day    Her Allergies Are:  No Known Allergies:   Her Current Medications Are:  Outpatient Encounter Prescriptions as of 03/09/2014  Medication Sig  . ALPRAZolam (XANAX) 0.25 MG tablet Take 0.25 mg by mouth at bedtime as needed for sleep.  . Amphetamine-Dextroamphetamine (ADDERALL PO) Take by mouth.  . Armodafinil (NUVIGIL) 150 MG tablet Take 150 mg by mouth daily.  Marland Kitchen buPROPion (WELLBUTRIN XL) 150 MG 24 hr tablet Take 150 mg by mouth.  . BuPROPion HCl (WELLBUTRIN PO) Take 50 mg by mouth.  . ergocalciferol (VITAMIN D2) 50000 UNITS capsule Take 50,000 Units by mouth once a week.  Marland Kitchen levonorgestrel-ethinyl estradiol (AVIANE,ALESSE,LESSINA) 0.1-20 MG-MCG tablet Take 1 tablet by mouth daily.  . ondansetron (ZOFRAN-ODT) 4 MG disintegrating tablet   . orlistat (XENICAL) 120 MG capsule Take 120 mg by mouth 3 (three) times daily with meals.  . promethazine (PHENERGAN) 25 MG tablet Take 1 tablet (25 mg total) by mouth every 8 (eight) hours as needed for nausea.  . SUMAtriptan (IMITREX) 50 MG tablet Take 1 tablet (50 mg total) by mouth once. May repeat in 2 hours if needed. No more than 2 per 24 hours. No more than 3 times per week.  :  Review of Systems:  Out of a complete 14 point review of systems, all are reviewed and negative with the exception of these symptoms  as listed below:   Review of Systems  Neurological:       Worsened migraines    Objective:  Neurologic Exam  Physical Exam Physical Examination:   Filed Vitals:   03/09/14 1120  BP: 117/80  Pulse: 84  Temp: 98.4 F (36.9 C)   General Examination: The patient is a very pleasant 33 y.o. female in no acute distress. She appears well-developed and well-nourished and well groomed. She is obese. She is in good spirits today.  HEENT: Normocephalic, atraumatic, pupils are equal, round and reactive to light and accommodation. Funduscopic exam is normal with sharp disc margins noted. Extraocular tracking is good without limitation to gaze excursion or nystagmus noted. Normal smooth pursuit is noted. Hearing is grossly intact. Face is symmetric with normal facial animation and normal facial sensation. Speech is clear with no dysarthria noted. There is no hypophonia. There is no lip, neck/head, jaw or voice tremor. Neck is supple with full range of passive and active motion. There are no carotid bruits on auscultation. Oropharynx exam reveals: mild mouth dryness, mild pharyngeal erythema, good dental hygiene and mild airway crowding, due to larger tongue and wider based uvula. Mallampati is class I. Tongue protrudes centrally and palate elevates symmetrically. Nasal inspection reveals mucosal erythema and bogginess.   Chest: Clear to auscultation without wheezing, rhonchi or crackles noted.  Heart: S1+S2+0, regular and normal without murmurs, rubs or gallops noted.   Abdomen: Soft, non-tender and non-distended with normal bowel sounds appreciated on auscultation.  Extremities: There is no pitting edema in the distal lower extremities bilaterally.   Skin: Warm and dry without trophic changes noted. There are no varicose veins, she had vein laser surgery in 9/15.   Musculoskeletal: exam reveals no obvious joint deformities, tenderness or joint swelling or erythema.   Neurologically:  Mental  status: The patient is awake, alert and oriented in all 4 spheres. Her memory, attention, language and knowledge are appropriate. There is no aphasia, agnosia, apraxia or anomia. Speech is clear with normal prosody and enunciation. Thought process is linear. Mood is  congruent and affect is normal.  Cranial nerves are as described above under HEENT exam. In addition, shoulder shrug is normal with equal shoulder height noted. Motor exam: Normal bulk, strength and tone is noted. There is no drift, tremor or rebound. Romberg is negative. Reflexes are 2+ throughout. Fine motor skills are intact with normal finger taps, normal hand movements, normal rapid alternating patting, normal foot taps and normal foot agility.  Cerebellar testing shows no dysmetria or intention tremor on finger to nose testing. There is no truncal or gait ataxia.  Sensory exam is intact to light touch, pinprick, vibration, temperature sense in the upper and lower extremities.  Gait, station and balance are unremarkable. No veering to one side is noted. No leaning to one side is noted. Posture is age-appropriate and stance is narrow based. No problems turning are noted. She turns en bloc. Tandem walk is unremarkable.              Assessment and Plan:   In summary, Brynley Massenburg Oval Shaw is a very pleasant 33 year old female with a history of recurrent headaches, improved after treatment of her mild OSA with CPAP of 5 cwp. Of note, her baseline sleep study in May 2014 did not show any REM sleep, leaving her OSA likely underestimated. Her physical exam is stable and nonfocal and while she indicated good results with the use of CPAP, and good tolerance of the pressure and mask, she has not been fully compliant with CPAP treatment in the recent past. However, after Thanksgiving she has been consistent with CPAP usage. I think her main migraine triggers her sleep deprivation, dehydration and stress. Hopefully her stress will improve once she  is starting her new job closer to home. She has had reasonable success with Imitrex as needed as well as Phenergan or Zofran. Zofran is less sedating. I have renewed her prescription for Zofran 4 mg as needed and Imitrex 50 mg as needed.  We talked about her baseline sleep study results from May 2014 as well as her CPAP titration results from June 2014 again today. We talked about her compliance data. Pressure seems to be adequate. She has not had any recent supplies, probably since January of this year and needs new supplies.  She has tried a nasal pillows mask but does prefer the nasal mask.  She is advised about headache triggers, better sleep hygiene and the importance of CPAP compliance. She is trying to lose weight and has lost about 17 pounds since she started to work on her weight loss and is congratulated on her successes. I would like to see her back in 6 months, sooner if need be. I answered all her questions today and she was in agreement.

## 2014-03-09 NOTE — Patient Instructions (Signed)
Please continue using your CPAP every night.  Be well hydrated.  Reduction in stress will help your headaches.   Please remember, common headache triggers are: sleep deprivation, dehydration, overheating, stress, hypoglycemia or skipping meals and blood sugar fluctuations, excessive pain medications or excessive alcohol use or caffeine withdrawal. Some people have food triggers such as aged cheese, orange juice or chocolate, especially dark chocolate, or MSG (monosodium glutamate). Try to avoid these headache triggers as much possible. It may be helpful to keep a headache diary to figure out what makes your headaches worse or brings them on and what alleviates them. Some people report headache onset after exercise but studies have shown that regular exercise may actually prevent headaches from coming. If you have exercise-induced headaches, please make sure that you drink plenty of fluid before and after exercising and that you do not over do it and do not overheat.

## 2014-03-10 ENCOUNTER — Telehealth: Payer: Self-pay | Admitting: Neurology

## 2014-03-10 MED ORDER — SUMATRIPTAN 20 MG/ACT NA SOLN: 20.0000 mg | NASAL | Status: DC | PRN

## 2014-03-10 MED ORDER — CYCLOBENZAPRINE HCL 10 MG PO TABS: 10.0000 mg | ORAL_TABLET | Freq: Three times a day (TID) | ORAL | Status: DC | PRN

## 2014-03-10 NOTE — Telephone Encounter (Signed)
Patient paged the on call doctor. She has had a migraine since 8am this morning. Tried imitrex at 8am and then at 10am. She took zofran at 10am and nausea is better. She had last migraine at Thanksgiving and the oral triptan didn't work at that time either. So this is second time imitrex has not worked.   Discussed options and patient would like to try imitrex nasal spray in liu of the oral. Stop taking oral imitrex. Will call in imitrex nasal. Will also call in flexeril which may help in addition to the zofran.

## 2014-06-10 ENCOUNTER — Telehealth: Payer: Self-pay | Admitting: *Deleted

## 2014-06-10 DIAGNOSIS — Z0289 Encounter for other administrative examinations: Secondary | ICD-10-CM

## 2014-06-10 NOTE — Telephone Encounter (Signed)
Patient need to sign a release form.

## 2014-06-11 DIAGNOSIS — G43009 Migraine without aura, not intractable, without status migrainosus: Secondary | ICD-10-CM | POA: Diagnosis present

## 2014-08-20 ENCOUNTER — Emergency Department (HOSPITAL_BASED_OUTPATIENT_CLINIC_OR_DEPARTMENT_OTHER): Payer: BC Managed Care – PPO

## 2014-08-20 ENCOUNTER — Emergency Department (HOSPITAL_BASED_OUTPATIENT_CLINIC_OR_DEPARTMENT_OTHER)
Admission: EM | Admit: 2014-08-20 | Discharge: 2014-08-20 | Disposition: A | Discharge status: Home / Self Care | Payer: BC Managed Care – PPO | Attending: Emergency Medicine | Admitting: Emergency Medicine

## 2014-08-20 ENCOUNTER — Other Ambulatory Visit: Payer: Self-pay

## 2014-08-20 ENCOUNTER — Encounter (HOSPITAL_BASED_OUTPATIENT_CLINIC_OR_DEPARTMENT_OTHER): Payer: Self-pay | Admitting: *Deleted

## 2014-08-20 DIAGNOSIS — Z79899 Other long term (current) drug therapy: Secondary | ICD-10-CM | POA: Diagnosis not present

## 2014-08-20 DIAGNOSIS — Z8742 Personal history of other diseases of the female genital tract: Secondary | ICD-10-CM | POA: Insufficient documentation

## 2014-08-20 DIAGNOSIS — Z8619 Personal history of other infectious and parasitic diseases: Secondary | ICD-10-CM | POA: Diagnosis not present

## 2014-08-20 DIAGNOSIS — R079 Chest pain, unspecified: Secondary | ICD-10-CM | POA: Diagnosis not present

## 2014-08-20 DIAGNOSIS — J45909 Unspecified asthma, uncomplicated: Secondary | ICD-10-CM | POA: Insufficient documentation

## 2014-08-20 DIAGNOSIS — I10 Essential (primary) hypertension: Secondary | ICD-10-CM | POA: Insufficient documentation

## 2014-08-20 DIAGNOSIS — I2510 Atherosclerotic heart disease of native coronary artery without angina pectoris: Secondary | ICD-10-CM | POA: Insufficient documentation

## 2014-08-20 DIAGNOSIS — F329 Major depressive disorder, single episode, unspecified: Secondary | ICD-10-CM | POA: Diagnosis not present

## 2014-08-20 DIAGNOSIS — F419 Anxiety disorder, unspecified: Secondary | ICD-10-CM | POA: Diagnosis not present

## 2014-08-20 DIAGNOSIS — Z87440 Personal history of urinary (tract) infections: Secondary | ICD-10-CM | POA: Diagnosis not present

## 2014-08-20 DIAGNOSIS — G43909 Migraine, unspecified, not intractable, without status migrainosus: Secondary | ICD-10-CM | POA: Diagnosis not present

## 2014-08-20 DIAGNOSIS — E669 Obesity, unspecified: Secondary | ICD-10-CM | POA: Diagnosis not present

## 2014-08-20 LAB — CBC WITH DIFFERENTIAL/PLATELET
BASOS PCT: 0 % (ref 0–1)
Basophils Absolute: 0 10*3/uL (ref 0.0–0.1)
Eosinophils Absolute: 0.1 10*3/uL (ref 0.0–0.7)
Eosinophils Relative: 1 % (ref 0–5)
HEMATOCRIT: 43.9 % (ref 36.0–46.0)
HEMOGLOBIN: 14.6 g/dL (ref 12.0–15.0)
LYMPHS ABS: 2.7 10*3/uL (ref 0.7–4.0)
LYMPHS PCT: 38 % (ref 12–46)
MCH: 30.2 pg (ref 26.0–34.0)
MCHC: 33.3 g/dL (ref 30.0–36.0)
MCV: 90.7 fL (ref 78.0–100.0)
MONOS PCT: 10 % (ref 3–12)
Monocytes Absolute: 0.7 10*3/uL (ref 0.1–1.0)
NEUTROS ABS: 3.5 10*3/uL (ref 1.7–7.7)
Neutrophils Relative %: 51 % (ref 43–77)
PLATELETS: 251 10*3/uL (ref 150–400)
RBC: 4.84 MIL/uL (ref 3.87–5.11)
RDW: 12.8 % (ref 11.5–15.5)
WBC: 6.9 10*3/uL (ref 4.0–10.5)

## 2014-08-20 LAB — COMPREHENSIVE METABOLIC PANEL
ALT: 17 U/L (ref 14–54)
AST: 21 U/L (ref 15–41)
Albumin: 3.7 g/dL (ref 3.5–5.0)
Alkaline Phosphatase: 75 U/L (ref 38–126)
Anion gap: 8 (ref 5–15)
BUN: 25 mg/dL — ABNORMAL HIGH (ref 6–20)
CALCIUM: 8.9 mg/dL (ref 8.9–10.3)
CO2: 22 mmol/L (ref 22–32)
Chloride: 107 mmol/L (ref 101–111)
Creatinine, Ser: 1.04 mg/dL — ABNORMAL HIGH (ref 0.44–1.00)
GFR calc Af Amer: >60 mL/min (ref >60)
GFR calc non Af Amer: >60 mL/min (ref >60)
Glucose, Bld: 90 mg/dL (ref 65–99)
Potassium: 3.6 mmol/L (ref 3.5–5.1)
Sodium: 137 mmol/L (ref 135–145)
TOTAL PROTEIN: 6.9 g/dL (ref 6.5–8.1)
Total Bilirubin: 0.2 mg/dL — ABNORMAL LOW (ref 0.3–1.2)

## 2014-08-20 LAB — TROPONIN I

## 2014-08-20 LAB — HCG, SERUM, QUALITATIVE: Preg, Serum: NEGATIVE

## 2014-08-20 NOTE — Discharge Instructions (Signed)

## 2014-08-20 NOTE — ED Notes (Signed)
MD at bedside. 

## 2014-08-20 NOTE — ED Provider Notes (Signed)
[name]: [id number]     Arrival date & time 08/20/14  1859 History  This chart was scribed for Geoffery Lyons, MD by Bronson Curb, ED Scribe. This patient was seen in room MH11/MH11 and the patient's care was started at 7:16 PM.   Chief Complaint  Patient presents with  . Chest Pain    The history is provided by the patient. No language interpreter was used.     HPI Comments: Monique Shaw is a 34 y.o. female who presents to the Emergency Department complaining of intermittent left sided chest pain with associated fatigue for the past "few days". Patient describes the pain as a "pulled muscle" and is unsure if it is cardiac related. She also mentions dull, lower back pain when sitting up, but is unsure if this is related. Patient was recently diagnosed with HTN 1 month ago and has been on a beta blocker for approximately 2 weeks. She denies any chest pain on exertion, after eating, or with deep breathing. Patient is a nonsmoker and denies history of DM, high cholesterol, or CAD. She reports maternal history of HTN and MI at the age of 64, in addition to a paternal history of high cholesterol. She denies cough, fever, SOB, diaphoresis, nausea, BLE pain or swelling. She further denies any chance of pregnancy. LNMP 1 week ago.  Past Medical History  Diagnosis Date  . Allergy   . Depression   . H/O toxoplasmosis   . Rubella   . H/O varicella   . Blood type, Rh negative   . Obese   . Irregular menstrual cycle 2004  . Weight gain 2004  . Hx: UTI (urinary tract infection) 2007  . ASCVD (arteriosclerotic cardiovascular disease) 2011  . Hypertension   . History of elevated lipids 2011  . H/O migraine 10/25/10  . Anxiety   . Sleep apnea   . Migraine   . Asthma    Past Surgical History  Procedure Laterality Date  . Wisdom tooth extraction     Family History  Problem Relation Age of Onset  . Heart disease Mother   . Hypertension Mother   . Cancer Mother   . Hypertension Father    . Anemia Father   . Cancer Maternal Uncle   . Cancer Paternal Aunt   . Alcohol abuse Paternal Uncle   . Hypertension Maternal Grandmother   . Heart disease Paternal Grandfather     MI   History  Substance Use Topics  . Smoking status: Never Smoker   . Smokeless tobacco: Never Used  . Alcohol Use: No   OB History    Gravida Para Term Preterm AB TAB SAB Ectopic Multiple Living   Review of Systems  A complete 10 system review of systems was obtained and all systems are negative except as noted in the HPI and PMH.   Allergies  Review of patient's allergies indicates no known allergies.  Home Medications   Prior to Admission medications   Medication Sig Start Date End Date Taking? Authorizing Provider  Topiramate (TOPAMAX PO) Take by mouth.   Yes Historical Provider, MD  ALPRAZolam Prudy Feeler) 0.25 MG tablet Take 0.25 mg by mouth at bedtime as needed for sleep.    Historical Provider, MD  Amphetamine-Dextroamphetamine (ADDERALL PO) Take by mouth.    Historical Provider, MD  Armodafinil (NUVIGIL) 150 MG tablet Take 150 mg by mouth daily.    Historical Provider,  MD  buPROPion (WELLBUTRIN XL) 150 MG 24 hr tablet Take 150 mg by mouth.    Historical Provider, MD  BuPROPion HCl (WELLBUTRIN PO) Take 50 mg by mouth.    Historical Provider, MD  cyclobenzaprine (FLEXERIL) 10 MG tablet Take 1 tablet (10 mg total) by mouth 3 (three) times daily as needed for muscle spasms. And migraine. 03/10/14   Anson FretAntonia B Ahern, MD  ergocalciferol (VITAMIN D2) 50000 UNITS capsule Take 50,000 Units by mouth once a week.    Historical Provider, MD  levonorgestrel-ethinyl estradiol (AVIANE,ALESSE,LESSINA) 0.1-20 MG-MCG tablet Take 1 tablet by mouth daily.    Historical Provider, MD  ondansetron (ZOFRAN-ODT) 4 MG disintegrating tablet Take 1 tablet (4 mg total) by mouth every 8 (eight) hours as needed for nausea or vomiting. 03/09/14   Huston FoleySaima Athar, MD  orlistat (XENICAL) 120 MG capsule Take 120  mg by mouth 3 (three) times daily with meals.    Historical Provider, MD  promethazine (PHENERGAN) 25 MG tablet Take 1 tablet (25 mg total) by mouth every 8 (eight) hours as needed for nausea. 09/10/13   Huston FoleySaima Athar, MD  SUMAtriptan (IMITREX) 20 MG/ACT nasal spray Place 1 spray (20 mg total) into the nose every 2 (two) hours as needed for migraine or headache. May repeat in 2 hours if headache persists or recurs. 03/10/14   Anson FretAntonia B Ahern, MD  SUMAtriptan (IMITREX) 50 MG tablet Take 1 tablet (50 mg total) by mouth once. May repeat in 2 hours if needed. No more than 2 per 24 hours. No more than 3 times per week. 03/09/14   Huston FoleySaima Athar, MD   Triage Vitals: BP 115/76 mmHg  Pulse 63  Temp(Src) 98.3 F (36.8 C) (Oral)  Resp 18  Ht 5\' 9"  (1.753 m)  Wt 280 lb (127.007 kg)  BMI 41.33 kg/m2  SpO2 100%  LMP 08/20/2014  Physical Exam  Constitutional: She is oriented to person, place, and time. She appears well-developed and well-nourished. No distress.  HENT:  Head: Normocephalic and atraumatic.  Eyes: Conjunctivae and EOM are normal.  Neck: Neck supple. No tracheal deviation present.  Cardiovascular: Normal rate, regular rhythm and normal heart sounds.   Pulmonary/Chest: Effort normal and breath sounds normal. No respiratory distress.  Abdominal: Soft. Bowel sounds are normal. She exhibits no distension. There is no tenderness. There is no rebound and no guarding.  Musculoskeletal: Normal range of motion. She exhibits no edema or tenderness.  Neurological: She is alert and oriented to person, place, and time.  Skin: Skin is warm and dry.  Psychiatric: She has a normal mood and affect. Her behavior is normal.  Nursing note and vitals reviewed.   ED Course  Procedures (including critical care time)  DIAGNOSTIC STUDIES: Oxygen Saturation is 100% on room air, normal by my interpretation.    COORDINATION OF CARE: At 1922 Discussed treatment plan with patient which includes labs and CXR.  Patient agrees.   Labs Review Labs Reviewed  COMPREHENSIVE METABOLIC PANEL - Abnormal; Notable for the following:    BUN 25 (*)    Creatinine, Ser 1.04 (*)    Total Bilirubin 0.2 (*)    All other components within normal limits  CBC WITH DIFFERENTIAL/PLATELET  HCG, SERUM, QUALITATIVE  TROPONIN I    Imaging Review Dg Chest 2 View  08/20/2014   CLINICAL DATA:  Left-sided chest pain for 2-3 days.  EXAM: CHEST - 2 VIEW  COMPARISON:  None.  FINDINGS: The heart size and mediastinal contours are within normal limits.  Both lungs are clear. The visualized skeletal structures are unremarkable.  IMPRESSION: Negative two view chest x-ray.   Electronically Signed   By: Marin Roberts M.D.   On: 08/20/2014 19:47    ED ECG REPORT   Date: 08/20/2014  Rate: 64  Rhythm: normal sinus rhythm  QRS Axis: normal  Intervals: normal  ST/T Wave abnormalities: normal  Conduction Disutrbances:none  Narrative Interpretation:   Old EKG Reviewed: none available  I have personally reviewed the EKG tracing and agree with the computerized printout as noted.   MDM   Final diagnoses:  None    Patient presents with complaints of chest discomfort. Her symptoms are somewhat atypical for cardiac pain in her workup reveals a normal EKG and laboratory studies. She has no risk factors and I believe is appropriate for discharge. She is to follow-up with her primary Dr. if not improving.  I personally performed the services described in this documentation, which was scribed in my presence. The recorded information has been reviewed and is accurate.      Geoffery Lyons, MD 08/20/14 2129

## 2014-08-20 NOTE — ED Notes (Signed)
Patient transported to X-ray 

## 2014-08-20 NOTE — ED Notes (Signed)
Chest pain. States she feels like she has a pulled muscle. Recent diagnosis of HTN.

## 2014-09-08 ENCOUNTER — Telehealth: Payer: Self-pay

## 2014-09-08 ENCOUNTER — Ambulatory Visit: Payer: Self-pay | Admitting: Neurology

## 2014-09-08 NOTE — Telephone Encounter (Signed)
Patient did not come to appt today.  

## 2014-09-17 ENCOUNTER — Encounter: Payer: Self-pay | Admitting: Neurology

## 2014-09-28 DIAGNOSIS — Z0289 Encounter for other administrative examinations: Secondary | ICD-10-CM

## 2014-10-15 ENCOUNTER — Ambulatory Visit (INDEPENDENT_AMBULATORY_CARE_PROVIDER_SITE_OTHER): Payer: BC Managed Care – PPO | Admitting: Neurology

## 2014-10-15 ENCOUNTER — Encounter: Payer: Self-pay | Admitting: Neurology

## 2014-10-15 VITALS — BP 124/88 | HR 70 | Resp 16 | Ht 69.0 in | Wt 280.0 lb

## 2014-10-15 DIAGNOSIS — G43009 Migraine without aura, not intractable, without status migrainosus: Secondary | ICD-10-CM | POA: Diagnosis not present

## 2014-10-15 DIAGNOSIS — G4733 Obstructive sleep apnea (adult) (pediatric): Secondary | ICD-10-CM

## 2014-10-15 DIAGNOSIS — Z9989 Dependence on other enabling machines and devices: Principal | ICD-10-CM

## 2014-10-15 NOTE — Patient Instructions (Addendum)
Please remember to try to maintain good sleep hygiene, which means: Keep a regular sleep and wake schedule, try not to exercise or have a meal within 2 hours of your bedtime, try to keep your bedroom conducive for sleep, that is, cool and dark, without light distractors such as an illuminated alarm clock, and refrain from watching TV right before sleep or in the middle of the night and do not keep the TV or radio on during the night. Also, try not to use or play on electronic devices at bedtime, such as your cell phone, tablet PC or laptop. If you like to read at bedtime on an electronic device, try to dim the background light as much as possible. Do not eat in the middle of the night.   Please continue using your CPAP regularly. While your insurance requires that you use CPAP at least 4 hours each night on 70% of the nights, I recommend, that you not skip any nights and use it throughout the night if you can. Getting used to CPAP and staying with the treatment long term does take time and patience and discipline. Untreated obstructive sleep apnea when it is moderate to severe can have an adverse impact on cardiovascular health and raise her risk for heart disease, arrhythmias, hypertension, congestive heart failure, stroke and diabetes. Untreated obstructive sleep apnea causes sleep disruption, nonrestorative sleep, and sleep deprivation. This can have an impact on your day to day functioning and cause daytime sleepiness and impairment of cognitive function, memory loss, mood disturbance, and problems focussing. Using CPAP regularly can improve these symptoms.  Let's consider re-evaluation of your sleep apnea after 6 months.

## 2014-10-15 NOTE — Progress Notes (Signed)
Subjective:    Patient ID: Monique Shaw is a 34 y.o. female.  HPI     Interim history:   [name] is a very pleasant 34 year old right-handed woman with an underlying medical history of anxiety, depression, obesity, allergic rhinitis, and vitamin D deficiency, who presents for followup consultation of her recurrent migraine headaches and her mild obstructive sleep apnea. She is unaccompanied today. I last saw her on 03/09/2014, at which time she reported that she was more compliant with CPAP therapy since November 2015. She had gone to urgent care. She was supposed to start a new job at [employer]. 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 She endorsed sleeping better with CPAP. She had undergone laser vein surgery in September 2015.   She called the on-call doctor on 03/10/2014 reporting a migraine headache that did not respond to oral Imitrex. She was advised to try Flexeril in addition to Zofran and nasal sumatriptan as opposed to oral Imitrex.  In the interim, she presented to the emergency room on 08/20/2014 with chest pain. I reviewed the emergency room records. Her chest pain was deemed noncardiac in nature. Chest x-ray was negative. Labs were benign, including negative troponin.  Of note, she no showed for an appointment on 09/08/2014.   Today, 10/15/2014: I reviewed her CPAP compliance data from 09/13/2014 through 10/12/2014 which is a total of 30 days during which time she used her CPAP machine only 17 days with percent used days greater than 4 hours at only 30%, indicating poor compliance with an average usage on days used of 3 hours and 11 minutes. Residual AHI borderline at 5.4 per hour, leak low for the 95th percentile at 10.9 L/m on a pressure of 5 cm.  Today, 10/15/2014: She reports doing reasonably well, especially with regards to her headaches which seem to be under control  at this point. She's not using her CPAP regularly. She is also not keeping the great sleep and wake schedule currently during the summer time as she is off. She does not get enough sleep. She has not been able to lose any significant amount of weight. In fact when she had her sleep study in May 2014 she weighed exactly the same. She has tried different weight loss medications but some caused increases in her blood pressure. She is quite frustrated about her weight issues. Her sister was able to lose weight when she started Topamax but she has never had much in the way of response to Topamax with regards to her weight and even Wellbutrin did not cause her to lose any significant amount of weight. She has more anxiety lately. She continues to see Dr. Toy Care.    Previously:  I saw her on 09/10/2013, at which time she reported worsening headaches because of her work-related schedule most likely. She reported sleeping only 4-5 hours per night. I encouraged her to be fully compliant with CPAP therapy. I prescribed Imitrex as needed and Phenergan as needed for migraines.  I reviewed her compliance data from 02/01/2014 through 03/02/2014 which is a total of 30 days during which time she used her CPAP only 21 days. Percent used days greater than 4 hours was only 23%, indicating poor compliance. Average usage for all days of only 2 hours and 12 minutes. Average usage for days on device was 3 hours and 9 minutes. AHI 0.6 per hour, leaked low at 7.1 L  per minute for the 95th percentile and pressure at 5 cm.  I saw her on 03/09/2013, at which time she indicated good results with CPAP and good tolerance of the pressure and mask but requested to try a nasal pillows mask as opposed to a nasal mask. We talked about her sleep test results as well as her compliance data at the time. We talked about weight loss as well. She felt that her headaches were improved after CPAP treatment for OSA. In the interim, I reviewed the  patient's CPAP compliance data from 03/21/2013 to 04/19/2013, which is a total of 30 days, during which time the patient used CPAP only on 16 days. The average usage for all days was therefore low at 1 hour and 8 minutes. The percent used days greater than 4 hours was 7 %, indicating poor compliance. The residual AHI was 1.1 per hour, indicating an appropriate treatment pressure of 5 cwp.   I reviewed her compliance data from 08/10/2013 through 09/08/2013 which is a total of 30 days during which time she is CPAP only 19 days with percent used days greater than 4 hours of only 13%, average usage of only 2 hours, AHI low at 1.3 per hour, leak low at 12.1 at the 95th percentile.  I saw her on 08/13/2012, at which time I discussed her sleep study results with her from 07/24/2012. She was advised to consider using CPAP for treatment of her OSA. She had a CPAP titration study on 08/28/2012: Her sleep efficiency was normal at nearly 94%. She had no significant periodic leg movements of sleep. She had an increased percentage of REM sleep. She had a reduced REM latency. She had elimination of her sleep disordered breathing on a pressure of 5 cm and was started on CPAP based on the test results.   I reviewed her compliance data from 10/17/2012 through 11/15/2012 which is a total of 30 days during which time she used CPAP 21 days. Her percent used days greater than 4 hours was only 53%. She had a residual AHI of 1.7 per hour. Her average usage was 3 hours and 35 minutes only.   She presented to the emergency room on 02/22/2013 due to the upper respiratory symptoms. She had a chest x-ray which was negative. She was advised to treat symptomatically with oral rehydration and oral NSAIDs.   I had first met her on 07/24/2012 at the request of Dr. Dagmar Hait for complaint of daytime somnolence and nonrestorative sleep. She has an underlying history of allergies, depression, obesity, hypertension, migraine headaches and anxiety. I  suggested a sleep study which she had on 07/24/2012. Sleep efficiency was reduced at 78% with a normal sleep latency of 13.5 minutes. She had an increased amount of wake after sleep onset at 78.5 minutes with moderate sleep fragmentation noted. She had an increased percentage of stage II sleep, near absence of slow wave sleep and absence of REM sleep. She had mild to moderate snoring. Her AHI was 6.6 per hour, rising to 10.8 per hour in the supine position. Her baseline oxygen saturation was 94%, her nadir was 90%. I explained to her that the absence of REM sleep may underestimate her underlying obstructive sleep apnea. Her REM sleep may have been suppressed due to medication effect, particularly Cymbalta and Xanax. Benzodiazepines tend to increase stage II sleep.   Her brain MRI without contrast from 08/07/2012 was reported as normal.    Her Past Medical History Is Significant For: Past Medical History  Diagnosis Date  . Allergy   . Depression   . H/O toxoplasmosis   . Rubella   . H/O varicella   . Blood type, Rh negative   . Obese   . Irregular menstrual cycle 2004  . Weight gain 2004  . Hx: UTI (urinary tract infection) 2007  . ASCVD (arteriosclerotic cardiovascular disease) 2011  . Hypertension   . History of elevated lipids 2011  . H/O migraine 10/25/10  . Anxiety   . Sleep apnea   . Migraine   . Asthma     Her Past Surgical History Is Significant For: Past Surgical History  Procedure Laterality Date  . Wisdom tooth extraction      Her Family History Is Significant For: Family History  Problem Relation Age of Onset  . Heart disease Mother   . Hypertension Mother   . Cancer Mother   . Hypertension Father   . Anemia Father   . Cancer Maternal Uncle   . Cancer Paternal Aunt   . Alcohol abuse Paternal Uncle   . Hypertension Maternal Grandmother   . Heart disease Paternal Grandfather     MI    Her Social History Is Significant For: History   Social History  . Marital  Status: Married    Spouse Name: Herbie [place]  . Number of Children: 1  . Years of Education: M. ED   Occupational History  .      Teacher   Social History Main Topics  . Smoking status: Never Smoker   . Smokeless tobacco: Never Used  . Alcohol Use: No  . Drug Use: No  . Sexual Activity: Yes    Birth Control/ Protection: Pill     Comment: ALESSE   Other Topics Concern  . None   Social History Narrative   Pt lives at home with husband Herbie [place]) and son   Pt is right handed    Education- Masters   Caffeine- occasional soda or tea but not every day    Her Allergies Are:  No Known Allergies:   Her Current Medications Are:  Outpatient Encounter Prescriptions as of 10/15/2014  Medication Sig  . ALPRAZolam (XANAX) 0.25 MG tablet Take 0.25 mg by mouth at bedtime as needed for sleep.  Marland Kitchen buPROPion (WELLBUTRIN XL) 150 MG 24 hr tablet Take 150 mg by mouth.  . BuPROPion HCl (WELLBUTRIN PO) Take 50 mg by mouth.  . ergocalciferol (VITAMIN D2) 50000 UNITS capsule Take 50,000 Units by mouth once a week.  Marland Kitchen levonorgestrel-ethinyl estradiol (AVIANE,ALESSE,LESSINA) 0.1-20 MG-MCG tablet Take 1 tablet by mouth daily.  . naltrexone (DEPADE) 50 MG tablet TAKE 1 TABLET (50 MG TOTAL) BY MOUTH DAILY.  Marland Kitchen ondansetron (ZOFRAN-ODT) 4 MG disintegrating tablet Take 1 tablet (4 mg total) by mouth every 8 (eight) hours as needed for nausea or vomiting.  . phentermine (ADIPEX-P) 37.5 MG tablet TAKE 1/2 TO 1 TAB BY MOUTH IN THE MORNING.  . promethazine (PHENERGAN) 25 MG tablet Take 1 tablet (25 mg total) by mouth every 8 (eight) hours as needed for nausea.  . propranolol (INDERAL) 40 MG tablet TAKE 1 TABLET (40 MG TOTAL) BY MOUTH 2 TIMES DAILY.  . SUMAtriptan (IMITREX) 20 MG/ACT nasal spray Place 1 spray (20 mg total) into the nose every 2 (two) hours as needed for migraine or headache. May repeat in 2 hours if headache persists or recurs.  . SUMAtriptan (IMITREX) 50 MG tablet Take 1 tablet (50 mg total) by mouth  once. May repeat in 2 hours  if needed. No more than 2 per 24 hours. No more than 3 times per week.  . topiramate (TOPAMAX) 50 MG tablet TAKE 2 TABLETS BY MOUTH IN THE MORNING AND 3 TABS BEFORE BED DAILY  . [DISCONTINUED] Amphetamine-Dextroamphetamine (ADDERALL PO) Take by mouth.  . [DISCONTINUED] Armodafinil (NUVIGIL) 150 MG tablet Take 150 mg by mouth daily.  . [DISCONTINUED] cyclobenzaprine (FLEXERIL) 10 MG tablet Take 1 tablet (10 mg total) by mouth 3 (three) times daily as needed for muscle spasms. And migraine.  . [DISCONTINUED] orlistat (XENICAL) 120 MG capsule Take 120 mg by mouth 3 (three) times daily with meals.  . [DISCONTINUED] Topiramate (TOPAMAX PO) Take by mouth.  . [DISCONTINUED] topiramate (TOPAMAX) 25 MG tablet Take 25 mg by mouth.   Facility-Administered Encounter Medications as of 10/15/2014  Medication  . ondansetron (ZOFRAN-ODT) disintegrating tablet 4 mg  :  Review of Systems:  Out of a complete 14 point review of systems, all are reviewed and negative with the exception of these symptoms as listed below:  Review of Systems  All other systems reviewed and are negative.  Objective:  Neurologic Exam  Physical Exam Physical Examination:   Filed Vitals:   10/15/14 0834  BP: 124/88  Pulse: 70  Resp: 16   General Examination: The patient is a very pleasant 34 y.o. female in no acute distress. She appears well-developed and well-nourished and well groomed. She is obese. She is in less good spirits today, frustrated about her inability recently to lose weight and recent increases in blood pressure values.   HEENT: Normocephalic, atraumatic, pupils are equal, round and reactive to light and accommodation. Funduscopic exam is normal with sharp disc margins noted. Extraocular tracking is good without limitation to gaze excursion or nystagmus noted. Normal smooth pursuit is noted. Hearing is grossly intact. Face is symmetric with normal facial animation and normal facial  sensation. Speech is clear with no dysarthria noted. There is no hypophonia. There is no lip, neck/head, jaw or voice tremor. Neck is supple with full range of passive and active motion. There are no carotid bruits on auscultation. Oropharynx exam reveals: mild mouth dryness, mild pharyngeal erythema, good dental hygiene and mild airway crowding, due to larger tongue and wider based uvula. Mallampati is class I. Tongue protrudes centrally and palate elevates symmetrically. Nasal inspection reveals mucosal erythema and bogginess.   Chest: Clear to auscultation without wheezing, rhonchi or crackles noted.  Heart: S1+S2+0, regular and normal without murmurs, rubs or gallops noted.   Abdomen: Soft, non-tender and non-distended with normal bowel sounds appreciated on auscultation.  Extremities: There is no pitting edema in the distal lower extremities bilaterally.   Skin: Warm and dry without trophic changes noted. There are no varicose veins, she had vein laser surgery in 9/15.   Musculoskeletal: exam reveals no obvious joint deformities, tenderness or joint swelling or erythema.   Neurologically:  Mental status: The patient is awake, alert and oriented in all 4 spheres. Her memory, attention, language and knowledge are appropriate. There is no aphasia, agnosia, apraxia or anomia. Speech is clear with normal prosody and enunciation. Thought process is linear. Mood is congruent and affect is normal.  Cranial nerves are as described above under HEENT exam. In addition, shoulder shrug is normal with equal shoulder height noted. Motor exam: Normal bulk, strength and tone is noted. There is no drift, tremor or rebound. Romberg is negative. Reflexes are 2+ throughout. Fine motor skills are intact with normal finger taps, normal hand movements, normal rapid  alternating patting, normal foot taps and normal foot agility.  Cerebellar testing shows no dysmetria or intention tremor on finger to nose testing. There  is no truncal or gait ataxia.  Sensory exam is intact to light touch, vibration, temperature sense in the upper and lower extremities.  Gait, station and balance are unremarkable. No veering to one side is noted. No leaning to one side is noted. Posture is age-appropriate and stance is narrow based. No problems turning are noted. She turns en bloc. Tandem walk is unremarkable.              Assessment and Plan:   In summary, Yola [place] Shaw is a very pleasant 34 year old female with a history of recurrent headaches, improved after treatment of her mild OSA with CPAP of 5 cwp, with mediocre to poor compliance. She has not been able to lose weight. She has had milder increases in her blood pressure values. She is encouraged to continue with CPAP at this time. Of note, her baseline sleep study in May 2014 did not show any REM sleep, leaving her OSA likely underestimated. Her physical exam is stable and nonfocal. Thankfully, headache-wise, she is doing well.   we talked again about headache triggers. She's also advised to pursue better sleep hygiene. She is encouraged to continue to work on weight loss. She is advised that we can reevaluate her sleep apnea with another sleep study down the road. I suggested a 6 month follow-up, sooner if the need arises. I answered all her questions today and she was in agreement. I spent 25 minutes in total face-to-face time with the patient, more than 50% of which was spent in counseling and coordination of care, reviewing test results, reviewing medication and discussing or reviewing the diagnosis of recurrent migraines, OSA, the prognosis and treatment options.

## 2015-03-03 ENCOUNTER — Ambulatory Visit (INDEPENDENT_AMBULATORY_CARE_PROVIDER_SITE_OTHER): Payer: BC Managed Care – PPO | Admitting: Emergency Medicine

## 2015-03-03 VITALS — BP 122/76 | HR 123 | Temp 98.4°F | Resp 16 | Ht 70.0 in | Wt 276.0 lb

## 2015-03-03 DIAGNOSIS — R509 Fever, unspecified: Secondary | ICD-10-CM | POA: Diagnosis not present

## 2015-03-03 LAB — POCT RAPID STREP A (OFFICE): Rapid Strep A Screen: NEGATIVE

## 2015-03-03 NOTE — Progress Notes (Signed)
Subjective:  Patient ID: Monique Shaw [place], female    DOB: 08-27-1980  Age: 34 y.o. MRN: [id number]  CC: Headache; Sinus Problem; Chills; Fever; Generalized Body Aches; and Ear Pain   HPI Monique Shaw [place] presents   She has nasal congestion postnasal drainage with nasal discharge is watery in color. She has sore throat. She has no cough wheezing shortness of breath. No nausea vomiting. No stool change. She has been feeling achy and has no she had a fever but she's not had a documented fever or chills.  She has muscle aches andjoint pains.  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   She has past surgical history that includes Wisdom tooth extraction and gastroenteritis.   Her  family history includes Alcohol abuse in her paternal uncle; Anemia in her father; Cancer in her maternal uncle, mother, and paternal aunt; Heart disease in her mother and paternal grandfather; Hypertension in her father, maternal grandmother, and mother.  She   reports that she has never smoked. She has never used smokeless tobacco. She reports that she does not drink alcohol or use illicit drugs.  Outpatient Prescriptions Prior to Visit  Medication Sig Dispense Refill  . ergocalciferol (VITAMIN D2) 50000 UNITS capsule Take 50,000 Units by mouth once a week.    Marland Kitchen. levonorgestrel-ethinyl estradiol (AVIANE,ALESSE,LESSINA) 0.1-20 MG-MCG tablet Take 1 tablet by mouth daily.    . ondansetron (ZOFRAN-ODT) 4 MG disintegrating tablet Take 1 tablet (4  mg total) by mouth every 8 (eight) hours as needed for nausea or vomiting. 30 tablet 3  . promethazine (PHENERGAN) 25 MG tablet Take 1 tablet (25 mg total) by mouth every 8 (eight) hours as needed for nausea. 30 tablet 1  . propranolol (INDERAL) 40 MG tablet TAKE 1 TABLET (40 MG TOTAL) BY MOUTH 2 TIMES DAILY.  2  . SUMAtriptan (IMITREX) 20 MG/ACT nasal spray Place 1 spray (20 mg total) into the nose every 2 (two) hours as needed for migraine or headache. May repeat in 2 hours if headache persists or recurs. 1 Inhaler 3  . SUMAtriptan (IMITREX) 50 MG tablet Take 1 tablet (50 mg total) by mouth once. May repeat in 2 hours if needed. No more than 2 per 24 hours. No more than 3 times per week. 10 tablet 3  . topiramate (TOPAMAX) 50 MG tablet TAKE 2 TABLETS BY MOUTH IN THE MORNING AND 3 TABS BEFORE BED DAILY  2  . ALPRAZolam (XANAX) 0.25 MG tablet Take 0.25 mg by mouth at bedtime as needed for sleep. Reported on 03/03/2015    . buPROPion (WELLBUTRIN XL) 150 MG 24 hr tablet Take 150 mg by mouth. Reported on 03/03/2015    . BuPROPion HCl (WELLBUTRIN PO) Take 50 mg by mouth. Reported on 03/03/2015    . naltrexone (DEPADE) 50 MG tablet Reported on 03/03/2015  2  . phentermine (ADIPEX-P) 37.5 MG tablet Reported on 03/03/2015  1   Facility-Administered Medications Prior to Visit  Medication Dose Route Frequency Provider Last Rate Last Dose  . ondansetron (ZOFRAN-ODT) disintegrating tablet 4 mg  4 mg Oral Once Thao P Le, DO  Social History   Social History  . Marital Status: Married    Spouse Name: Molly Maduro  . Number of Children: 1  . Years of Education: M. ED   Occupational History  .      Teacher   Social History Main Topics  . Smoking status: Never Smoker   . Smokeless tobacco: Never Used  . Alcohol Use: No  . Drug Use: No  . Sexual Activity: Yes    Birth Control/ Protection: Pill     Comment: ALESSE   Other Topics Concern  . None   Social History Narrative   Pt lives at home  with husband Molly Maduro) and son   Pt is right handed    Education- Masters   Caffeine- occasional soda or tea but not every day     Review of Systems  Constitutional: Positive for fever, chills, appetite change and fatigue.  HENT: Positive for congestion, ear pain, rhinorrhea, sinus pressure and sore throat. Negative for postnasal drip.   Eyes: Negative for pain and redness.  Respiratory: Negative for cough, shortness of breath and wheezing.   Cardiovascular: Negative for leg swelling.  Gastrointestinal: Negative for nausea, vomiting, abdominal pain, diarrhea, constipation and blood in stool.  Endocrine: Negative for polyuria.  Genitourinary: Negative for dysuria, urgency, frequency and flank pain.  Musculoskeletal: Negative for gait problem.  Skin: Negative for rash.  Neurological: Negative for weakness and headaches.  Psychiatric/Behavioral: Negative for confusion and decreased concentration. The patient is not nervous/anxious.     Objective:  BP 122/76 mmHg  Pulse 123  Temp(Src) 98.4 F (36.9 C) (Oral)  Resp 16  Ht  (1.778 m)  Wt 276 lb (125.193 kg)  BMI 39.60 kg/m2  SpO2 98%  LMP 03/02/2015 (Exact Date)  Physical Exam  Constitutional: She is oriented to person, place, and time. She appears well-developed and well-nourished. No distress.  HENT:  Head: Normocephalic and atraumatic.  Right Ear: External ear normal.  Left Ear: External ear normal.  Nose: Nose normal.  Eyes: Conjunctivae and EOM are normal. Pupils are equal, round, and reactive to light. No scleral icterus.  Neck: Normal range of motion. Neck supple. No tracheal deviation present.  Cardiovascular: Normal rate, regular rhythm and normal heart sounds.   Pulmonary/Chest: Effort normal. No respiratory distress. She has no wheezes. She has no rales.  Abdominal: She exhibits no mass. There is no tenderness. There is no rebound and no guarding.  Musculoskeletal: She exhibits no edema.  Lymphadenopathy:     She has no cervical adenopathy.  Neurological: She is alert and oriented to person, place, and time. Coordination normal.  Skin: Skin is warm and dry. No rash noted.  Psychiatric: She has a normal mood and affect. Her behavior is normal.      Assessment & Plan:   Dymin was seen today for headache, sinus problem, chills, fever, generalized body aches and ear pain.  Diagnoses and all orders for this visit:  Fever, unspecified fever cause -     POCT rapid strep A  I am having Ms. Shaw [place] maintain her ALPRAZolam, ergocalciferol, levonorgestrel-ethinyl estradiol, BuPROPion HCl (WELLBUTRIN PO), promethazine, buPROPion, ondansetron, SUMAtriptan, SUMAtriptan, naltrexone, topiramate, propranolol, phentermine, and Vortioxetine HBr. We will continue to administer ondansetron.  Meds ordered this encounter  Medications  . Vortioxetine HBr 10 MG TABS    Sig: Take by mouth.    Appropriate red flag conditions were discussed with the patient as well as actions that should be taken.  Patient expressed his understanding.  Follow-up: Return if symptoms worsen or fail to improve.  Carmelina Dane, MD   Results for orders placed or performed in visit on 03/03/15  POCT rapid strep A  Result Value Ref Range   Rapid Strep A Screen Negative Negative

## 2015-03-03 NOTE — Patient Instructions (Signed)
Fever, Adult A fever is an increase in the body's temperature. It is usually defined as a temperature of 100F (38C) or higher. Brief mild or moderate fevers generally have no long-term effects, and they often do not require treatment. Moderate or high fevers may make you feel uncomfortable and can sometimes be a sign of a serious illness or disease. The sweating that may occur with repeated or prolonged fever may also cause dehydration. Fever is confirmed by taking a temperature with a thermometer. A measured temperature can vary with:  Age.  Time of day.  Location of the thermometer:  Mouth (oral).  Rectum (rectal).  Ear (tympanic).  Underarm (axillary).  Forehead (temporal). HOME CARE INSTRUCTIONS Pay attention to any changes in your symptoms. Take these actions to help with your condition:  Take over-the counter and prescription medicines only as told by your health care provider. Follow the dosing instructions carefully.  If you were prescribed an antibiotic medicine, take it as told by your health care provider. Do not stop taking the antibiotic even if you start to feel better.  Rest as needed.  Drink enough fluid to keep your urine clear or pale yellow. This helps to prevent dehydration.  Sponge yourself or bathe with room-temperature water to help reduce your body temperature as needed. Do not use ice water.  Do not overbundle yourself in blankets or heavy clothes. SEEK MEDICAL CARE IF:  You vomit.  You cannot eat or drink without vomiting.  You have diarrhea.  You have pain when you urinate.  Your symptoms do not improve with treatment.  You develop new symptoms.  You develop excessive weakness. SEEK IMMEDIATE MEDICAL CARE IF:  You have shortness of breath or have trouble breathing.  You are dizzy or you faint.  You are disoriented or confused.  You develop signs of dehydration, such as a dry mouth, decreased urination, or paleness.  You develop  severe pain in your abdomen.  You have persistent vomiting or diarrhea.  You develop a skin rash.  Your symptoms suddenly get worse.   This information is not intended to replace advice given to you by your health care provider. Make sure you discuss any questions you have with your health care provider.   Document Released: 08/29/2000 Document Revised: 11/24/2014 Document Reviewed: 04/29/2014 Elsevier Interactive Patient Education 2016 [employer]  

## 2015-04-14 ENCOUNTER — Telehealth: Payer: Self-pay

## 2015-04-14 NOTE — Telephone Encounter (Signed)
LM for patient to bring in CPAP or SD card so we can get more recent CPAP download.

## 2015-04-18 ENCOUNTER — Encounter: Payer: Self-pay | Admitting: Neurology

## 2015-04-18 ENCOUNTER — Ambulatory Visit (INDEPENDENT_AMBULATORY_CARE_PROVIDER_SITE_OTHER): Payer: BC Managed Care – PPO | Admitting: Neurology

## 2015-04-18 VITALS — BP 122/74 | HR 76 | Resp 16 | Ht 69.0 in | Wt 272.0 lb

## 2015-04-18 DIAGNOSIS — G4733 Obstructive sleep apnea (adult) (pediatric): Secondary | ICD-10-CM | POA: Diagnosis not present

## 2015-04-18 DIAGNOSIS — G43009 Migraine without aura, not intractable, without status migrainosus: Secondary | ICD-10-CM

## 2015-04-18 MED ORDER — SUMATRIPTAN SUCCINATE 50 MG PO TABS: 50.0000 mg | ORAL_TABLET | Freq: Once | ORAL | Status: DC

## 2015-04-18 NOTE — Progress Notes (Signed)
Subjective:    Patient ID: Monique Shaw is a 35 y.o. female.  HPI     Interim history:  [name] is a very pleasant 35 year old right-handed woman with an underlying medical history of anxiety, depression, obesity, allergic rhinitis, and vitamin D deficiency, who presents for followup consultation of her recurrent migraine headaches and her mild obstructive sleep apnea. She is unaccompanied today. I last saw her on 10/15/2014, at which time she reported doing reasonably well. With regards to her headaches she felt that they were under control at the time. She was not using her CPAP regularly. She was also not keeping the great sleep and wake schedule. She was not always getting enough sleep. Unfortunately, she had not been able to lose any significant amount of weight. Compared to when she had her sleep study in May 2014, her weight was the same. She had in the interim tried weight loss medications but they caused increases in her blood pressure. Topamax did not help with weight loss and Wellbutrin did not help either in that regard. She had more anxiety issues and continued to see her psychiatrist, Dr. Toy Care. I asked her to try to use CPAP as best as possible. We also talked about bringing her back for another sleep study for reevaluation. She was advised regarding sleep hygiene as well.  Today, 04/18/2015: I reviewed her CPAP compliance data from 12/03/2014 through 03/02/2015 which is a total of 90 days during which time she used her machine only 8 times with percent used days greater than 4 hours at 0%, indicating noncompliance. She reports that she had GI issues, starting in August 2016 and she had bouts of vomiting. She was in the Hospital in [place]. She tested positive for Norovirus in December. She had the flu in December. She has made some changes in her diet, gluten free, chicken free, no more oranges, no cows milk. She was also diagnosed with postinfectious  irritable bowel syndrome.   Previously:   I saw her on 03/09/2014, at which time she reported that she was more compliant with CPAP therapy since November 2015. She had gone to urgent care. She was supposed to start a new job at [employer]. 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 She endorsed sleeping better with CPAP. She had undergone laser vein surgery in September 2015.   She called the on-call doctor on 03/10/2014 reporting a migraine headache that did not respond to oral Imitrex. She was advised to try Flexeril in addition to Zofran and nasal sumatriptan as opposed to oral Imitrex.  In the interim, she presented to the emergency room on 08/20/2014 with chest pain. I reviewed the emergency room records. Her chest pain was deemed noncardiac in nature. Chest x-ray was negative. Labs were benign, including negative troponin.  Of note, she no showed for an appointment on 09/08/2014.   I reviewed her CPAP compliance data from 09/13/2014 through 10/12/2014 which is a total of 30 days during which time she used her CPAP machine only 17 days with percent used days greater than 4 hours at only 30%, indicating poor compliance with an average usage on days used of 3 hours and 11 minutes. Residual AHI borderline at 5.4 per hour, leak low for the 95th percentile at 10.9 L/m on a pressure of 5 cm.  I saw her on 09/10/2013, at which time she reported worsening headaches because of her work-related  schedule most likely. She reported sleeping only 4-5 hours per night. I encouraged her to be fully compliant with CPAP therapy. I prescribed Imitrex as needed and Phenergan as needed for migraines.  I reviewed her compliance data from 02/01/2014 through 03/02/2014 which is a total of 30 days during which time she used her CPAP only 21 days. Percent used days greater than 4 hours was only 23%, indicating poor compliance. Average usage for  all days of only 2 hours and 12 minutes. Average usage for days on device was 3 hours and 9 minutes. AHI 0.6 per hour, leaked low at 7.1 L per minute for the 95th percentile and pressure at 5 cm.  I saw her on 03/09/2013, at which time she indicated good results with CPAP and good tolerance of the pressure and mask but requested to try a nasal pillows mask as opposed to a nasal mask. We talked about her sleep test results as well as her compliance data at the time. We talked about weight loss as well. She felt that her headaches were improved after CPAP treatment for OSA. In the interim, I reviewed the patient's CPAP compliance data from 03/21/2013 to 04/19/2013, which is a total of 30 days, during which time the patient used CPAP only on 16 days. The average usage for all days was therefore low at 1 hour and 8 minutes. The percent used days greater than 4 hours was 7 %, indicating poor compliance. The residual AHI was 1.1 per hour, indicating an appropriate treatment pressure of 5 cwp.   I reviewed her compliance data from 08/10/2013 through 09/08/2013 which is a total of 30 days during which time she is CPAP only 19 days with percent used days greater than 4 hours of only 13%, average usage of only 2 hours, AHI low at 1.3 per hour, leak low at 12.1 at the 95th percentile.  I saw her on 08/13/2012, at which time I discussed her sleep study results with her from 07/24/2012. She was advised to consider using CPAP for treatment of her OSA. She had a CPAP titration study on 08/28/2012: Her sleep efficiency was normal at nearly 94%. She had no significant periodic leg movements of sleep. She had an increased percentage of REM sleep. She had a reduced REM latency. She had elimination of her sleep disordered breathing on a pressure of 5 cm and was started on CPAP based on the test results.   I reviewed her compliance data from 10/17/2012 through 11/15/2012 which is a total of 30 days during which time she used CPAP  21 days. Her percent used days greater than 4 hours was only 53%. She had a residual AHI of 1.7 per hour. Her average usage was 3 hours and 35 minutes only.   She presented to the emergency room on 02/22/2013 due to the upper respiratory symptoms. She had a chest x-ray which was negative. She was advised to treat symptomatically with oral rehydration and oral NSAIDs.   I had first met her on 07/24/2012 at the request of Dr. Dagmar Hait for complaint of daytime somnolence and nonrestorative sleep. She has an underlying history of allergies, depression, obesity, hypertension, migraine headaches and anxiety. I suggested a sleep study which she had on 07/24/2012. Sleep efficiency was reduced at 78% with a normal sleep latency of 13.5 minutes. She had an increased amount of wake after sleep onset at 78.5 minutes with moderate sleep fragmentation noted. She had an increased percentage of stage II sleep, near absence  of slow wave sleep and absence of REM sleep. She had mild to moderate snoring. Her AHI was 6.6 per hour, rising to 10.8 per hour in the supine position. Her baseline oxygen saturation was 94%, her nadir was 90%. I explained to her that the absence of REM sleep may underestimate her underlying obstructive sleep apnea. Her REM sleep may have been suppressed due to medication effect, particularly Cymbalta and Xanax. Benzodiazepines tend to increase stage II sleep.   Her brain MRI without contrast from 08/07/2012 was reported as normal.     Her Past Medical History Is Significant For: Past Medical History  Diagnosis Date  . Allergy   . Depression   . H/O toxoplasmosis   . Rubella   . H/O varicella   . Blood type, Rh negative   . Obese   . Irregular menstrual cycle 2004  . Weight gain 2004  . Hx: UTI (urinary tract infection) 2007  . ASCVD (arteriosclerotic cardiovascular disease) 2011  . Hypertension   . History of elevated lipids 2011  . H/O migraine 10/25/10  . Anxiety   . Sleep apnea   .  Migraine   . Asthma   . GERD (gastroesophageal reflux disease)   . IBS (irritable bowel syndrome)     Her Past Surgical History Is Significant For: Past Surgical History  Procedure Laterality Date  . Wisdom tooth extraction    . Gastroenteritis      Her Family History Is Significant For: Family History  Problem Relation Age of Onset  . Heart disease Mother   . Hypertension Mother   . Cancer Mother   . Hypertension Father   . Anemia Father   . Cancer Maternal Uncle   . Cancer Paternal Aunt   . Alcohol abuse Paternal Uncle   . Hypertension Maternal Grandmother   . Heart disease Paternal Grandfather     MI    Her Social History Is Significant For: Social History   Social History  . Marital Status: Married    Spouse Name: Herbie Baltimore  . Number of Children: 1  . Years of Education: M. ED   Occupational History  .      Teacher   Social History Main Topics  . Smoking status: Never Smoker   . Smokeless tobacco: Never Used  . Alcohol Use: No  . Drug Use: No  . Sexual Activity: Yes    Birth Control/ Protection: Pill     Comment: ALESSE   Other Topics Concern  . None   Social History Narrative   Pt lives at home with husband Herbie Baltimore) and son   Pt is right handed    Education- Masters   Caffeine- occasional soda or tea but not every day    Her Allergies Are:  No Known Allergies:   Her Current Medications Are:  Outpatient Encounter Prescriptions as of 04/18/2015  Medication Sig  . ALPRAZolam (XANAX) 0.25 MG tablet Take 0.25 mg by mouth at bedtime as needed for sleep. Reported on 03/03/2015  . amphetamine-dextroamphetamine (ADDERALL) 30 MG tablet Take 30 mg by mouth 2 (two) times daily.  . ergocalciferol (VITAMIN D2) 50000 UNITS capsule Take 50,000 Units by mouth once a week.  Marland Kitchen levonorgestrel-ethinyl estradiol (AVIANE,ALESSE,LESSINA) 0.1-20 MG-MCG tablet Take 1 tablet by mouth daily.  . ondansetron (ZOFRAN-ODT) 4 MG disintegrating tablet Take 1 tablet (4 mg total)  by mouth every 8 (eight) hours as needed for nausea or vomiting.  . promethazine (PHENERGAN) 25 MG tablet Take 1 tablet (25 mg  total) by mouth every 8 (eight) hours as needed for nausea.  . propranolol (INDERAL) 40 MG tablet TAKE 1 TABLET (40 MG TOTAL) BY MOUTH 2 TIMES DAILY.  . SUMAtriptan (IMITREX) 20 MG/ACT nasal spray Place 1 spray (20 mg total) into the nose every 2 (two) hours as needed for migraine or headache. May repeat in 2 hours if headache persists or recurs.  . SUMAtriptan (IMITREX) 50 MG tablet Take 1 tablet (50 mg total) by mouth once. May repeat in 2 hours if needed. No more than 2 per 24 hours. No more than 3 times per week.  . topiramate (TOPAMAX) 50 MG tablet TAKE 2 TABLETS BY MOUTH IN THE MORNING AND 3 TABS BEFORE BED DAILY  . TRINTELLIX 5 MG TABS Take 3 tablets by mouth daily.  . [DISCONTINUED] buPROPion (WELLBUTRIN XL) 150 MG 24 hr tablet Take 150 mg by mouth. Reported on 03/03/2015  . [DISCONTINUED] BuPROPion HCl (WELLBUTRIN PO) Take 50 mg by mouth. Reported on 03/03/2015  . [DISCONTINUED] naltrexone (DEPADE) 50 MG tablet Reported on 03/03/2015  . [DISCONTINUED] phentermine (ADIPEX-P) 37.5 MG tablet Reported on 03/03/2015  . [DISCONTINUED] Vortioxetine HBr 10 MG TABS Take by mouth.   Facility-Administered Encounter Medications as of 04/18/2015  Medication  . ondansetron (ZOFRAN-ODT) disintegrating tablet 4 mg  :  Review of Systems:  Out of a complete 14 point review of systems, all are reviewed and negative with the exception of these symptoms as listed below:   Review of Systems  Neurological:       Patient feels like her migraines are well controlled. She confesses that she does not use her CPAP every night. She needs a new mask.      Objective:  Neurologic Exam  Physical Exam Physical Examination:   Filed Vitals:   04/18/15 1552  BP: 122/74  Pulse: 76  Resp: 16   General Examination: The patient is a very pleasant 35 y.o. female in no acute distress. She  appears well-developed and well-nourished and well groomed. She is obese. She is in good spirits today.   HEENT: Normocephalic, atraumatic, pupils are equal, round and reactive to light and accommodation. Funduscopic exam is normal with sharp disc margins noted. Extraocular tracking is good without limitation to gaze excursion or nystagmus noted. Normal smooth pursuit is noted. Hearing is grossly intact. Face is symmetric with normal facial animation and normal facial sensation. Speech is clear with no dysarthria noted. There is no hypophonia. There is no lip, neck/head, jaw or voice tremor. Neck is supple with full range of passive and active motion. There are no carotid bruits on auscultation. Oropharynx exam reveals: mild mouth dryness, mild pharyngeal erythema, good dental hygiene and mild airway crowding, due to larger tongue and wider based uvula. Mallampati is class I. Tongue protrudes centrally and palate elevates symmetrically. Nasal inspection reveals mucosal erythema and bogginess.   Chest: Clear to auscultation without wheezing, rhonchi or crackles noted.  Heart: S1+S2+0, regular and normal without murmurs, rubs or gallops noted.   Abdomen: Soft, non-tender and non-distended with normal bowel sounds appreciated on auscultation.  Extremities: There is no pitting edema in the distal lower extremities bilaterally.   Skin: Warm and dry without trophic changes noted. There are no varicose veins (she had vein laser surgery in 9/15).   Musculoskeletal: exam reveals no obvious joint deformities, tenderness or joint swelling or erythema.   Neurologically:  Mental status: The patient is awake, alert and oriented in all 4 spheres. Her memory, attention, language and  knowledge are appropriate. There is no aphasia, agnosia, apraxia or anomia. Speech is clear with normal prosody and enunciation. Thought process is linear. Mood is congruent and affect is normal.  Cranial nerves are as described above  under HEENT exam. In addition, shoulder shrug is normal with equal shoulder height noted. Motor exam: Normal bulk, strength and tone is noted. There is no drift, tremor or rebound. Romberg is negative. Reflexes are 2+ throughout. Fine motor skills are intact with normal finger taps, normal hand movements, normal rapid alternating patting, normal foot taps and normal foot agility.  Cerebellar testing shows no dysmetria or intention tremor on finger to nose testing. There is no truncal or gait ataxia.  Sensory exam is intact to light touch in the upper and lower extremities.  Gait, station and balance are unremarkable. No veering to one side is noted. No leaning to one side is noted. Posture is age-appropriate and stance is narrow based. No problems turning are noted. She turns en bloc. Tandem walk is unremarkable.              Assessment and Plan:   In summary, Monique Shaw is a very pleasant 35 year old female with an underlying medical history of anxiety, depression, morbid obesity, and obstructive sleep apnea who presents for follow-up consultation of her migraines and sleep apnea. She has not been using her CPAP. Since her original study in May 2014, she has lost a little bit of weight. She has also made some dietary changes after she has had a GI infection and was also diagnosed with a several intolerances and postinfectious irritable bowel syndrome. She denies morning headaches at this time. Her migraine frequency is less than before. I think her dietary changes have helped in that regard. Her exam is nonfocal. I renewed her prescription for Imitrex generic, 50 mg strength as needed.  At this juncture, I suggested that we reevaluate her obstructive sleep apnea with a repeat sleep study. Her baseline sleep study from May 2014 showed no slow-wave sleep and particularly no REM sleep, likely rendering an underestimation of her sleep disordered breathing. I will see her back routinely in 6  months, sooner if needed. I answered all her questions today and she was in agreement. I spent 20 minutes in total face-to-face time with the patient, more than 50% of which was spent in counseling and coordination of care, reviewing test results, reviewing medication and discussing or reviewing the diagnosis of recurrent migraines, OSA, the prognosis and treatment options.

## 2015-04-18 NOTE — Patient Instructions (Signed)
We will continue with Imitrex as needed for migraines.   Let's do a repeat sleep study for re-check of your sleep apnea.

## 2015-04-29 ENCOUNTER — Ambulatory Visit (INDEPENDENT_AMBULATORY_CARE_PROVIDER_SITE_OTHER): Payer: BC Managed Care – PPO | Admitting: Neurology

## 2015-04-29 DIAGNOSIS — G4733 Obstructive sleep apnea (adult) (pediatric): Secondary | ICD-10-CM | POA: Diagnosis not present

## 2015-04-29 DIAGNOSIS — G4720 Circadian rhythm sleep disorder, unspecified type: Secondary | ICD-10-CM

## 2015-04-30 NOTE — Sleep Study (Signed)
Please see the scanned sleep study interpretation located in the procedure tab within the chart review section.   

## 2015-05-02 ENCOUNTER — Telehealth: Payer: Self-pay | Admitting: Neurology

## 2015-05-02 NOTE — Telephone Encounter (Signed)
Patient last seen by me on 04/18/15, diagnostic PSG on 04/29/15.   Please call and notify the patient that the recent sleep study did not show any significant obstructive sleep apnea. She does not have to use her CPAP any longer! Please inform patient that I can see her back for her HAs routinely sometime end of June or early July, please mask appt as none seems to be scheduled for her. Also, route or fax report to PCP.  Once you have spoken to patient, you can close this encounter.   Thanks,  Huston Foley, MD, PhD [facility])

## 2015-05-03 NOTE — Telephone Encounter (Signed)
I spoke to patient and she is aware. We made f/u appt in July. I have sent report to PCP.

## 2015-09-28 ENCOUNTER — Ambulatory Visit: Payer: Self-pay | Admitting: Neurology

## 2015-10-12 ENCOUNTER — Encounter: Payer: Self-pay | Admitting: Neurology

## 2015-10-27 ENCOUNTER — Ambulatory Visit: Payer: Self-pay | Admitting: Neurology

## 2016-01-17 ENCOUNTER — Ambulatory Visit (INDEPENDENT_AMBULATORY_CARE_PROVIDER_SITE_OTHER): Payer: BC Managed Care – PPO | Admitting: Physician Assistant

## 2016-01-17 ENCOUNTER — Encounter: Payer: Self-pay | Admitting: Physician Assistant

## 2016-01-17 VITALS — BP 112/84 | HR 102 | Temp 97.9°F | Resp 16 | Ht 69.0 in | Wt 280.0 lb

## 2016-01-17 DIAGNOSIS — J329 Chronic sinusitis, unspecified: Secondary | ICD-10-CM

## 2016-01-17 DIAGNOSIS — J310 Chronic rhinitis: Secondary | ICD-10-CM

## 2016-01-17 DIAGNOSIS — G43009 Migraine without aura, not intractable, without status migrainosus: Secondary | ICD-10-CM | POA: Diagnosis not present

## 2016-01-17 MED ORDER — MUCINEX DM MAXIMUM STRENGTH 60-1200 MG PO TB12: 1.0000 | ORAL_TABLET | Freq: Two times a day (BID) | ORAL | 1 refills | Status: DC

## 2016-01-17 MED ORDER — HYDROCODONE-HOMATROPINE 5-1.5 MG/5ML PO SYRP: 5.0000 mL | ORAL_SOLUTION | Freq: Two times a day (BID) | ORAL | 0 refills | Status: DC | PRN

## 2016-01-17 MED ORDER — SUMATRIPTAN 20 MG/ACT NA SOLN: 20.0000 mg | NASAL | 3 refills | Status: DC | PRN

## 2016-01-17 MED ORDER — FLUTICASONE PROPIONATE 50 MCG/ACT NA SUSP: 2.0000 | Freq: Every day | NASAL | 12 refills | Status: DC

## 2016-01-17 MED ORDER — SUMATRIPTAN SUCCINATE 50 MG PO TABS: 50.0000 mg | ORAL_TABLET | Freq: Once | ORAL | 3 refills | Status: DC

## 2016-01-17 NOTE — Patient Instructions (Addendum)
Please be sure to drink plenty of clear liquids and get plenty of rest.  Try netti pot for nasal rinse. Please come back if you are not better in 5-7 days   Thank you for coming in today. I hope you feel we met your needs.  Feel free to call [facility] if you have any questions or further requests.  Please consider signing up for MyChart if you do not already have it, as this is a great way to communicate with me.  Best,  Whitney McVey, PA-C   IF you received an x-ray today, you will receive an invoice from [facility]. Please contact [facility] at [phone] with questions or concerns regarding your invoice.   IF you received labwork today, you will receive an invoice from [employer]. Please contact [facility] at [phone] with questions or concerns regarding your invoice.   Our billing staff will not be able to assist you with questions regarding bills from these companies.  You will be contacted with the lab results as soon as they are available. The fastest way to get your results is to activate your My Chart account. Instructions are located on the last page of this paperwork. If you have not heard from [place] regarding the results in 2 weeks, please contact this office.

## 2016-01-17 NOTE — Progress Notes (Signed)
Monique Shaw  MRN: [id number] DOB: Jan 16, 1981  PCP: Harvie HeckJACKSON,MISHI, MD  Subjective:  Pt is a 35 year old female who presents to clinic for illness x five days.  Sneezing, congestion and itchy throat, productive cough, no energy, wants to sleep all the time. Temp 101.6 two days ago. Is not drinking plenty of fluids, has not taken anything to feel better. Ibuprofen for headaches, helps some.  Denies chills, nausea, vomiting, chest pain, SOB.  She is a Runner, broadcasting/film/videoteacher, notes many of her children have recently gone home from school due to illness. Has used ibuprofen for headaches, helped some.   History of migraines - Controlled with Imitrex nasal spray and tablets. She is out of both.   Review of Systems  Constitutional: Positive for activity change and fever. Negative for chills.  HENT: Positive for congestion, rhinorrhea, sinus pressure, sneezing and sore throat.   Respiratory: Positive for cough. Negative for chest tightness and shortness of breath.   Cardiovascular: Negative for chest pain and palpitations.  Gastrointestinal: Negative for diarrhea, nausea and vomiting.    Patient Active Problem List   Diagnosis Date Noted  . Obstructive sleep apnea (adult) (pediatric) 08/13/2012  . Anxiety state, unspecified 07/14/2012  . Depression 07/14/2012  . Other and unspecified hyperlipidemia 07/14/2012  . Headache(784.0) 07/14/2012  . Allergic rhinitis 07/14/2012  . Unspecified vitamin D deficiency 07/14/2012  . Family history of colon cancer 07/14/2012    Current Outpatient Prescriptions on File Prior to Visit  Medication Sig Dispense Refill  . ALPRAZolam (XANAX) 0.25 MG tablet Take 0.25 mg by mouth at bedtime as needed for sleep. Reported on 03/03/2015    . amphetamine-dextroamphetamine (ADDERALL) 30 MG tablet Take 30 mg by mouth 2 (two) times daily.    . ergocalciferol (VITAMIN D2) 50000 UNITS capsule Take 50,000 Units by mouth once a week.    Marland Kitchen. levonorgestrel-ethinyl  estradiol (AVIANE,ALESSE,LESSINA) 0.1-20 MG-MCG tablet Take 1 tablet by mouth daily.    . ondansetron (ZOFRAN-ODT) 4 MG disintegrating tablet Take 1 tablet (4 mg total) by mouth every 8 (eight) hours as needed for nausea or vomiting. 30 tablet 3  . promethazine (PHENERGAN) 25 MG tablet Take 1 tablet (25 mg total) by mouth every 8 (eight) hours as needed for nausea. 30 tablet 1  . propranolol (INDERAL) 40 MG tablet TAKE 1 TABLET (40 MG TOTAL) BY MOUTH 2 TIMES DAILY.  2  . SUMAtriptan (IMITREX) 20 MG/ACT nasal spray Place 1 spray (20 mg total) into the nose every 2 (two) hours as needed for migraine or headache. May repeat in 2 hours if headache persists or recurs. 1 Inhaler 3  . SUMAtriptan (IMITREX) 50 MG tablet Take 1 tablet (50 mg total) by mouth once. May repeat in 2 hours if needed. No more than 2 per 24 hours. No more than 3 times per week. 10 tablet 3  . topiramate (TOPAMAX) 50 MG tablet TAKE 2 TABLETS BY MOUTH IN THE MORNING AND 3 TABS BEFORE BED DAILY  2  . TRINTELLIX 5 MG TABS Take 3 tablets by mouth daily.  3   Current Facility-Administered Medications on File Prior to Visit  Medication Dose Route Frequency Provider Last Rate Last Dose  . ondansetron (ZOFRAN-ODT) disintegrating tablet 4 mg  4 mg Oral Once Thao P Le, DO        No Known Allergies  Objective:  There were no vitals taken for this visit.  Physical Exam  Constitutional: She is oriented to person, place, and time and  well-developed, well-nourished, and in no distress. No distress.  HENT:  Right Ear: Tympanic membrane normal.  Left Ear: Tympanic membrane normal.  Nose: Mucosal edema and rhinorrhea present. Right sinus exhibits frontal sinus tenderness. Left sinus exhibits no frontal sinus tenderness.  Mouth/Throat: Mucous membranes are normal. No posterior oropharyngeal edema or posterior oropharyngeal erythema.  Cardiovascular: Normal rate, regular rhythm and normal heart sounds.   Pulmonary/Chest: Effort normal and  breath sounds normal. No respiratory distress. She has no wheezes. She has no rales.  Neurological: She is alert and oriented to person, place, and time. GCS score is 15.  Skin: Skin is warm and dry.  Psychiatric: Mood, memory, affect and judgment normal.  Vitals reviewed.   Assessment and Plan :  1. Migraine without aura and without status migrainosus, not intractable - SUMAtriptan (IMITREX) 50 MG tablet; Take 1 tablet (50 mg total) by mouth once. May repeat in 2 hours if needed. No more than 2 per 24 hours. No more than 3 times per week.  Dispense: 10 tablet; Refill: 3 - SUMAtriptan (IMITREX) 20 MG/ACT nasal spray; Place 1 spray (20 mg total) into the nose every 2 (two) hours as needed for migraine or headache. May repeat in 2 hours if headache persists or recurs.  Dispense: 1 Inhaler; Refill: 3  2. Rhinosinusitis - Dextromethorphan-Guaifenesin (MUCINEX DM MAXIMUM STRENGTH) 60-1200 MG TB12; Take 1 tablet by mouth every 12 (twelve) hours.  Dispense: 14 each; Refill: 1 - HYDROcodone-homatropine (HYCODAN) 5-1.5 MG/5ML syrup; Take 5 mLs by mouth every 12 (twelve) hours as needed for cough.  Dispense: 100 mL; Refill: 0 - fluticasone (FLONASE) 50 MCG/ACT nasal spray; Place 2 sprays into both nostrils daily.  Dispense: 16 g; Refill: 12 - Supportive care encouraged: Drink plenty of fluids and rest. RTC in 5-7 days if not better.   Marco CollieWhitney Marzelle Rutten, PA-C  [facility] 01/17/2016 10:22 AM

## 2016-05-17 ENCOUNTER — Encounter (HOSPITAL_BASED_OUTPATIENT_CLINIC_OR_DEPARTMENT_OTHER): Payer: Self-pay

## 2016-05-17 ENCOUNTER — Emergency Department (HOSPITAL_BASED_OUTPATIENT_CLINIC_OR_DEPARTMENT_OTHER): Payer: Worker's Compensation

## 2016-05-17 ENCOUNTER — Emergency Department (HOSPITAL_BASED_OUTPATIENT_CLINIC_OR_DEPARTMENT_OTHER)
Admission: EM | Admit: 2016-05-17 | Discharge: 2016-05-18 | Disposition: A | Discharge status: Home / Self Care | Payer: Worker's Compensation | Attending: Emergency Medicine | Admitting: Emergency Medicine

## 2016-05-17 DIAGNOSIS — Y929 Unspecified place or not applicable: Secondary | ICD-10-CM | POA: Insufficient documentation

## 2016-05-17 DIAGNOSIS — Y990 Civilian activity done for income or pay: Secondary | ICD-10-CM | POA: Insufficient documentation

## 2016-05-17 DIAGNOSIS — S8992XA Unspecified injury of left lower leg, initial encounter: Secondary | ICD-10-CM | POA: Diagnosis present

## 2016-05-17 DIAGNOSIS — I10 Essential (primary) hypertension: Secondary | ICD-10-CM | POA: Insufficient documentation

## 2016-05-17 DIAGNOSIS — J45909 Unspecified asthma, uncomplicated: Secondary | ICD-10-CM | POA: Diagnosis not present

## 2016-05-17 DIAGNOSIS — M79641 Pain in right hand: Secondary | ICD-10-CM | POA: Insufficient documentation

## 2016-05-17 DIAGNOSIS — W010XXA Fall on same level from slipping, tripping and stumbling without subsequent striking against object, initial encounter: Secondary | ICD-10-CM | POA: Insufficient documentation

## 2016-05-17 DIAGNOSIS — Y939 Activity, unspecified: Secondary | ICD-10-CM | POA: Diagnosis not present

## 2016-05-17 DIAGNOSIS — M25562 Pain in left knee: Secondary | ICD-10-CM

## 2016-05-17 DIAGNOSIS — Z79899 Other long term (current) drug therapy: Secondary | ICD-10-CM | POA: Insufficient documentation

## 2016-05-17 DIAGNOSIS — Z23 Encounter for immunization: Secondary | ICD-10-CM | POA: Insufficient documentation

## 2016-05-17 DIAGNOSIS — S81012A Laceration without foreign body, left knee, initial encounter: Secondary | ICD-10-CM | POA: Insufficient documentation

## 2016-05-17 MED ORDER — ACETAMINOPHEN 325 MG PO TABS
650.0000 mg | ORAL_TABLET | Freq: Once | ORAL | Status: AC
  Administered 2016-05-17: 650 mg via ORAL
  Filled 2016-05-17: qty 2

## 2016-05-17 MED ORDER — TETANUS-DIPHTH-ACELL PERTUSSIS 5-2.5-18.5 LF-MCG/0.5 IM SUSP
0.5000 mL | Freq: Once | INTRAMUSCULAR | Status: AC
  Administered 2016-05-18: 0.5 mL via INTRAMUSCULAR
  Filled 2016-05-17: qty 0.5

## 2016-05-17 NOTE — ED Triage Notes (Signed)
Pt fell yesterday morning while at work onto her left knee.  She is c/o left knee pain, able to ambulate, and right ring finger pain.  She is in a walking boot on left leg for tendonitis.

## 2016-05-17 NOTE — ED Provider Notes (Signed)
[facility] Provider Note   [name]: [id number] Arrival date & time: 05/17/16  1959  By signing my name below, I, Modena Jansky, attest that this documentation has been prepared under the direction and in the presence of non-physician practitioner, Azucena Kuba, PA-C. Electronically Signed: Modena Jansky, Scribe. 05/17/2016. 11:10 PM.  History   Chief Complaint Chief Complaint  Patient presents with  . Fall   The history is provided by the patient. No language interpreter was used.    HPI Comments: Monique Shaw Duke Salvia is a 36 y.o. female who presents to the Emergency Department complaining of a fall that occurred last night. She states she tripped, fell, and landed on her left knee at work. She denies any LOC or head injury. She has been icing affected areas with relief. She reports associated left knee pain and right 4th finger pain. She describes her pain as constant, moderate, and exacerbated by movement. She is unsure of tetanus status. She currently has a walking boot on her LLE for tendonitis or possible stress frature. She denies any other complaintsIncluding numbness, weakness, decreased range of motion.    PCP: Harvie Heck, MD  Past Medical History:  Diagnosis Date  . Allergy   . Anxiety   . ASCVD (arteriosclerotic cardiovascular disease) 2011  . Asthma   . Blood type, Rh negative   . Depression   . GERD (gastroesophageal reflux disease)   . H/O migraine 10/25/10  . H/O toxoplasmosis   . H/O varicella    pt denies   . History of elevated lipids 2011  . Hx: UTI (urinary tract infection) 2007  . Hypertension   . IBS (irritable bowel syndrome)   . Irregular menstrual cycle 2004  . Migraine   . Obese   . Sleep apnea   . Weight gain 2004    Patient Active Problem List   Diagnosis Date Noted  . Obstructive sleep apnea (adult) (pediatric) 08/13/2012  . Anxiety state, unspecified 07/14/2012  . Depression 07/14/2012  . Other and unspecified  hyperlipidemia 07/14/2012  . Headache(784.0) 07/14/2012  . Allergic rhinitis 07/14/2012  . Unspecified vitamin D deficiency 07/14/2012  . Family history of colon cancer 07/14/2012    Past Surgical History:  Procedure Laterality Date  . gastroenteritis    . WISDOM TOOTH EXTRACTION      OB History    Gravida Para Term Preterm AB Living   1 1 1     1    SAB TAB Ectopic Multiple Live Births           1       Home Medications    Prior to Admission medications   Medication Sig Start Date End Date Taking? Authorizing Provider  ALPRAZolam (XANAX) 0.25 MG tablet Take 0.5 mg by mouth 4 (four) times daily as needed for sleep. Reported on 03/03/2015   Yes Historical Provider, MD  amphetamine-dextroamphetamine (ADDERALL) 30 MG tablet Take 30 mg by mouth 2 (two) times daily.   Yes Historical Provider, MD  atorvastatin (LIPITOR) 20 MG tablet Take 20 mg by mouth daily.   Yes Historical Provider, MD  ergocalciferol (VITAMIN D2) 50000 UNITS capsule Take 50,000 Units by mouth 2 (two) times a week.    Yes Historical Provider, MD  GABAPENTIN PO Take 3 tablets by mouth.   Yes Historical Provider, MD  levonorgestrel-ethinyl estradiol (AVIANE,ALESSE,LESSINA) 0.1-20 MG-MCG tablet Take 1 tablet by mouth daily.   Yes Historical Provider, MD  ondansetron (ZOFRAN-ODT) 4 MG disintegrating tablet Take 1 tablet (4  mg total) by mouth every 8 (eight) hours as needed for nausea or vomiting. 03/09/14  Yes Huston Foley, MD  SUMAtriptan (IMITREX) 20 MG/ACT nasal spray Place 1 spray (20 mg total) into the nose every 2 (two) hours as needed for migraine or headache. May repeat in 2 hours if headache persists or recurs. 01/17/16  Yes Elizabeth Whitney McVey, PA-C  SUMAtriptan (IMITREX) 50 MG tablet Take 1 tablet (50 mg total) by mouth once. May repeat in 2 hours if needed. No more than 2 per 24 hours. No more than 3 times per week. 01/17/16 05/17/16 Yes Elizabeth Whitney McVey, PA-C  TRAZODONE HCL PO Take 3 tablets by mouth.    Yes Historical Provider, MD  TRINTELLIX 5 MG TABS Take 3 tablets by mouth daily. 02/25/15  Yes Historical Provider, MD  zolpidem (AMBIEN) 10 MG tablet Take 10 mg by mouth at bedtime as needed for sleep.   Yes Historical Provider, MD  Dextromethorphan-Guaifenesin (MUCINEX DM MAXIMUM STRENGTH) 60-1200 MG TB12 Take 1 tablet by mouth every 12 (twelve) hours. Patient not taking: Reported on 05/17/2016 01/17/16   Madelaine Bhat McVey, PA-C  fluticasone [facility]) 50 MCG/ACT nasal spray Place 2 sprays into both nostrils daily. Patient not taking: Reported on 05/17/2016 01/17/16   [facility] McVey, PA-C  HYDROcodone-homatropine [facility]) 5-1.5 MG/5ML syrup Take 5 mLs by mouth every 12 (twelve) hours as needed for cough. Patient not taking: Reported on 05/17/2016 01/17/16   Madelaine Bhat McVey, PA-C  promethazine (PHENERGAN) 25 MG tablet Take 1 tablet (25 mg total) by mouth every 8 (eight) hours as needed for nausea. Patient not taking: Reported on 05/17/2016 09/10/13   Huston Foley, MD    Family History Family History  Problem Relation Age of Onset  . Heart disease Mother   . Hypertension Mother   . Cancer Mother   . Hypertension Father   . Anemia Father   . Hypertension Maternal Grandmother   . Heart disease Paternal Grandfather     MI  . Cancer Maternal Uncle   . Cancer Paternal Aunt   . Alcohol abuse Paternal Uncle     Social History Social History  Substance Use Topics  . Smoking status: Never Smoker  . Smokeless tobacco: Never Used  . Alcohol use No     Allergies   Patient has no known allergies.   Review of Systems Review of Systems  Eyes: Negative for visual disturbance.  Musculoskeletal: Positive for arthralgias, gait problem and myalgias (Left knee, right 4th finger).  Skin: Positive for wound.  Neurological: Negative for dizziness, syncope, weakness, light-headedness, numbness and headaches.  All other systems reviewed and are negative.    Physical  Exam Updated Vital Signs BP 142/76 (BP Location: Left Arm)   Pulse 77   Temp 98.5 F (36.9 C) (Oral)   Resp 18   Ht 5\' 9"  (1.753 m)   Wt 278 lb (126.1 kg)   LMP 05/08/2016   SpO2 100%   BMI 41.05 kg/m   Physical Exam  Constitutional: She appears well-developed and well-nourished. No distress.  HENT:  Head: Normocephalic and atraumatic.  Eyes: Right eye exhibits no discharge. Left eye exhibits no discharge. No scleral icterus.  Cardiovascular: Intact distal pulses.   Pulmonary/Chest: No respiratory distress.  Musculoskeletal: Normal range of motion.       Left knee: She exhibits ecchymosis, laceration (abrasions) and bony tenderness. She exhibits normal range of motion, no swelling, no effusion, no erythema, normal alignment, no LCL laxity, normal patellar mobility, normal  meniscus and no MCL laxity. Tenderness found. Medial joint line, lateral joint line and patellar tendon tenderness noted.       Right hand: She exhibits tenderness (PIP of 4th digit) and bony tenderness. She exhibits normal range of motion, normal capillary refill, no deformity and no laceration. Normal sensation noted. Normal strength noted.  No joint laxity noted. Negative anterior drawer test. No pain with valgus or varus stress. DP pulses are 2+ bilaterally. Sensation intact. Strength 5 out of 5 in lower extremities. Cap refill normal. Patient able to ambulate.    Palpation of the left ankle or left hip. Full range of motion.  Neurological: She is alert.  Skin: Skin is warm and dry. Capillary refill takes less than 2 seconds. No pallor.  Nursing note and vitals reviewed.    ED Treatments / Results  DIAGNOSTIC STUDIES: Oxygen Saturation is 100% on RA, normal by my interpretation.    COORDINATION OF CARE: 11:14 PM- Pt advised of plan for treatment and pt agrees.  Labs (all labs ordered are listed, but only abnormal results are displayed) Labs Reviewed - No data to display  EKG  EKG  Interpretation None       Radiology Dg Knee Complete 4 Views Left  Result Date: 05/17/2016 CLINICAL DATA:  Left knee pain following fall 2 days ago, initial encounter EXAM: LEFT KNEE - COMPLETE 4+ VIEW COMPARISON:  None. FINDINGS: Mild medial joint space narrowing is noted with osteophytic change. No acute fracture or dislocation is noted. No joint effusion is seen. IMPRESSION: Medial joint space narrowing without acute abnormality. Electronically Signed   By: Alcide CleverMark  Lukens M.D.   On: 05/17/2016 21:37   Dg Finger Ring Right  Result Date: 05/17/2016 CLINICAL DATA:  Fall yesterday with right finger pain, initial encounter EXAM: RIGHT RING FINGER 2+V COMPARISON:  None. FINDINGS: There is no evidence of fracture or dislocation. There is no evidence of arthropathy or other focal bone abnormality. Soft tissues are unremarkable. IMPRESSION: No acute abnormality noted. Electronically Signed   By: Alcide CleverMark  Lukens M.D.   On: 05/17/2016 21:38    Procedures Procedures (including critical care time)  Medications Ordered in ED Medications  acetaminophen (TYLENOL) tablet 650 mg (650 mg Oral Given 05/17/16 2331)  Tdap (BOOSTRIX) injection 0.5 mL (0.5 mLs Intramuscular Given 05/18/16 0013)     Initial Impression / Assessment and Plan / ED Course  I have reviewed the triage vital signs and the nursing notes.  Pertinent labs & imaging results that were available during my care of the patient were reviewed by me and considered in my medical decision making (see chart for details).     Patient presents with mechanical fall with left knee pain and right hand pain. Patient X-Ray negative for obvious fracture or dislocation. Patient is neurovascularly intact. Full range of motion. Currently in a walking boot for stress fracture. Pain managed in ED. Pt advised to follow up with orthopedics if symptoms persist for possibility of missed fracture diagnosis. Patient doesn't abrasions. Unsure of last tetanus. T have given  in the ED. Patient is hemodynamically stable. Workman's comp papers were given. Patient given brace while in ED, conservative therapy recommended and discussed. Patient will be dc home & is agreeable with above plan. Encourage rice therapy at home.  Final Clinical Impressions(s) / ED Diagnoses   Final diagnoses:  Acute pain of left knee  Right hand pain    New Prescriptions Discharge Medication List as of 05/18/2016 12:00 AM  I personally performed the services described in this documentation, which was scribed in my presence. The recorded information has been reviewed and is accurate.    Rise Mu, PA-C 05/18/16 0032    Canary Brim Tegeler, MD 05/18/16 [phone]

## 2016-05-18 NOTE — Discharge Instructions (Signed)
Your x-ray showed no signs of fracture. Likely a sprain. Please wear the knee immobilizer for comfort. Use crutches for weightbearing as tolerated. Tylenol for pain. Ice, rest, elevate the left leg. Follow-up with her orthopedist his symptoms not improve.

## 2016-08-09 ENCOUNTER — Encounter: Payer: Self-pay | Admitting: Emergency Medicine

## 2016-08-09 ENCOUNTER — Ambulatory Visit (INDEPENDENT_AMBULATORY_CARE_PROVIDER_SITE_OTHER): Payer: BC Managed Care – PPO | Admitting: Emergency Medicine

## 2016-08-09 VITALS — BP 113/75 | HR 75 | Temp 98.5°F | Resp 18 | Ht 69.0 in | Wt 283.0 lb

## 2016-08-09 DIAGNOSIS — R091 Pleurisy: Secondary | ICD-10-CM | POA: Insufficient documentation

## 2016-08-09 DIAGNOSIS — R05 Cough: Secondary | ICD-10-CM

## 2016-08-09 DIAGNOSIS — J209 Acute bronchitis, unspecified: Secondary | ICD-10-CM | POA: Diagnosis not present

## 2016-08-09 DIAGNOSIS — R059 Cough, unspecified: Secondary | ICD-10-CM

## 2016-08-09 MED ORDER — PREDNISONE 20 MG PO TABS: 40.0000 mg | ORAL_TABLET | Freq: Every day | ORAL | 0 refills | Status: AC

## 2016-08-09 MED ORDER — AZITHROMYCIN 250 MG PO TABS: ORAL_TABLET | ORAL | 0 refills | Status: DC

## 2016-08-09 NOTE — Patient Instructions (Addendum)
     IF you received an x-ray today, you will receive an invoice from [facility]. Please contact [facility] at [phone] with questions or concerns regarding your invoice.   IF you received labwork today, you will receive an invoice from [facility]. Please contact [facility] at [phone] with questions or concerns regarding your invoice.   Our billing staff will not be able to assist you with questions regarding bills from these companies.  You will be contacted with the lab results as soon as they are available. The fastest way to get your results is to activate your My Chart account. Instructions are located on the last page of this paperwork. If you have not heard from us regarding the results in 2 weeks, please contact this office.      Pleurisy Pleurisy is irritation and swelling (inflammation) of the linings of your lungs (pleura). This can cause pain in your chest, back, or shoulder. It can also cause trouble breathing. Follow these instructions at home: Medicines  Take over-the-counter and prescription medicines only as told by your doctor.  If you were prescribed antibiotic medicine, take it as told by your doctor. Do not stop taking the antibiotic even if you start to feel better. Activity  Rest and return to your normal activities as told by your doctor. Ask your doctor what activities are safe for you.  Do not drive or use heavy machinery while taking prescription pain medicine. General instructions  Watch for any changes in your condition.  Take deep breaths often, even if it is painful. This can help prevent lung problems.  When lying down, lie on your painful side. This may help you feel less pain.  Do not smoke. If you need help quitting, ask your doctor.  Keep all follow-up visits as told by your doctor. This is important. Contact a doctor if:  You have pain that:  Gets worse.  Does not get better with medicine.  Lasts for more than  1 week.  You have a fever or chills.  You have a cough that does not get better at home.  You have trouble breathing that does not get better at home.  You cough up liquid that looks like pus (purulent secretions). Get help right away if:  Your lips, fingernails, or toenails turn dark or turn blue.  You cough up blood.  You have trouble breathing that gets worse.  You are making loud noises when you breathe (wheezing) and this gets worse.  You have pain that spreads to your neck, arms, or jaw.  You get a rash.  You throw up (vomit).  You pass out (faint). Summary  Pleurisy is irritation and swelling (inflammation) of the linings of your lungs (pleura).  Pleurisy can cause pain and trouble breathing.  If you have a cough that does not get better at home, contact your doctor.  Get help right away if you are having trouble breathing and it is getting worse. This information is not intended to replace advice given to you by your health care provider. Make sure you discuss any questions you have with your health care provider. Document Released: 02/16/2008 Document Revised: 11/28/2015 Document Reviewed: 11/28/2015 Elsevier Interactive Patient Education  2017 [employer]  

## 2016-08-09 NOTE — Progress Notes (Signed)
Monique Shaw [place] 36 y.o.   Chief Complaint  Patient presents with  . Cough  . Chest Pain    With cough  . Fatigue  . Nasal Congestion    HISTORY OF PRESENT ILLNESS: This is a 36 y.o. female complaining of URI symptoms x 4-5 days but today woke up with pleuritic chest pain.  URI   This is a new problem. The current episode started in the past 7 days. The problem has been gradually worsening. There has been no fever. Associated symptoms include chest pain (pleuritic), congestion, coughing, headaches and a sore throat (itchy). Pertinent negatives include no abdominal pain, ear pain, joint pain, joint swelling, nausea, neck pain, rash, rhinorrhea, sinus pain, swollen glands, vomiting or wheezing.     Prior to Admission medications   Medication Sig Start Date End Date Taking? Authorizing Provider  ALPRAZolam (XANAX) 0.25 MG tablet Take 0.5 mg by mouth 4 (four) times daily as needed for sleep. Reported on 03/03/2015   Yes [provider]  amphetamine-dextroamphetamine (ADDERALL) 30 MG tablet Take 30 mg by mouth 2 (two) times daily.   Yes [provider]  atorvastatin (LIPITOR) 20 MG tablet Take 20 mg by mouth daily.   Yes [provider]  desvenlafaxine (PRISTIQ) 100 MG 24 hr tablet Take 100 mg by mouth daily.   Yes [provider]  ergocalciferol (VITAMIN D2) 50000 UNITS capsule Take 50,000 Units by mouth 2 (two) times a week.    Yes [provider]  fluticasone (FLONASE) 50 MCG/ACT nasal spray Place 2 sprays into both nostrils daily. 01/17/16  Yes McVey, Madelaine Bhat, PA-C  GABAPENTIN PO Take 3 tablets by mouth.   Yes [provider]  levonorgestrel-ethinyl estradiol (AVIANE,ALESSE,LESSINA) 0.1-20 MG-MCG tablet Take 1 tablet by mouth daily.   Yes [provider]  ondansetron (ZOFRAN-ODT) 4 MG disintegrating tablet Take 1 tablet (4 mg total) by mouth every 8 (eight) hours as needed for nausea or vomiting. 03/09/14   Yes Huston Foley, MD  SUMAtriptan (IMITREX) 20 MG/ACT nasal spray Place 1 spray (20 mg total) into the nose every 2 (two) hours as needed for migraine or headache. May repeat in 2 hours if headache persists or recurs. 01/17/16  Yes McVey, Madelaine Bhat, PA-C  TRAZODONE HCL PO Take 3 tablets by mouth.   Yes [provider]  zolpidem (AMBIEN) 10 MG tablet Take 10 mg by mouth at bedtime as needed for sleep.   Yes [provider]  HYDROcodone-homatropine (HYCODAN) 5-1.5 MG/5ML syrup Take 5 mLs by mouth every 12 (twelve) hours as needed for cough. Patient not taking: Reported on 08/09/2016 01/17/16   McVey, Madelaine Bhat, PA-C  promethazine (PHENERGAN) 25 MG tablet Take 1 tablet (25 mg total) by mouth every 8 (eight) hours as needed for nausea. Patient not taking: Reported on 05/17/2016 09/10/13   Huston Foley, MD  SUMAtriptan (IMITREX) 50 MG tablet Take 1 tablet (50 mg total) by mouth once. May repeat in 2 hours if needed. No more than 2 per 24 hours. No more than 3 times per week. 01/17/16 05/17/16  McVey, Madelaine Bhat, PA-C    No Known Allergies  Patient Active Problem List   Diagnosis Date Noted  . Obstructive sleep apnea (adult) (pediatric) 08/13/2012  . Anxiety state, unspecified 07/14/2012  . Depression 07/14/2012  . Other and unspecified hyperlipidemia 07/14/2012  . Headache(784.0) 07/14/2012  . Allergic rhinitis 07/14/2012  . Unspecified vitamin D deficiency 07/14/2012  . Family history of colon cancer 07/14/2012  Past Medical History:  Diagnosis Date  . Allergy   . Anxiety   . ASCVD (arteriosclerotic cardiovascular disease) 2011  . Asthma   . Blood type, Rh negative   . Depression   . GERD (gastroesophageal reflux disease)   . H/O migraine 10/25/10  . H/O toxoplasmosis   . H/O varicella    pt denies   . History of elevated lipids 2011  . Hx: UTI (urinary tract infection) 2007  . Hypertension   . IBS (irritable bowel syndrome)   . Irregular  menstrual cycle 2004  . Migraine   . Obese   . Sleep apnea   . Weight gain 2004    Past Surgical History:  Procedure Laterality Date  . gastroenteritis    . WISDOM TOOTH EXTRACTION      Social History   Social History  . Marital status: Married    Spouse name: Molly Maduro  . Number of children: 1  . Years of education: M. ED   Occupational History  .  [employer]    Teacher   Social History Main Topics  . Smoking status: Never Smoker  . Smokeless tobacco: Never Used  . Alcohol use No  . Drug use: No  . Sexual activity: Yes    Birth control/ protection: Pill     Comment: ALESSE   Other Topics Concern  . Not on file   Social History Narrative   Pt lives at home with husband Molly Maduro) and son   Pt is right handed    Education- Masters   Caffeine- occasional soda or tea but not every day    Family History  Problem Relation Age of Onset  . Heart disease Mother   . Hypertension Mother   . Cancer Mother   . Hypertension Father   . Anemia Father   . Hypertension Maternal Grandmother   . Heart disease Paternal Grandfather        MI  . Cancer Maternal Uncle   . Cancer Paternal Aunt   . Alcohol abuse Paternal Uncle      Review of Systems  Constitutional: Positive for malaise/fatigue.  HENT: Positive for congestion and sore throat (itchy). Negative for ear pain, rhinorrhea and sinus pain.   Respiratory: Positive for cough. Negative for wheezing.   Cardiovascular: Positive for chest pain (pleuritic).  Gastrointestinal: Negative for abdominal pain, nausea and vomiting.  Musculoskeletal: Negative for joint pain and neck pain.  Skin: Negative for rash.  Neurological: Positive for headaches.  All other systems reviewed and are negative.   Vitals:   08/09/16 0819  BP: 113/75  Pulse: 75  Resp: 18  Temp: 98.5 F (36.9 C)    Physical Exam  Constitutional: She is oriented to person, place, and time. She appears well-developed and  well-nourished.  HENT:  Head: Normocephalic and atraumatic.  Nose: Nose normal.  Mouth/Throat: Oropharynx is clear and moist. No oropharyngeal exudate.  Eyes: Conjunctivae and EOM are normal. Pupils are equal, round, and reactive to light.  Neck: Normal range of motion. Neck supple. No JVD present. No thyromegaly present.  Cardiovascular: Normal rate, regular rhythm and normal heart sounds.   Pulmonary/Chest: Effort normal and breath sounds normal.  Abdominal: Soft. Bowel sounds are normal.  Musculoskeletal: Normal range of motion.  Lymphadenopathy:    She has no cervical adenopathy.  Neurological: She is alert and oriented to person, place, and time. No sensory deficit. She exhibits normal muscle tone.  Skin: Skin is warm and dry. Capillary  refill takes less than 2 seconds. No rash noted.  Psychiatric: She has a normal mood and affect. Her behavior is normal.  Vitals reviewed.    ASSESSMENT & PLAN: Nettie Elmvie was seen today for cough, chest pain, fatigue and nasal congestion.  Diagnoses and all orders for this visit:  Pleurisy  Acute bronchitis, unspecified organism  Cough  Other orders -     azithromycin (ZITHROMAX) 250 MG tablet; Sig as indicated -     predniSONE (DELTASONE) 20 MG tablet; Take 2 tablets (40 mg total) by mouth daily with breakfast.    Patient Instructions       IF you received an x-ray today, you will receive an invoice from [facility]. Please contact [facility] at [phone] with questions or concerns regarding your invoice.   IF you received labwork today, you will receive an invoice from [facility]. Please contact [facility] at [phone] with questions or concerns regarding your invoice.   Our billing staff will not be able to assist you with questions regarding bills from these companies.  You will be contacted with the lab results as soon as they are available. The fastest way to get your results is to activate your My Chart  account. Instructions are located on the last page of this paperwork. If you have not heard from us regarding the results in 2 weeks, please contact this office.     Pleurisy Pleurisy is irritation and swelling (inflammation) of the linings of your lungs (pleura). This can cause pain in your chest, back, or shoulder. It can also cause trouble breathing. Follow these instructions at home: Medicines   Take over-the-counter and prescription medicines only as told by your doctor.  If you were prescribed antibiotic medicine, take it as told by your doctor. Do not stop taking the antibiotic even if you start to feel better. Activity   Rest and return to your normal activities as told by your doctor. Ask your doctor what activities are safe for you.  Do not drive or use heavy machinery while taking prescription pain medicine. General instructions   Watch for any changes in your condition.  Take deep breaths often, even if it is painful. This can help prevent lung problems.  When lying down, lie on your painful side. This may help you feel less pain.  Do not smoke. If you need help quitting, ask your doctor.  Keep all follow-up visits as told by your doctor. This is important. Contact a doctor if:  You have pain that:  Gets worse.  Does not get better with medicine.  Lasts for more than 1 week.  You have a fever or chills.  You have a cough that does not get better at home.  You have trouble breathing that does not get better at home.  You cough up liquid that looks like pus (purulent secretions). Get help right away if:  Your lips, fingernails, or toenails turn dark or turn blue.  You cough up blood.  You have trouble breathing that gets worse.  You are making loud noises when you breathe (wheezing) and this gets worse.  You have pain that spreads to your neck, arms, or jaw.  You get a rash.  You throw up (vomit).  You pass out (faint). Summary  Pleurisy is  irritation and swelling (inflammation) of the linings of your lungs (pleura).  Pleurisy can cause pain and trouble breathing.  If you have a cough that does not get better at home, contact your doctor.  Get help right away if you are having trouble breathing and it is getting worse. This information is not intended to replace advice given to you by your health care provider. Make sure you discuss any questions you have with your health care provider. Document Released: 02/16/2008 Document Revised: 11/28/2015 Document Reviewed: 11/28/2015 Elsevier Interactive Patient Education  2017 Elsevier Inc.      Edwina Barth, MD [facility]

## 2017-02-22 ENCOUNTER — Ambulatory Visit: Payer: BC Managed Care – PPO | Admitting: Physician Assistant

## 2017-02-27 ENCOUNTER — Encounter (HOSPITAL_BASED_OUTPATIENT_CLINIC_OR_DEPARTMENT_OTHER): Payer: Self-pay | Admitting: *Deleted

## 2017-02-27 ENCOUNTER — Emergency Department (HOSPITAL_BASED_OUTPATIENT_CLINIC_OR_DEPARTMENT_OTHER)
Admission: EM | Admit: 2017-02-27 | Discharge: 2017-02-27 | Disposition: A | Discharge status: Home / Self Care | Payer: BC Managed Care – PPO | Attending: Emergency Medicine | Admitting: Emergency Medicine

## 2017-02-27 ENCOUNTER — Other Ambulatory Visit: Payer: Self-pay

## 2017-02-27 DIAGNOSIS — S39012A Strain of muscle, fascia and tendon of lower back, initial encounter: Secondary | ICD-10-CM | POA: Diagnosis not present

## 2017-02-27 DIAGNOSIS — I10 Essential (primary) hypertension: Secondary | ICD-10-CM | POA: Diagnosis not present

## 2017-02-27 DIAGNOSIS — Y998 Other external cause status: Secondary | ICD-10-CM | POA: Diagnosis not present

## 2017-02-27 DIAGNOSIS — M7918 Myalgia, other site: Secondary | ICD-10-CM | POA: Insufficient documentation

## 2017-02-27 DIAGNOSIS — Y92410 Unspecified street and highway as the place of occurrence of the external cause: Secondary | ICD-10-CM | POA: Insufficient documentation

## 2017-02-27 DIAGNOSIS — J45909 Unspecified asthma, uncomplicated: Secondary | ICD-10-CM | POA: Insufficient documentation

## 2017-02-27 DIAGNOSIS — S3992XA Unspecified injury of lower back, initial encounter: Secondary | ICD-10-CM | POA: Diagnosis present

## 2017-02-27 DIAGNOSIS — Z79899 Other long term (current) drug therapy: Secondary | ICD-10-CM | POA: Diagnosis not present

## 2017-02-27 DIAGNOSIS — Y9389 Activity, other specified: Secondary | ICD-10-CM | POA: Insufficient documentation

## 2017-02-27 MED ORDER — METHOCARBAMOL 500 MG PO TABS: 500.0000 mg | ORAL_TABLET | Freq: Two times a day (BID) | ORAL | 0 refills | Status: DC

## 2017-02-27 MED ORDER — METHOCARBAMOL 500 MG PO TABS
750.0000 mg | ORAL_TABLET | Freq: Once | ORAL | Status: AC
  Administered 2017-02-27: 750 mg via ORAL
  Filled 2017-02-27: qty 2

## 2017-02-27 NOTE — ED Notes (Signed)
ED Provider at bedside. 
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
## 2017-03-04 ENCOUNTER — Ambulatory Visit: Payer: BC Managed Care – PPO | Admitting: Family Medicine

## 2017-03-09 ENCOUNTER — Other Ambulatory Visit: Payer: Self-pay | Admitting: Physician Assistant

## 2017-03-09 DIAGNOSIS — G43009 Migraine without aura, not intractable, without status migrainosus: Secondary | ICD-10-CM

## 2017-04-05 ENCOUNTER — Other Ambulatory Visit: Payer: Self-pay | Admitting: Physical Medicine and Rehabilitation

## 2017-04-05 DIAGNOSIS — M5442 Lumbago with sciatica, left side: Principal | ICD-10-CM

## 2017-04-05 DIAGNOSIS — M5441 Lumbago with sciatica, right side: Secondary | ICD-10-CM

## 2017-04-06 ENCOUNTER — Ambulatory Visit
Admission: RE | Admit: 2017-04-06 | Discharge: 2017-04-06 | Disposition: A | Discharge status: Home / Self Care | Payer: BC Managed Care – PPO | Source: Ambulatory Visit | Attending: Physical Medicine and Rehabilitation | Admitting: Physical Medicine and Rehabilitation

## 2017-04-06 DIAGNOSIS — M5442 Lumbago with sciatica, left side: Principal | ICD-10-CM

## 2017-04-06 DIAGNOSIS — M5441 Lumbago with sciatica, right side: Secondary | ICD-10-CM

## 2019-01-13 ENCOUNTER — Other Ambulatory Visit: Payer: Self-pay | Admitting: Physician Assistant

## 2019-01-13 ENCOUNTER — Ambulatory Visit: Payer: Self-pay

## 2019-01-13 ENCOUNTER — Other Ambulatory Visit: Payer: Self-pay

## 2019-01-13 DIAGNOSIS — M25512 Pain in left shoulder: Secondary | ICD-10-CM

## 2019-01-13 DIAGNOSIS — M25531 Pain in right wrist: Secondary | ICD-10-CM

## 2019-04-21 ENCOUNTER — Ambulatory Visit: Payer: BC Managed Care – PPO | Attending: Internal Medicine

## 2019-04-21 DIAGNOSIS — Z20822 Contact with and (suspected) exposure to covid-19: Secondary | ICD-10-CM

## 2019-04-22 LAB — NOVEL CORONAVIRUS, NAA: SARS-CoV-2, NAA: NOT DETECTED

## 2020-12-18 ENCOUNTER — Other Ambulatory Visit: Payer: Self-pay

## 2020-12-18 ENCOUNTER — Inpatient Hospital Stay (HOSPITAL_COMMUNITY)
Admission: AD | Admit: 2020-12-18 | Discharge: 2020-12-18 | Disposition: A | Discharge status: Home / Self Care | Payer: BC Managed Care – PPO | Attending: Obstetrics and Gynecology | Admitting: Obstetrics and Gynecology

## 2020-12-18 ENCOUNTER — Telehealth: Payer: Self-pay

## 2020-12-18 DIAGNOSIS — Z3202 Encounter for pregnancy test, result negative: Secondary | ICD-10-CM | POA: Insufficient documentation

## 2020-12-18 DIAGNOSIS — N926 Irregular menstruation, unspecified: Secondary | ICD-10-CM | POA: Insufficient documentation

## 2020-12-18 LAB — POCT PREGNANCY, URINE: Preg Test, Ur: NEGATIVE

## 2020-12-18 LAB — HCG, QUANTITATIVE, PREGNANCY: hCG, Beta Chain, Quant, S: <1 m[IU]/mL (ref <5)

## 2020-12-18 NOTE — MAU Note (Signed)
Pt left without discharge instructions.

## 2020-12-18 NOTE — Telephone Encounter (Signed)
Called patient to review results of HCG. All questions answered and patient verbalized understanding.   Rolm Bookbinder, CNM 12/18/20 9:26 PM

## 2020-12-18 NOTE — MAU Note (Signed)
Pt reports to mau with c/o lower abd pain and late period.  Pt reports she has IUD and has never missed a period.  Reports she took a hpt today but the result was "inconclusive".  UPT neg in MAU

## 2020-12-18 NOTE — MAU Provider Note (Signed)
Event Date/Time   First Provider Initiated Contact with Patient 12/18/20 1923      S Ms. Cyerra S [place] Duke Salvia is a 40 y.o. G1P1001 patient who presents to MAU today with complaint of possible ectopic pregnancy. Patient states she uses an app to track her periods and her periods always come a few days early compared to when the app says they are supposed to arrive. However, according to her app, her period was supposed to arrive on Thursday, but patient states it has not yet come. Patient later clarifies to say she is having a small amount of bleeding at this time. Patient also states she had some sharp pelvic pain earlier today that is minimally present at this time. Patient was concerned she might be pregnant because she has a Paragard IUD and her period is never late, so she took a pregnancy test at home that was "inconclusive". When asked to clarify what she meant by inconclusive, patient states she saw a faint line on the pregnancy test she took. UPT negative in MAU. RH negative. Patient states she has not been able to reach her cervix recently to feel her IUD strings, so she is not sure if they are present at this time.  O BP 136/90 (BP Location: Right Arm)   Pulse 79   Temp 98.5 F (36.9 C) (Oral)   Resp 17   SpO2 96%   Patient Vitals for the past 24 hrs:  BP Temp Temp src Pulse Resp SpO2  12/18/20 1901 136/90 98.5 F (36.9 C) Oral 79 17 96 %   Physical Exam Vitals and nursing note reviewed.  Constitutional:      General: She is not in acute distress.    Appearance: Normal appearance. She is not ill-appearing, toxic-appearing or diaphoretic.  HENT:     Head: Normocephalic and atraumatic.  Pulmonary:     Effort: Pulmonary effort is normal.  Neurological:     Mental Status: She is alert and oriented to person, place, and time.  Psychiatric:        Mood and Affect: Mood normal.        Behavior: Behavior normal.        Thought Content: Thought content normal.         Judgment: Judgment normal.   A Medical screening exam complete UPT negative hCG ordered Patient to home while awaiting lab results  P Discharge from MAU in stable condition Patient given the option of transfer to [facility] for further evaluation or seek care in outpatient facility of choice Discussed how to check for strings at home if desires Warning signs for worsening condition that would warrant emergency follow-up discussed Patient may return to MAU as needed   Donatella Walski, Odie Sera, NP 12/18/2020 7:24 PM

## 2021-04-25 ENCOUNTER — Emergency Department (HOSPITAL_BASED_OUTPATIENT_CLINIC_OR_DEPARTMENT_OTHER): Payer: BC Managed Care – PPO

## 2021-04-25 ENCOUNTER — Encounter (HOSPITAL_BASED_OUTPATIENT_CLINIC_OR_DEPARTMENT_OTHER): Payer: Self-pay

## 2021-04-25 ENCOUNTER — Inpatient Hospital Stay (HOSPITAL_BASED_OUTPATIENT_CLINIC_OR_DEPARTMENT_OTHER)
Admission: EM | Admit: 2021-04-25 | Discharge: 2021-04-29 | DRG: 392 | Disposition: A | Discharge status: Home / Self Care | Payer: BC Managed Care – PPO | Attending: Family Medicine | Admitting: Family Medicine

## 2021-04-25 ENCOUNTER — Other Ambulatory Visit: Payer: Self-pay

## 2021-04-25 DIAGNOSIS — K219 Gastro-esophageal reflux disease without esophagitis: Secondary | ICD-10-CM | POA: Diagnosis not present

## 2021-04-25 DIAGNOSIS — F419 Anxiety disorder, unspecified: Secondary | ICD-10-CM | POA: Diagnosis not present

## 2021-04-25 DIAGNOSIS — J45909 Unspecified asthma, uncomplicated: Secondary | ICD-10-CM | POA: Diagnosis present

## 2021-04-25 DIAGNOSIS — F909 Attention-deficit hyperactivity disorder, unspecified type: Secondary | ICD-10-CM | POA: Diagnosis not present

## 2021-04-25 DIAGNOSIS — Z20822 Contact with and (suspected) exposure to covid-19: Secondary | ICD-10-CM | POA: Diagnosis present

## 2021-04-25 DIAGNOSIS — G4733 Obstructive sleep apnea (adult) (pediatric): Secondary | ICD-10-CM | POA: Diagnosis present

## 2021-04-25 DIAGNOSIS — Z3202 Encounter for pregnancy test, result negative: Secondary | ICD-10-CM | POA: Diagnosis not present

## 2021-04-25 DIAGNOSIS — E876 Hypokalemia: Secondary | ICD-10-CM | POA: Diagnosis not present

## 2021-04-25 DIAGNOSIS — K760 Fatty (change of) liver, not elsewhere classified: Secondary | ICD-10-CM | POA: Diagnosis not present

## 2021-04-25 DIAGNOSIS — Z6841 Body Mass Index (BMI) 40.0 and over, adult: Secondary | ICD-10-CM

## 2021-04-25 DIAGNOSIS — I10 Essential (primary) hypertension: Secondary | ICD-10-CM | POA: Diagnosis not present

## 2021-04-25 DIAGNOSIS — T383X5A Adverse effect of insulin and oral hypoglycemic [antidiabetic] drugs, initial encounter: Secondary | ICD-10-CM | POA: Diagnosis not present

## 2021-04-25 DIAGNOSIS — R112 Nausea with vomiting, unspecified: Principal | ICD-10-CM | POA: Diagnosis present

## 2021-04-25 DIAGNOSIS — Z8249 Family history of ischemic heart disease and other diseases of the circulatory system: Secondary | ICD-10-CM | POA: Diagnosis not present

## 2021-04-25 DIAGNOSIS — K589 Irritable bowel syndrome without diarrhea: Secondary | ICD-10-CM | POA: Diagnosis present

## 2021-04-25 DIAGNOSIS — Z79899 Other long term (current) drug therapy: Secondary | ICD-10-CM

## 2021-04-25 DIAGNOSIS — R066 Hiccough: Secondary | ICD-10-CM

## 2021-04-25 DIAGNOSIS — E785 Hyperlipidemia, unspecified: Secondary | ICD-10-CM | POA: Diagnosis not present

## 2021-04-25 DIAGNOSIS — Z809 Family history of malignant neoplasm, unspecified: Secondary | ICD-10-CM

## 2021-04-25 DIAGNOSIS — R8281 Pyuria: Secondary | ICD-10-CM

## 2021-04-25 LAB — CBC
HCT: 44.3 % (ref 36.0–46.0)
Hemoglobin: 15 g/dL (ref 12.0–15.0)
MCH: 30.1 pg (ref 26.0–34.0)
MCHC: 33.9 g/dL (ref 30.0–36.0)
MCV: 89 fL (ref 80.0–100.0)
Platelets: 249 10*3/uL (ref 150–400)
RBC: 4.98 MIL/uL (ref 3.87–5.11)
RDW: 13.3 % (ref 11.5–15.5)
WBC: 7.9 10*3/uL (ref 4.0–10.5)
nRBC: 0 % (ref 0.0–0.2)

## 2021-04-25 LAB — URINALYSIS, MICROSCOPIC (REFLEX)

## 2021-04-25 LAB — URINALYSIS, ROUTINE W REFLEX MICROSCOPIC
Glucose, UA: NEGATIVE mg/dL
Hgb urine dipstick: NEGATIVE
Ketones, ur: NEGATIVE mg/dL
Nitrite: NEGATIVE
Protein, ur: 30 mg/dL — AB
Specific Gravity, Urine: 1.02 (ref 1.005–1.030)
pH: 7.5 (ref 5.0–8.0)

## 2021-04-25 LAB — COMPREHENSIVE METABOLIC PANEL
ALT: 15 U/L (ref 0–44)
AST: 18 U/L (ref 15–41)
Albumin: 4.1 g/dL (ref 3.5–5.0)
Alkaline Phosphatase: 76 U/L (ref 38–126)
Anion gap: 10 (ref 5–15)
BUN: 23 mg/dL — ABNORMAL HIGH (ref 6–20)
CO2: 22 mmol/L (ref 22–32)
Calcium: 9.2 mg/dL (ref 8.9–10.3)
Chloride: 104 mmol/L (ref 98–111)
Creatinine, Ser: 0.99 mg/dL (ref 0.44–1.00)
GFR, Estimated: >60 mL/min (ref >60)
Glucose, Bld: 117 mg/dL — ABNORMAL HIGH (ref 70–99)
Potassium: 4.1 mmol/L (ref 3.5–5.1)
Sodium: 136 mmol/L (ref 135–145)
Total Bilirubin: 0.8 mg/dL (ref 0.3–1.2)
Total Protein: 7.6 g/dL (ref 6.5–8.1)

## 2021-04-25 LAB — RESP PANEL BY RT-PCR (FLU A&B, COVID) ARPGX2
Influenza A by PCR: NEGATIVE
Influenza B by PCR: NEGATIVE
SARS Coronavirus 2 by RT PCR: NEGATIVE

## 2021-04-25 LAB — LIPASE, BLOOD: Lipase: 27 U/L (ref 11–51)

## 2021-04-25 LAB — PREGNANCY, URINE: Preg Test, Ur: NEGATIVE

## 2021-04-25 MED ORDER — MONTELUKAST SODIUM 10 MG PO TABS
10.0000 mg | ORAL_TABLET | Freq: Every day | ORAL | Status: DC
  Administered 2021-04-26 – 2021-04-29 (×4): 10 mg via ORAL
  Filled 2021-04-25 (×4): qty 1

## 2021-04-25 MED ORDER — ALBUTEROL SULFATE (2.5 MG/3ML) 0.083% IN NEBU: 2.5000 mg | INHALATION_SOLUTION | RESPIRATORY_TRACT | Status: DC | PRN

## 2021-04-25 MED ORDER — PROCHLORPERAZINE EDISYLATE 10 MG/2ML IJ SOLN
10.0000 mg | Freq: Once | INTRAMUSCULAR | Status: AC
  Administered 2021-04-25: 10 mg via INTRAVENOUS
  Filled 2021-04-25: qty 2

## 2021-04-25 MED ORDER — LISINOPRIL 5 MG PO TABS
5.0000 mg | ORAL_TABLET | Freq: Every day | ORAL | Status: DC
  Administered 2021-04-25 – 2021-04-28 (×4): 5 mg via ORAL
  Filled 2021-04-25 (×5): qty 1

## 2021-04-25 MED ORDER — ALPRAZOLAM 1 MG PO TABS
1.0000 mg | ORAL_TABLET | Freq: Four times a day (QID) | ORAL | Status: DC | PRN
  Administered 2021-04-25 – 2021-04-28 (×5): 1 mg via ORAL
  Filled 2021-04-25 (×5): qty 1

## 2021-04-25 MED ORDER — METOCLOPRAMIDE HCL 5 MG/ML IJ SOLN
10.0000 mg | Freq: Three times a day (TID) | INTRAMUSCULAR | Status: DC
  Administered 2021-04-25 – 2021-04-27 (×5): 10 mg via INTRAVENOUS
  Filled 2021-04-25 (×5): qty 2

## 2021-04-25 MED ORDER — ATORVASTATIN CALCIUM 20 MG PO TABS
20.0000 mg | ORAL_TABLET | Freq: Every day | ORAL | Status: DC
  Administered 2021-04-26 – 2021-04-29 (×4): 20 mg via ORAL
  Filled 2021-04-25 (×4): qty 1

## 2021-04-25 MED ORDER — ACETAMINOPHEN 650 MG RE SUPP: 650.0000 mg | Freq: Four times a day (QID) | RECTAL | Status: DC | PRN

## 2021-04-25 MED ORDER — VENLAFAXINE HCL ER 150 MG PO CP24
150.0000 mg | ORAL_CAPSULE | Freq: Every day | ORAL | Status: DC
  Administered 2021-04-26 – 2021-04-29 (×4): 150 mg via ORAL
  Filled 2021-04-25 (×4): qty 1

## 2021-04-25 MED ORDER — ACETAMINOPHEN 325 MG PO TABS: 650.0000 mg | ORAL_TABLET | Freq: Four times a day (QID) | ORAL | Status: DC | PRN

## 2021-04-25 MED ORDER — ENOXAPARIN SODIUM 80 MG/0.8ML IJ SOSY
65.0000 mg | PREFILLED_SYRINGE | INTRAMUSCULAR | Status: DC
  Administered 2021-04-25 – 2021-04-28 (×4): 65 mg via SUBCUTANEOUS
  Filled 2021-04-25 (×4): qty 0.8

## 2021-04-25 MED ORDER — SODIUM CHLORIDE 0.9 % IV SOLN: Freq: Once | INTRAVENOUS | Status: AC

## 2021-04-25 MED ORDER — SENNOSIDES-DOCUSATE SODIUM 8.6-50 MG PO TABS: 1.0000 | ORAL_TABLET | Freq: Every evening | ORAL | Status: DC | PRN

## 2021-04-25 MED ORDER — IOHEXOL 300 MG/ML  SOLN
100.0000 mL | Freq: Once | INTRAMUSCULAR | Status: AC | PRN
  Administered 2021-04-25: 100 mL via INTRAVENOUS

## 2021-04-25 MED ORDER — TRAZODONE HCL 50 MG PO TABS
50.0000 mg | ORAL_TABLET | Freq: Every day | ORAL | Status: DC
  Administered 2021-04-25 – 2021-04-28 (×4): 50 mg via ORAL
  Filled 2021-04-25 (×4): qty 1

## 2021-04-25 MED ORDER — PROCHLORPERAZINE EDISYLATE 10 MG/2ML IJ SOLN
10.0000 mg | Freq: Four times a day (QID) | INTRAMUSCULAR | Status: DC | PRN
  Administered 2021-04-25 – 2021-04-28 (×6): 10 mg via INTRAVENOUS
  Filled 2021-04-25 (×7): qty 2

## 2021-04-25 MED ORDER — BUSPIRONE HCL 5 MG PO TABS
15.0000 mg | ORAL_TABLET | Freq: Two times a day (BID) | ORAL | Status: DC
  Administered 2021-04-25 – 2021-04-29 (×8): 15 mg via ORAL
  Filled 2021-04-25 (×8): qty 3

## 2021-04-25 MED ORDER — DROPERIDOL 2.5 MG/ML IJ SOLN
1.2500 mg | Freq: Once | INTRAMUSCULAR | Status: AC
  Administered 2021-04-25: 1.25 mg via INTRAVENOUS
  Filled 2021-04-25: qty 2

## 2021-04-25 MED ORDER — ARIPIPRAZOLE 10 MG PO TABS
5.0000 mg | ORAL_TABLET | Freq: Every day | ORAL | Status: DC
  Administered 2021-04-25 – 2021-04-28 (×4): 5 mg via ORAL
  Filled 2021-04-25 (×4): qty 1

## 2021-04-25 MED ORDER — LACTATED RINGERS IV BOLUS
1000.0000 mL | Freq: Once | INTRAVENOUS | Status: AC
  Administered 2021-04-25: 1000 mL via INTRAVENOUS

## 2021-04-25 MED ORDER — HYDROXYZINE HCL 25 MG PO TABS
50.0000 mg | ORAL_TABLET | Freq: Every morning | ORAL | Status: DC
  Administered 2021-04-26 – 2021-04-29 (×4): 50 mg via ORAL
  Filled 2021-04-25 (×4): qty 2

## 2021-04-25 MED ORDER — BUPROPION HCL ER (XL) 300 MG PO TB24
300.0000 mg | ORAL_TABLET | Freq: Every morning | ORAL | Status: DC
  Administered 2021-04-26 – 2021-04-29 (×4): 300 mg via ORAL
  Filled 2021-04-25 (×4): qty 1

## 2021-04-25 MED ORDER — LABETALOL HCL 5 MG/ML IV SOLN
10.0000 mg | Freq: Four times a day (QID) | INTRAVENOUS | Status: DC | PRN
  Filled 2021-04-25: qty 4

## 2021-04-25 NOTE — Assessment & Plan Note (Addendum)
Reports remote history of past gastric dumping syndrome without prior history of gastric bypass.  Has early satiety with negative gastric emptying study July 2022.  Acute symptoms may be related to up titration of Ozempic?  Half life is 1 week, so symptoms may linger.   CT abdomen/pelvis negative for acute etiology She's tolerating soft diet today prior to discharge Will discharge with reglan and compazine, short courses to use as needed

## 2021-04-25 NOTE — Assessment & Plan Note (Signed)
Patient states she is scheduled for sleep study in March.  Not currently using CPAP.

## 2021-04-25 NOTE — Assessment & Plan Note (Signed)
Resume home Abilify, bupropion, BuSpar, hydroxyzine, Xanax as needed.

## 2021-04-25 NOTE — ED Notes (Signed)
Pt vomited a small amount of bile like substance and is requesting another dose of compazine. EDP notified

## 2021-04-25 NOTE — Assessment & Plan Note (Signed)
Continue atorvastatin

## 2021-04-25 NOTE — ED Provider Notes (Signed)
[facility] EMERGENCY DEPARTMENT Provider Note   [name]: [id number] Arrival date & time: 04/25/21  0126     History  Chief Complaint  Patient presents with   Emesis    Tenia S Massenburg Oval Linsey is a 41 y.o. female.   Emesis Severity:  Mild Duration:  14 hours Timing:  Constant Quality:  Stomach contents Progression:  Unchanged Chronicity:  Recurrent Recent urination:  Normal Relieved by:  Nothing Worsened by:  Nothing Associated symptoms: abdominal pain (with emesis) and chills       Home Medications Prior to Admission medications   Medication Sig Start Date End Date Taking? Authorizing Provider  ALPRAZolam (XANAX) 0.25 MG tablet Take 0.5 mg by mouth 4 (four) times daily as needed for sleep. Reported on 03/03/2015    [provider]  amphetamine-dextroamphetamine (ADDERALL) 30 MG tablet Take 30 mg by mouth 2 (two) times daily.    [provider]  atorvastatin (LIPITOR) 20 MG tablet Take 20 mg by mouth daily.    [provider]  desvenlafaxine (PRISTIQ) 100 MG 24 hr tablet Take 100 mg by mouth daily.    [provider]  Doxepin HCl (SILENOR) 3 MG TABS Take by mouth.    [provider]  ergocalciferol (VITAMIN D2) 50000 UNITS capsule Take 50,000 Units by mouth 2 (two) times a week.     [provider]  GABAPENTIN PO Take 3 tablets by mouth.    [provider]  methocarbamol (ROBAXIN) 500 MG tablet Take 1 tablet (500 mg total) by mouth 2 (two) times daily. 02/27/17   Shary Decamp, PA-C  ondansetron (ZOFRAN-ODT) 4 MG disintegrating tablet Take 1 tablet (4 mg total) by mouth every 8 (eight) hours as needed for nausea or vomiting. 03/09/14   Star Age, MD  SUMAtriptan (IMITREX) 20 MG/ACT nasal spray Place 1 spray (20 mg total) into the nose every 2 (two) hours as needed for migraine or headache. May repeat in 2 hours if headache persists or recurs. 01/17/16   McVey, Gelene Mink, PA-C  SUMAtriptan  (IMITREX) 50 MG tablet Take 1 tablet (50 mg total) by mouth once. May repeat in 2 hours if needed. No more than 2 per 24 hours. No more than 3 times per week. 01/17/16 05/17/16  McVey, Gelene Mink, PA-C  TRAZODONE HCL PO Take 3 tablets by mouth.    [provider]      Allergies    Patient has no known allergies.    Review of Systems   Review of Systems  Constitutional:  Positive for chills.  Gastrointestinal:  Positive for abdominal pain (with emesis) and vomiting.   Physical Exam Updated Vital Signs BP (!) 157/116    Pulse 63    Temp 98.2 F (36.8 C) (Oral)    Resp 17    Ht 5\' 9"  (1.753 m)    Wt 131.5 kg    LMP 04/11/2021    SpO2 100%    BMI 42.83 kg/m  Physical Exam Vitals and nursing note reviewed.  Constitutional:      Appearance: She is well-developed.  HENT:     Head: Normocephalic and atraumatic.     Nose: Nose normal. No congestion or rhinorrhea.     Mouth/Throat:     Mouth: Mucous membranes are moist.     Pharynx: Oropharynx is clear.  Eyes:     Pupils: Pupils are equal, round, and reactive to light.  Cardiovascular:     Rate and Rhythm: Normal rate and regular  rhythm.  Pulmonary:     Effort: Pulmonary effort is normal. No respiratory distress.     Breath sounds: No stridor.  Abdominal:     General: Abdomen is flat. There is no distension.     Comments: Emesis while evaluating her  Musculoskeletal:        General: No swelling or tenderness. Normal range of motion.     Cervical back: Normal range of motion.  Skin:    General: Skin is warm and dry.  Neurological:     General: No focal deficit present.     Mental Status: She is alert.    ED Results / Procedures / Treatments   Labs (all labs ordered are listed, but only abnormal results are displayed) Labs Reviewed  COMPREHENSIVE METABOLIC PANEL - Abnormal; Notable for the following components:      Result Value   Glucose, Bld 117 (*)    BUN 23 (*)    All other components within normal limits   URINALYSIS, ROUTINE W REFLEX MICROSCOPIC - Abnormal; Notable for the following components:   Bilirubin Urine SMALL (*)    Protein, ur 30 (*)    Leukocytes,Ua SMALL (*)    All other components within normal limits  URINALYSIS, MICROSCOPIC (REFLEX) - Abnormal; Notable for the following components:   Bacteria, UA MANY (*)    All other components within normal limits  URINE CULTURE  LIPASE, BLOOD  CBC  PREGNANCY, URINE    EKG EKG Interpretation  Date/Time:  Tuesday April 25 2021 05:49:20 EST Ventricular Rate:  67 PR Interval:  136 QRS Duration: 91 QT Interval:  411 QTC Calculation: 434 R Axis:   0 Text Interpretation: Sinus rhythm Confirmed by Merrily Pew [phone]) on 04/25/2021 6:11:10 AM  Radiology No results found.  Procedures Procedures    Medications Ordered in ED Medications  lactated ringers bolus 1,000 mL (0 mLs Intravenous Stopped 04/25/21 0540)  droperidol (INAPSINE) 2.5 MG/ML injection 1.25 mg (1.25 mg Intravenous Given 04/25/21 0432)  prochlorperazine (COMPAZINE) injection 10 mg (10 mg Intravenous Given 04/25/21 [id number])    ED Course/ Medical Decision Making/ A&P                           Medical Decision Making Amount and/or Complexity of Data Reviewed Labs: ordered. Radiology: ordered. ECG/medicine tests: ordered.  Risk Prescription drug management. Decision regarding hospitalization.   History of gastric dumping syndrome of unclear etiology. Over last 12-14 hours has had incessant emesis not improved with ODT zofran or phenergan suppository. Abdomen benign. No fever. No urinary symptoms. States she had to be admitted last time this happened.  Droperidol didn't help much. Fluids infusin, will try compazine.  Compazine seems to have helped but patient is very drowsy and fluids still infusing. Not able to participate in PO challenge at this time.  Sleeping well. Doesn't appear to be nauseous or vomiting, will transfer care to oncoming provider pending reeval  and disposition.    Final Clinical Impression(s) / ED Diagnoses Final diagnoses:  Intractable nausea and vomiting    Rx / DC Orders ED Discharge Orders     None         Brent Taillon, Corene Cornea, MD 04/26/21 [phone]

## 2021-04-25 NOTE — ED Notes (Signed)
Phone Handoff Report provided to [facility] with [employer]

## 2021-04-25 NOTE — Progress Notes (Signed)
Plan of Care Note for accepted transfer   Patient: Monique Shaw MRN: [id number]   DOA: 04/25/2021  Facility requesting transfer: [facility] Requesting Provider: Dr. Wallace Cullens Reason for transfer: Intractable N/V Facility course: 41 yo F presenting with N/V/D. History of gastric dumping syndrome w/o a gastric bypass. Tried zofran, phenergan at home. No diarrhea at ED. Given compazine but not helping. Labs normal. CT negative. Remains w/ active vomiting. No focal pain on exam. COVID negative.   Plan of care: The patient is accepted for admission to Med-surg  unit, at [facility] or [facility]; whichever opens first. Thanks.   Author: Teddy Spike, DO 04/25/2021  Check [email or web address] for on-call coverage.  Nursing staff, Please call [facility] Admits & Consults System-Wide number on Amion as soon as patient's arrival, so appropriate admitting provider can evaluate the pt.

## 2021-04-25 NOTE — H&P (Signed)
History and Physical    Monique Shaw [id number] DOB: 25-May-1980 DOA: 04/25/2021  PCP: Hermine Messick, MD  Patient coming from: Home  I have personally briefly reviewed patient's old medical records in [place]  Chief Complaint: Nausea, vomiting  HPI: Monique Shaw Oval Linsey is a 41 y.o. female with medical history significant for asthma, hypertension, hyperlipidemia, anxiety, GERD, IBS, migraines, ADHD, morbid obesity, sleep apnea who presents to the ED for evaluation of intractable nausea and vomiting.  Patient reports feeling generally unwell afternoon of 04/24/2021.  She had a stromboli around 3 PM and afterwards began to have frequent nausea and vomiting.  She had vague upper abdominal discomfort associated with her symptoms.  Initial emesis contained food but subsequently became yellow and dark.  She has not been able to maintain any adequate oral intake.  She has had 2 episodes of loose stool, last bowel movement yesterday.  She has had some chills and diaphoresis but no fevers.  She has not had any chest pain or dyspnea.  She reports a similar episode in 2016 at which time she had to be admitted.  She says she was diagnosed with fast gastric dumping syndrome at that time.  Gastric emptying study July 2022 was a normal study.  Patient does report recent initiation of Ozempic for weight loss 3 months ago.  She has been titrating up to the maximum dose about 2 weeks ago.  Last took Ozempic day symptoms began (2/6).  [place] ED Course   Labs/Imaging on admission: I have personally reviewed following labs and imaging studies.  Initial vitals showed BP 157/116, pulse 64, RR 19, SPO2 100% on room air.  Labs show sodium 136, potassium 4.1, bicarb 22, BUN 23, creatinine 0.99, serum glucose 117, LFTs within normal limits, lipase 27, WBC 7.9, hemoglobin 15.0, platelets 249,000.  Urine pregnancy test negative.  Urinalysis shows negative ketones, 30  protein, negative nitrites, small leukocytes, 0-5 RBC/hpf, 6-10 WBC/hpf, many bacteria microscopy.  Urine culture in process.  SARS-CoV-2 and influenza PCR negative.  CT abdomen/pelvis with contrast negative for acute abdominal or pelvic pathology.  Hepatic steatosis noted.  Patient was given IV droperidol, IV Compazine 10 mg x 3, 1 L LR.  The hospitalist service was consulted to admit for further evaluation and management of intractable nausea and vomiting.  Review of Systems: All systems reviewed and are negative except as documented in history of present illness above.   Past Medical History:  Diagnosis Date   Allergy    Anxiety    ASCVD (arteriosclerotic cardiovascular disease) 2011   Asthma    Blood type, Rh negative    Depression    GERD (gastroesophageal reflux disease)    H/O migraine 10/25/10   H/O toxoplasmosis    H/O varicella    pt denies    History of elevated lipids 2011   Hx: UTI (urinary tract infection) 2007   Hypertension    IBS (irritable bowel syndrome)    Irregular menstrual cycle 2004   Migraine    Obese    Sleep apnea    Weight gain 2004    Past Surgical History:  Procedure Laterality Date   gastroenteritis     WISDOM TOOTH EXTRACTION      Social History:  reports that she has never smoked. She has never used smokeless tobacco. She reports that she does not drink alcohol and does not use drugs.  No Known Allergies  Family History  Problem Relation Age  of Onset   Heart disease Mother    Hypertension Mother    Cancer Mother    Hypertension Father    Anemia Father    Hypertension Maternal Grandmother    Heart disease Paternal Grandfather        MI   Cancer Maternal Uncle    Cancer Paternal Aunt    Alcohol abuse Paternal Uncle      Prior to Admission medications   Medication Sig Start Date End Date Taking? Authorizing Provider  albuterol (VENTOLIN HFA) 108 (90 Base) MCG/ACT inhaler 2 puffs every 4 (four) hours as needed for wheezing or  shortness of breath. 12/15/20  Yes [provider]  ALPRAZolam (XANAX) 1 MG tablet Take 1 mg by mouth 4 (four) times daily as needed for anxiety. 02/20/21  Yes [provider]  ARIPiprazole (ABILIFY) 5 MG tablet Take 5 mg by mouth at bedtime. 04/05/21  Yes [provider]  atorvastatin (LIPITOR) 20 MG tablet Take 20 mg by mouth daily.   Yes [provider]  buPROPion (WELLBUTRIN XL) 300 MG 24 hr tablet Take 300 mg by mouth every morning. 03/27/21  Yes [provider]  busPIRone (BUSPAR) 15 MG tablet Take 15 mg by mouth 2 (two) times daily. 03/02/21  Yes [provider]  Cholecalciferol (VITAMIN D3) 1.25 MG (50000 UT) CAPS Take 1 capsule by mouth once a week. 02/21/21  Yes [provider]  desvenlafaxine (PRISTIQ) 100 MG 24 hr tablet Take 100 mg by mouth daily.   Yes [provider]  famotidine (PEPCID) 40 MG tablet Take 40 mg by mouth daily. 03/08/21  Yes [provider]  hydrOXYzine (ATARAX) 25 MG tablet Take 50 mg by mouth in the morning. 02/20/21  Yes [provider]  montelukast (SINGULAIR) 10 MG tablet Take 10 mg by mouth daily. 04/04/21  Yes [provider]  ondansetron (ZOFRAN-ODT) 4 MG disintegrating tablet Take 1 tablet (4 mg total) by mouth every 8 (eight) hours as needed for nausea or vomiting. 03/09/14  Yes Star Age, MD  OZEMPIC, 2 MG/DOSE, 8 MG/3ML SOPN Inject 2 mg into the skin once a week. 04/03/21  Yes [provider]  pantoprazole (PROTONIX) 40 MG tablet Take 40 mg by mouth daily. 03/08/21  Yes [provider]  promethazine (PHENERGAN) 25 MG suppository Place 25 mg rectally every 6 (six) hours as needed for vomiting or nausea. 02/20/21  Yes [provider]  QELBREE 200 MG 24 hr capsule Take 200 mg by mouth daily. 04/14/21  Yes [provider]  SUMAtriptan (IMITREX) 100 MG tablet Take 100 mg by mouth every 2 (two) hours as needed for migraine or headache.  04/24/21  Yes [provider]  SUMAtriptan (IMITREX) 20 MG/ACT nasal spray Place 1 spray (20 mg total) into the nose every 2 (two) hours as needed for migraine or headache. May repeat in 2 hours if headache persists or recurs. 01/17/16  Yes McVey, Gelene Mink, PA-C  traZODone (DESYREL) 50 MG tablet Take 50 mg by mouth at bedtime. 02/22/21  Yes [provider]  azelastine (OPTIVAR) 0.05 % ophthalmic solution Place 1 drop into both eyes daily. 12/15/20   [provider]  Azelastine HCl 137 MCG/SPRAY SOLN SMARTSIG:1-2 Spray(s) Both Nares Twice Daily 12/15/20   [provider]  lisinopril (ZESTRIL) 5 MG tablet Take 5 mg by mouth daily. 03/08/21   [provider]  methocarbamol (ROBAXIN) 500 MG tablet Take 1 tablet (500 mg total) by mouth 2 (two) times daily.  Patient not taking: Reported on 04/25/2021 02/27/17   Shary Decamp, PA-C  SUMAtriptan (IMITREX) 50 MG tablet Take 1 tablet (50 mg total) by mouth once. May repeat in 2 hours if needed. No more than 2 per 24 hours. No more than 3 times per week. 01/17/16 05/17/16  McVey, Gelene Mink, PA-C    Physical Exam: Vitals:   04/25/21 1330 04/25/21 1500 04/25/21 1600 04/25/21 1814  BP: (!) 138/93 (!) 142/92 (!) 150/68 (!) 164/108  Pulse: 78   79  Resp: 18 18 17 18   Temp:    98.4 F (36.9 C)  TempSrc:    Oral  SpO2: 98% 98% 100% 100%  Weight:      Height:       Constitutional: Resting supine in bed, NAD, calm, comfortable Eyes: PERRL, lids and conjunctivae normal ENMT: Mucous membranes are dry. Posterior pharynx clear of any exudate or lesions.Normal dentition.  Neck: normal, supple, no masses. Respiratory: clear to auscultation bilaterally, no wheezing, no crackles. Normal respiratory effort. No accessory muscle use.  Cardiovascular: Regular rate and rhythm, no murmurs / rubs / gallops. No extremity edema. 2+ pedal pulses. Abdomen: no tenderness, no masses palpated. No hepatosplenomegaly. Bowel sounds  diminished.  Musculoskeletal: no clubbing / cyanosis. No joint deformity upper and lower extremities. Good ROM, no contractures. Normal muscle tone.  Skin: no rashes, lesions, ulcers. No induration Neurologic: CN 2-12 grossly intact. Sensation intact. Strength 5/5 in all 4.  Psychiatric: Normal judgment and insight. Alert and oriented x 3. Normal mood.   EKG: Personally reviewed. Normal sinus rhythm without acute ischemic changes.  Assessment/Plan Principal Problem:   Intractable nausea and vomiting Active Problems:   Anxiety   Essential hypertension   Hyperlipidemia   OSA (obstructive sleep apnea)   Asthma   Aeralyn S Massenburg Oval Linsey is a 41 y.o. female with medical history significant for asthma, hypertension, hyperlipidemia, anxiety, GERD, IBS, migraines, ADHD, morbid obesity, sleep apnea who is admitted with intractable nausea and vomiting.  CT abdomen/pelvis negative for acute etiology.  Placed on scheduled and as needed antiemetics.  Assessment and Plan: * Intractable nausea and vomiting- (present on admission) Reports remote history of past gastric dumping syndrome without prior history of gastric bypass.  Has early satiety with negative gastric emptying study July 2022.  Symptoms improving with Compazine, she is requesting trial of clear liquids.  Acute symptoms may be related to up titration of Ozempic.  CT abdomen/pelvis negative for acute etiology. -Advance diet as tolerated -Continue IV Compazine as needed with IV Reglan scheduled every 8 hours  Anxiety- (present on admission) Resume home Abilify, bupropion, BuSpar, hydroxyzine, Xanax as needed.  Essential hypertension- (present on admission) Continue lisinopril.  Hyperlipidemia- (present on admission) Continue atorvastatin.  Asthma- (present on admission) Stable without wheezing.  Continue albuterol as needed and Singulair.  OSA (obstructive sleep apnea)- (present on admission) Patient states she is scheduled for  sleep study in March.  Not currently using CPAP.  DVT prophylaxis:  Lovenox Code Status: Full code, confirmed on admission Family Communication: Discussed with patient's mother at bedside Disposition Plan: From home and likely discharge to home pending clinical progress Consults called: None Severity of Illness: The appropriate patient status for this patient is OBSERVATION. Observation status is judged to be reasonable and necessary in order to provide the required intensity of service to ensure the patient's safety. The patient's presenting symptoms, physical exam findings, and initial radiographic and laboratory data in the context of their medical condition is felt to place  them at decreased risk for further clinical deterioration. Furthermore, it is anticipated that the patient will be medically stable for discharge from the hospital within 2 midnights of admission.   Zada Finders MD [facility]  If 7PM-7AM, please contact night-coverage [email or web address]  04/25/2021, 7:47 PM

## 2021-04-25 NOTE — Assessment & Plan Note (Signed)
Continue lisinopril

## 2021-04-25 NOTE — Hospital Course (Addendum)
Monique Shaw is Monique Shaw 41 y.o. female with medical history significant for asthma, hypertension, hyperlipidemia, anxiety, GERD, IBS, migraines, ADHD, morbid obesity, sleep apnea who is admitted with intractable nausea and vomiting.  CT abdomen/pelvis negative for acute etiology.  Placed on scheduled and as needed antiemetics.  Symptoms were thought to be related to her uptitration of ozempic.  Her symptoms were prolonged, but have finally improved.  She'll discharge today with plan for outpatient follow up.  See below for additional details

## 2021-04-25 NOTE — ED Provider Notes (Signed)
° °  Pt is a 41 yo female with pmh of IBS and previously dx gastric dumping syndrome in 2016 without hx of gastric bypass surgery presenting for nausea/vomiting. Pt states her symptoms have been intermittent including over 7 episodes of n/v with generalized abdominal discomfort. Failed improvement of symptoms after home zofran and promethazine. Pt has had repeat imaging demonstrating resolution of gastric dumping syndrome.   ED workup. Stable vitals. Stable labs. Little to no improvement after droperidol and compazine in ED. 1L IVF given.   Physical Exam  BP 124/70    Pulse 72    Temp 98.2 F (36.8 C) (Oral)    Resp 20    Ht 5\' 9"  (1.753 m)    Wt 131.5 kg    LMP 04/11/2021    SpO2 99%    BMI 42.83 kg/m   Physical Exam Vitals and nursing note reviewed.  Constitutional:      General: She is not in acute distress.    Appearance: She is well-developed.  HENT:     Head: Normocephalic and atraumatic.  Eyes:     Conjunctiva/sclera: Conjunctivae normal.  Cardiovascular:     Rate and Rhythm: Normal rate and regular rhythm.     Heart sounds: No murmur heard. Pulmonary:     Effort: Pulmonary effort is normal. No respiratory distress.     Breath sounds: Normal breath sounds.  Abdominal:     Palpations: Abdomen is soft.     Tenderness: There is no abdominal tenderness.  Musculoskeletal:        General: No swelling.     Cervical back: Neck supple.  Skin:    General: Skin is warm and dry.     Capillary Refill: Capillary refill takes less than 2 seconds.  Neurological:     Mental Status: She is alert.  Psychiatric:        Mood and Affect: Mood normal.    Procedures  Procedures  ED Course / MDM    Medical Decision Making Amount and/or Complexity of Data Reviewed Labs: ordered. Radiology: ordered. ECG/medicine tests: ordered.  Risk Prescription drug management. Decision regarding hospitalization.   8:05 AM Patient is Aox3, no acute distress, afebrile, with stable vitals and  labs. On re-evaluation patient still unable to tolerate PO. ECG demonstrates stable QTC. Compazine reordered with maintenance fluids started.   Covid negative.   Patient accepted for inpatient placement by Dr. 04/13/2021.    Ronaldo Miyamoto P, DO 04/27/21 1107

## 2021-04-25 NOTE — Assessment & Plan Note (Signed)
Stable without wheezing.  Continue albuterol as needed and Singulair.

## 2021-04-25 NOTE — ED Triage Notes (Signed)
Pt reports nausea and vomiting "nonstop since 3 oclock this afternoon" and states her prescription zofran and phenergen is not working; unable to keep down anything.  She reports intermittent abd pain.

## 2021-04-26 DIAGNOSIS — R112 Nausea with vomiting, unspecified: Secondary | ICD-10-CM | POA: Diagnosis not present

## 2021-04-26 DIAGNOSIS — R8281 Pyuria: Secondary | ICD-10-CM

## 2021-04-26 LAB — BASIC METABOLIC PANEL
Anion gap: 8 (ref 5–15)
BUN: 20 mg/dL (ref 6–20)
CO2: 21 mmol/L — ABNORMAL LOW (ref 22–32)
Calcium: 8.6 mg/dL — ABNORMAL LOW (ref 8.9–10.3)
Chloride: 105 mmol/L (ref 98–111)
Creatinine, Ser: 0.98 mg/dL (ref 0.44–1.00)
GFR, Estimated: >60 mL/min (ref >60)
Glucose, Bld: 106 mg/dL — ABNORMAL HIGH (ref 70–99)
Potassium: 4.6 mmol/L (ref 3.5–5.1)
Sodium: 134 mmol/L — ABNORMAL LOW (ref 135–145)

## 2021-04-26 LAB — URINE CULTURE

## 2021-04-26 LAB — CBC
HCT: 44.5 % (ref 36.0–46.0)
Hemoglobin: 15.1 g/dL — ABNORMAL HIGH (ref 12.0–15.0)
MCH: 30.2 pg (ref 26.0–34.0)
MCHC: 33.9 g/dL (ref 30.0–36.0)
MCV: 89 fL (ref 80.0–100.0)
Platelets: 237 10*3/uL (ref 150–400)
RBC: 5 MIL/uL (ref 3.87–5.11)
RDW: 13.6 % (ref 11.5–15.5)
WBC: 11.1 10*3/uL — ABNORMAL HIGH (ref 4.0–10.5)
nRBC: 0 % (ref 0.0–0.2)

## 2021-04-26 MED ORDER — LACTATED RINGERS IV SOLN: INTRAVENOUS | Status: AC

## 2021-04-26 NOTE — Assessment & Plan Note (Addendum)
Follow urine cx - multiple species Follow for sx

## 2021-04-26 NOTE — Progress Notes (Signed)
Transition of Care [facility]) Screening Note  Patient Details  Name: Monique Shaw Date of Birth: 14-Oct-1980  Transition of Care [facility]) CM/SW Contact:    Sherie Don, LCSW Phone Number: 04/26/2021, 11:16 AM  Transition of Care Department [facility]) has reviewed patient and no TOC needs have been identified at this time. We will continue to monitor patient advancement through interdisciplinary progression rounds. If new patient transition needs arise, please place a TOC consult.

## 2021-04-26 NOTE — Progress Notes (Signed)
PROGRESS NOTE    Monique Shaw  ESP:[id number] DOB: 06-06-1980 DOA: 04/25/2021 PCP: Harvie Heck, MD  Chief Complaint  Patient presents with   Emesis    Brief Narrative:  Monique Shaw is Monique Shaw 41 y.o. female with medical history significant for asthma, hypertension, hyperlipidemia, anxiety, GERD, IBS, migraines, ADHD, morbid obesity, sleep apnea who is admitted with intractable nausea and vomiting.  CT abdomen/pelvis negative for acute etiology.  Placed on scheduled and as needed antiemetics.    Assessment & Plan:   Principal Problem:   Intractable nausea and vomiting Active Problems:   Anxiety   Essential hypertension   Hyperlipidemia   OSA (obstructive sleep apnea)   Asthma   Assessment and Plan: * Intractable nausea and vomiting- (present on admission) Reports remote history of past gastric dumping syndrome without prior history of gastric bypass.  Has early satiety with negative gastric emptying study July 2022.  Acute symptoms may be related to up titration of Ozempic?   CT abdomen/pelvis negative for acute etiology Continue clears for now, ADAT Continue IV Compazine as needed with IV Reglan scheduled every 8 hours Episode of nausea today, if recurrent will need bowel rest  Anxiety- (present on admission) Resume home Abilify, bupropion, BuSpar, hydroxyzine, Xanax as needed.  Essential hypertension- (present on admission) Continue lisinopril.  Hyperlipidemia- (present on admission) Continue atorvastatin.  OSA (obstructive sleep apnea)- (present on admission) Patient states she is scheduled for sleep study in March.  Not currently using CPAP.  Asthma- (present on admission) Stable without wheezing.  Continue albuterol as needed and Singulair.   DVT prophylaxis: lovenox Code Status: full Family Communication: none  Disposition:   Status is: Observation The patient will require care spanning > 2 midnights and should be moved to  inpatient because: continued nausea, inability to tolerate PO, need for IVF  Consultants:  none  Procedures:  none  Antimicrobials:  Anti-infectives (From admission, onward)    None      Subjective: C/o continued nausea, just had episode of emesis  Objective: Vitals:   04/26/21 0439 04/26/21 0811 04/26/21 0828 04/26/21 1311  BP: 132/78 137/80 129/75 138/78  Pulse: 78 65  70  Resp: 16 17    Temp: 98.2 F (36.8 C) 99.8 F (37.7 C)  98.4 F (36.9 C)  TempSrc: Oral Oral  Oral  SpO2: 97% 100%    Weight:      Height:        Intake/Output Summary (Last 24 hours) at 04/26/2021 1430 Last data filed at 04/25/2021 1706 Gross per 24 hour  Intake 1118 ml  Output --  Net 1118 ml   Filed Weights   04/25/21 0134  Weight: 131.5 kg    Examination:  General: No acute distress. Cardiovascular: RRR Lungs: unlabored Abdomen: Soft, nontender, nondistended  Neurological: Alert and oriented 3. Moves all extremities 4. Cranial nerves II through XII grossly intact. Skin: Warm and dry. No rashes or lesions. Extremities: No clubbing or cyanosis. No edema.   Data Reviewed: I have personally reviewed following labs and imaging studies  CBC: Recent Labs  Lab 04/25/21 0144 04/26/21 0941  WBC 7.9 11.1*  HGB 15.0 15.1*  HCT 44.3 44.5  MCV 89.0 89.0  PLT 249 237    Basic Metabolic Panel: Recent Labs  Lab 04/25/21 0144 04/26/21 0941  NA 136 134*  K 4.1 4.6  CL 104 105  CO2 22 21*  GLUCOSE 117* 106*  BUN 23* 20  CREATININE 0.99 0.98  CALCIUM 9.2  8.6*    GFR: Estimated Creatinine Clearance: 110.1 mL/min (by C-G formula based on SCr of 0.98 mg/dL).  Liver Function Tests: Recent Labs  Lab 04/25/21 0144  AST 18  ALT 15  ALKPHOS 76  BILITOT 0.8  PROT 7.6  ALBUMIN 4.1    CBG: No results for input(s): GLUCAP in the last 168 hours.   Recent Results (from the past 240 hour(s))  Urine Culture     Status: Abnormal   Collection Time: 04/25/21  1:44 AM    Specimen: Urine, Clean Catch  Result Value Ref Range Status   Specimen Description   Final    URINE, CLEAN CATCH Performed at [facility], [address] [place], [place] [address]    Special Requests   Final    NONE Performed at [facility], [address] [place], [place] [address]    Culture MULTIPLE SPECIES PRESENT, SUGGEST RECOLLECTION (Alissa Pharr)  Final   Report Status 04/26/2021 FINAL  Final  Resp Panel by RT-PCR (Flu Eufelia Veno&B, Covid) Nasopharyngeal Swab     Status: None   Collection Time: 04/25/21  8:10 AM   Specimen: Nasopharyngeal Swab; Nasopharyngeal(NP) swabs in vial transport medium  Result Value Ref Range Status   SARS Coronavirus 2 by RT PCR NEGATIVE NEGATIVE Final    Comment: (NOTE) SARS-CoV-2 target nucleic acids are NOT DETECTED.  The SARS-CoV-2 RNA is generally detectable in upper respiratory specimens during the acute phase of infection. The lowest concentration of SARS-CoV-2 viral copies this assay can detect is 138 copies/mL. Bo Rogue negative result does not preclude SARS-Cov-2 infection and should not be used as the sole basis for treatment or other patient management decisions. Willman Cuny negative result may occur with  improper specimen collection/handling, submission of specimen other than nasopharyngeal swab, presence of viral mutation(s) within the areas targeted by this assay, and inadequate number of viral copies(<138 copies/mL). Ryin Ambrosius negative result must be combined with clinical observations, patient history, and epidemiological information. The expected result is Negative.  Fact Sheet for Patients:  BloggerCourse.com  Fact Sheet for Healthcare Providers:  SeriousBroker.it  This test is no t yet approved or cleared by the [place] FDA and  has been authorized for detection and/or diagnosis of SARS-CoV-2 by FDA under an Emergency Use Authorization (EUA). This EUA will remain  in effect (meaning  this test can be used) for the duration of the COVID-19 declaration under Section 564(b)(1) of the Act, 21 U.S.C.section 360bbb-3(b)(1), unless the authorization is terminated  or revoked sooner.       Influenza Lalanya Rufener by PCR NEGATIVE NEGATIVE Final   Influenza B by PCR NEGATIVE NEGATIVE Final    Comment: (NOTE) The Xpert Xpress SARS-CoV-2/FLU/RSV plus assay is intended as an aid in the diagnosis of influenza from Nasopharyngeal swab specimens and should not be used as Labrandon Knoch sole basis for treatment. Nasal washings and aspirates are unacceptable for Xpert Xpress SARS-CoV-2/FLU/RSV testing.  Fact Sheet for Patients: BloggerCourse.com  Fact Sheet for Healthcare Providers: SeriousBroker.it  This test is not yet approved or cleared by the [place] FDA and has been authorized for detection and/or diagnosis of SARS-CoV-2 by FDA under an Emergency Use Authorization (EUA). This EUA will remain in effect (meaning this test can be used) for the duration of the COVID-19 declaration under Section 564(b)(1) of the Act, 21 U.S.C. section 360bbb-3(b)(1), unless the authorization is terminated or revoked.  Performed at [facility], [address], [place], [place] [address]  Radiology Studies: CT ABDOMEN PELVIS W CONTRAST  Result Date: 04/25/2021 CLINICAL DATA:  Abdominal pain. EXAM: CT ABDOMEN AND PELVIS WITH CONTRAST TECHNIQUE: Multidetector CT imaging of the abdomen and pelvis was performed using the standard protocol following bolus administration of intravenous contrast. RADIATION DOSE REDUCTION: This exam was performed according to the departmental dose-optimization program which includes automated exposure control, adjustment of the mA and/or kV according to patient size and/or use of iterative reconstruction technique. CONTRAST:  OMNIPAQUE IOHEXOL 300 MG/ML  SOLN COMPARISON:  None. FINDINGS: Lower chest: No acute  abnormality. Hepatobiliary: No focal liver abnormality is seen. Low-attenuation of the liver as can be seen with hepatic steatosis. No gallstones, gallbladder wall thickening, or biliary dilatation. Pancreas: Unremarkable. No pancreatic ductal dilatation or surrounding inflammatory changes. Spleen: Normal in size without focal abnormality. Adrenals/Urinary Tract: Adrenal glands are unremarkable. Kidneys are normal, without renal calculi, focal lesion, or hydronephrosis. Bladder is unremarkable. Stomach/Bowel: Stomach is within normal limits. No evidence of bowel wall thickening, distention, or inflammatory changes. Appendix is normal. Vascular/Lymphatic: No significant vascular findings are present. No enlarged abdominal or pelvic lymph nodes. Reproductive: Uterus and bilateral adnexa are unremarkable. T-type IUD in the uterus. Other: No abdominal wall hernia or abnormality. No abdominopelvic ascites. Musculoskeletal: No acute osseous abnormality. No aggressive osseous lesion. Mild osteoarthritis of bilateral SI joints. Degenerative disease with severe disc height loss at L5-S1. Minimal retrolisthesis of L5 on S1. Bilateral foraminal stenosis at L5-S1. Stimulator power pack along the left flank with the lead terminating in the medial aspect of the left gluteus maximus muscle. IMPRESSION: 1. No acute abdominal or pelvic pathology. 2. Hepatic steatosis. Electronically Signed   By: Elige Ko M.D.   On: 04/25/2021 08:55        Scheduled Meds:  ARIPiprazole  5 mg Oral QHS   atorvastatin  20 mg Oral Daily   buPROPion  300 mg Oral q morning   busPIRone  15 mg Oral BID   enoxaparin (LOVENOX) injection  65 mg Subcutaneous Q24H   hydrOXYzine  50 mg Oral q AM   lisinopril  5 mg Oral Daily   metoCLOPramide (REGLAN) injection  10 mg Intravenous Q8H   montelukast  10 mg Oral Daily   traZODone  50 mg Oral QHS   venlafaxine XR  150 mg Oral Q breakfast   Continuous Infusions:   LOS: 0 days    Time spent:  over 30 min    Lacretia Nicks, MD [facility]   To contact the attending provider between 7A-7P or the covering provider during after hours 7P-7A, please log into the web site [email or web address] and access using universal [place] password for that web site. If you do not have the password, please call the hospital operator.  04/26/2021, 2:30 PM

## 2021-04-27 DIAGNOSIS — F909 Attention-deficit hyperactivity disorder, unspecified type: Secondary | ICD-10-CM | POA: Diagnosis present

## 2021-04-27 DIAGNOSIS — K589 Irritable bowel syndrome without diarrhea: Secondary | ICD-10-CM | POA: Diagnosis present

## 2021-04-27 DIAGNOSIS — K760 Fatty (change of) liver, not elsewhere classified: Secondary | ICD-10-CM | POA: Diagnosis present

## 2021-04-27 DIAGNOSIS — Z79899 Other long term (current) drug therapy: Secondary | ICD-10-CM | POA: Diagnosis not present

## 2021-04-27 DIAGNOSIS — Z809 Family history of malignant neoplasm, unspecified: Secondary | ICD-10-CM | POA: Diagnosis not present

## 2021-04-27 DIAGNOSIS — Z20822 Contact with and (suspected) exposure to covid-19: Secondary | ICD-10-CM | POA: Diagnosis present

## 2021-04-27 DIAGNOSIS — R112 Nausea with vomiting, unspecified: Secondary | ICD-10-CM | POA: Diagnosis present

## 2021-04-27 DIAGNOSIS — T383X5A Adverse effect of insulin and oral hypoglycemic [antidiabetic] drugs, initial encounter: Secondary | ICD-10-CM | POA: Diagnosis present

## 2021-04-27 DIAGNOSIS — G4733 Obstructive sleep apnea (adult) (pediatric): Secondary | ICD-10-CM | POA: Diagnosis present

## 2021-04-27 DIAGNOSIS — Z8249 Family history of ischemic heart disease and other diseases of the circulatory system: Secondary | ICD-10-CM | POA: Diagnosis not present

## 2021-04-27 DIAGNOSIS — F419 Anxiety disorder, unspecified: Secondary | ICD-10-CM | POA: Diagnosis present

## 2021-04-27 DIAGNOSIS — K219 Gastro-esophageal reflux disease without esophagitis: Secondary | ICD-10-CM | POA: Diagnosis present

## 2021-04-27 DIAGNOSIS — E876 Hypokalemia: Secondary | ICD-10-CM | POA: Diagnosis present

## 2021-04-27 DIAGNOSIS — E785 Hyperlipidemia, unspecified: Secondary | ICD-10-CM | POA: Diagnosis present

## 2021-04-27 DIAGNOSIS — I10 Essential (primary) hypertension: Secondary | ICD-10-CM | POA: Diagnosis present

## 2021-04-27 DIAGNOSIS — Z6841 Body Mass Index (BMI) 40.0 and over, adult: Secondary | ICD-10-CM | POA: Diagnosis not present

## 2021-04-27 DIAGNOSIS — J45909 Unspecified asthma, uncomplicated: Secondary | ICD-10-CM | POA: Diagnosis present

## 2021-04-27 DIAGNOSIS — Z3202 Encounter for pregnancy test, result negative: Secondary | ICD-10-CM | POA: Diagnosis present

## 2021-04-27 HISTORY — DX: Nausea with vomiting, unspecified: R11.2

## 2021-04-27 LAB — CBC WITH DIFFERENTIAL/PLATELET
Abs Immature Granulocytes: 0.01 10*3/uL (ref 0.00–0.07)
Basophils Absolute: 0 10*3/uL (ref 0.0–0.1)
Basophils Relative: 0 %
Eosinophils Absolute: 0 10*3/uL (ref 0.0–0.5)
Eosinophils Relative: 0 %
HCT: 43.3 % (ref 36.0–46.0)
Hemoglobin: 14.3 g/dL (ref 12.0–15.0)
Immature Granulocytes: 0 %
Lymphocytes Relative: 22 %
Lymphs Abs: 1.4 10*3/uL (ref 0.7–4.0)
MCH: 30 pg (ref 26.0–34.0)
MCHC: 33 g/dL (ref 30.0–36.0)
MCV: 91 fL (ref 80.0–100.0)
Monocytes Absolute: 0.6 10*3/uL (ref 0.1–1.0)
Monocytes Relative: 10 %
Neutro Abs: 4.2 10*3/uL (ref 1.7–7.7)
Neutrophils Relative %: 68 %
Platelets: 219 10*3/uL (ref 150–400)
RBC: 4.76 MIL/uL (ref 3.87–5.11)
RDW: 13.3 % (ref 11.5–15.5)
WBC: 6.3 10*3/uL (ref 4.0–10.5)
nRBC: 0 % (ref 0.0–0.2)

## 2021-04-27 LAB — COMPREHENSIVE METABOLIC PANEL
ALT: 12 U/L (ref 0–44)
AST: 16 U/L (ref 15–41)
Albumin: 3.8 g/dL (ref 3.5–5.0)
Alkaline Phosphatase: 68 U/L (ref 38–126)
Anion gap: 8 (ref 5–15)
BUN: 16 mg/dL (ref 6–20)
CO2: 26 mmol/L (ref 22–32)
Calcium: 8.6 mg/dL — ABNORMAL LOW (ref 8.9–10.3)
Chloride: 102 mmol/L (ref 98–111)
Creatinine, Ser: 0.85 mg/dL (ref 0.44–1.00)
GFR, Estimated: >60 mL/min (ref >60)
Glucose, Bld: 101 mg/dL — ABNORMAL HIGH (ref 70–99)
Potassium: 3.5 mmol/L (ref 3.5–5.1)
Sodium: 136 mmol/L (ref 135–145)
Total Bilirubin: 0.6 mg/dL (ref 0.3–1.2)
Total Protein: 6.7 g/dL (ref 6.5–8.1)

## 2021-04-27 LAB — PHOSPHORUS: Phosphorus: 2.5 mg/dL (ref 2.5–4.6)

## 2021-04-27 LAB — HIV ANTIBODY (ROUTINE TESTING W REFLEX): HIV Screen 4th Generation wRfx: NONREACTIVE

## 2021-04-27 LAB — MAGNESIUM: Magnesium: 2.1 mg/dL (ref 1.7–2.4)

## 2021-04-27 MED ORDER — METOCLOPRAMIDE HCL 5 MG/ML IJ SOLN
10.0000 mg | Freq: Four times a day (QID) | INTRAMUSCULAR | Status: DC
  Administered 2021-04-27 – 2021-04-29 (×8): 10 mg via INTRAVENOUS
  Filled 2021-04-27 (×8): qty 2

## 2021-04-27 NOTE — Progress Notes (Signed)
PROGRESS NOTE    Monique Shaw  [id number] DOB: Sep 16, 1980 DOA: 04/25/2021 PCP: Hermine Messick, MD  Chief Complaint  Patient presents with   Emesis    Brief Narrative:  Monique Shaw is [name] 41 y.o. female with medical history significant for asthma, hypertension, hyperlipidemia, anxiety, GERD, IBS, migraines, ADHD, morbid obesity, sleep apnea who is admitted with intractable nausea and vomiting.  CT abdomen/pelvis negative for acute etiology.  Placed on scheduled and as needed antiemetics.    Assessment & Plan:   Principal Problem:   Intractable nausea and vomiting Active Problems:   Pyuria   Anxiety   Essential hypertension   Hyperlipidemia   OSA (obstructive sleep apnea)   Asthma   Nausea & vomiting   Assessment and Plan: * Intractable nausea and vomiting- (present on admission) Reports remote history of past gastric dumping syndrome without prior history of gastric bypass.  Has early satiety with negative gastric emptying study July 2022.  Acute symptoms may be related to up titration of Ozempic?  Half life is 1 week, symptoms may linger.   CT abdomen/pelvis negative for acute etiology Continue clears for now, ADAT Recurrent nausea/vomiting today Continue compazine, reglan increase to q6   Pyuria Follow urine cx - multiple species Follow for sx  Essential hypertension- (present on admission) Continue lisinopril.  Anxiety- (present on admission) Resume home Abilify, bupropion, BuSpar, hydroxyzine, Xanax as needed.  Hyperlipidemia- (present on admission) Continue atorvastatin.  OSA (obstructive sleep apnea)- (present on admission) Patient states she is scheduled for sleep study in March.  Not currently using CPAP.  Asthma- (present on admission) Stable without wheezing.  Continue albuterol as needed and Singulair.   DVT prophylaxis: lovenox Code Status: full Family Communication: none  Disposition:   Status is:  Observation The patient will require care spanning > 2 midnights and should be moved to inpatient because: continued nausea, inability to tolerate PO, need for IVF  Consultants:  none  Procedures:  none  Antimicrobials:  Anti-infectives (From admission, onward)    None      Subjective: Recurrent emesis today  Objective: Vitals:   04/26/21 0828 04/26/21 1311 04/26/21 2141 04/27/21 0427  BP: 129/75 138/78 132/77 (!) 141/78  Pulse:  70 69 65  Resp:   18 16  Temp:  98.4 F (36.9 C) 97.8 F (36.6 C) (!) 97.5 F (36.4 C)  TempSrc:  Oral Oral Oral  SpO2:   96% 97%  Weight:      Height:        Intake/Output Summary (Last 24 hours) at 04/27/2021 1430 Last data filed at 04/26/2021 2300 Gross per 24 hour  Intake 211.12 ml  Output --  Net 211.12 ml   Filed Weights   04/25/21 0134  Weight: 131.5 kg    Examination:  General: No acute distress. Cardiovascular: RRR Lungs: unlabored Abdomen: Soft, nontender, nondistended  Neurological: Alert and oriented 3. Moves all extremities 4 . Cranial nerves II through XII grossly intact. Skin: Warm and dry. No rashes or lesions. Extremities: No clubbing or cyanosis. No edema.  Data Reviewed: I have personally reviewed following labs and imaging studies  CBC: Recent Labs  Lab 04/25/21 0144 04/26/21 0941 04/27/21 0508  WBC 7.9 11.1* 6.3  NEUTROABS  --   --  4.2  HGB 15.0 15.1* 14.3  HCT 44.3 44.5 43.3  MCV 89.0 89.0 91.0  PLT 249 237 [id number]    Basic Metabolic Panel: Recent Labs  Lab 04/25/21 0144 04/26/21 0941 04/27/21 [id number]  NA 136 134* 136  K 4.1 4.6 3.5  CL 104 105 102  CO2 22 21* 26  GLUCOSE 117* 106* 101*  BUN 23* 20 16  CREATININE 0.99 0.98 0.85  CALCIUM 9.2 8.6* 8.6*  MG  --   --  2.1  PHOS  --   --  2.5    GFR: Estimated Creatinine Clearance: 126.9 mL/min (by C-G formula based on SCr of 0.85 mg/dL).  Liver Function Tests: Recent Labs  Lab 04/25/21 0144 04/27/21 0508  AST 18 16  ALT 15 12   ALKPHOS 76 68  BILITOT 0.8 0.6  PROT 7.6 6.7  ALBUMIN 4.1 3.8    CBG: No results for input(s): GLUCAP in the last 168 hours.   Recent Results (from the past 240 hour(s))  Urine Culture     Status: Abnormal   Collection Time: 04/25/21  1:44 AM   Specimen: Urine, Clean Catch  Result Value Ref Range Status   Specimen Description   Final    URINE, CLEAN CATCH Performed at [facility], [address] [place], [place] [address]    Special Requests   Final    NONE Performed at [facility], [address] [place], [place] [address]    Culture MULTIPLE SPECIES PRESENT, SUGGEST RECOLLECTION (Rital Cavey)  Final   Report Status 04/26/2021 FINAL  Final  Resp Panel by RT-PCR (Flu Nastassia Bazaldua&B, Covid) Nasopharyngeal Swab     Status: None   Collection Time: 04/25/21  8:10 AM   Specimen: Nasopharyngeal Swab; Nasopharyngeal(NP) swabs in vial transport medium  Result Value Ref Range Status   SARS Coronavirus 2 by RT PCR NEGATIVE NEGATIVE Final    Comment: (NOTE) SARS-CoV-2 target nucleic acids are NOT DETECTED.  The SARS-CoV-2 RNA is generally detectable in upper respiratory specimens during the acute phase of infection. The lowest concentration of SARS-CoV-2 viral copies this assay can detect is 138 copies/mL. Teigen Parslow negative result does not preclude SARS-Cov-2 infection and should not be used as the sole basis for treatment or other patient management decisions. Coner Gibbard negative result may occur with  improper specimen collection/handling, submission of specimen other than nasopharyngeal swab, presence of viral mutation(s) within the areas targeted by this assay, and inadequate number of viral copies(<138 copies/mL). Jessen Siegman negative result must be combined with clinical observations, patient history, and epidemiological information. The expected result is Negative.  Fact Sheet for Patients:  EntrepreneurPulse.com.au  Fact Sheet for Healthcare Providers:   IncredibleEmployment.be  This test is no t yet approved or cleared by the [place] FDA and  has been authorized for detection and/or diagnosis of SARS-CoV-2 by FDA under an Emergency Use Authorization (EUA). This EUA will remain  in effect (meaning this test can be used) for the duration of the COVID-19 declaration under Section 564(b)(1) of the Act, 21 U.S.C.section 360bbb-3(b)(1), unless the authorization is terminated  or revoked sooner.       Influenza Marques Ericson by PCR NEGATIVE NEGATIVE Final   Influenza B by PCR NEGATIVE NEGATIVE Final    Comment: (NOTE) The Xpert Xpress SARS-CoV-2/FLU/RSV plus assay is intended as an aid in the diagnosis of influenza from Nasopharyngeal swab specimens and should not be used as Dashon Mcintire sole basis for treatment. Nasal washings and aspirates are unacceptable for Xpert Xpress SARS-CoV-2/FLU/RSV testing.  Fact Sheet for Patients: EntrepreneurPulse.com.au  Fact Sheet for Healthcare Providers: IncredibleEmployment.be  This test is not yet approved or cleared by the [place] FDA and has been authorized for detection  and/or diagnosis of SARS-CoV-2 by FDA under an Emergency Use Authorization (EUA). This EUA will remain in effect (meaning this test can be used) for the duration of the COVID-19 declaration under Section 564(b)(1) of the Act, 21 U.S.C. section 360bbb-3(b)(1), unless the authorization is terminated or revoked.  Performed at [facility], [address] [place],  [address]          Radiology Studies: No results found.      Scheduled Meds:  ARIPiprazole  5 mg Oral QHS   atorvastatin  20 mg Oral Daily   buPROPion  300 mg Oral q morning   busPIRone  15 mg Oral BID   enoxaparin (LOVENOX) injection  65 mg Subcutaneous Q24H   hydrOXYzine  50 mg Oral q AM   lisinopril  5 mg Oral Daily   metoCLOPramide (REGLAN) injection  10 mg Intravenous Q6H    montelukast  10 mg Oral Daily   traZODone  50 mg Oral QHS   venlafaxine XR  150 mg Oral Q breakfast   Continuous Infusions:  lactated ringers 100 mL/hr at 04/27/21 0939     LOS: 0 days    Time spent: over 30 min    Fayrene Helper, MD [facility]   To contact the attending provider between 7A-7P or the covering provider during after hours 7P-7A, please log into the web site [email or web address] and access using universal [place] password for that web site. If you do not have the password, please call the hospital operator.  04/27/2021, 2:30 PM

## 2021-04-28 DIAGNOSIS — R066 Hiccough: Secondary | ICD-10-CM

## 2021-04-28 LAB — CBC WITH DIFFERENTIAL/PLATELET
Abs Immature Granulocytes: 0.01 10*3/uL (ref 0.00–0.07)
Basophils Absolute: 0 10*3/uL (ref 0.0–0.1)
Basophils Relative: 1 %
Eosinophils Absolute: 0 10*3/uL (ref 0.0–0.5)
Eosinophils Relative: 1 %
HCT: 44.2 % (ref 36.0–46.0)
Hemoglobin: 14.8 g/dL (ref 12.0–15.0)
Immature Granulocytes: 0 %
Lymphocytes Relative: 27 %
Lymphs Abs: 1.6 10*3/uL (ref 0.7–4.0)
MCH: 30.2 pg (ref 26.0–34.0)
MCHC: 33.5 g/dL (ref 30.0–36.0)
MCV: 90.2 fL (ref 80.0–100.0)
Monocytes Absolute: 0.6 10*3/uL (ref 0.1–1.0)
Monocytes Relative: 10 %
Neutro Abs: 3.7 10*3/uL (ref 1.7–7.7)
Neutrophils Relative %: 61 %
Platelets: 232 10*3/uL (ref 150–400)
RBC: 4.9 MIL/uL (ref 3.87–5.11)
RDW: 13 % (ref 11.5–15.5)
WBC: 6 10*3/uL (ref 4.0–10.5)
nRBC: 0 % (ref 0.0–0.2)

## 2021-04-28 LAB — COMPREHENSIVE METABOLIC PANEL
ALT: 14 U/L (ref 0–44)
AST: 17 U/L (ref 15–41)
Albumin: 3.7 g/dL (ref 3.5–5.0)
Alkaline Phosphatase: 72 U/L (ref 38–126)
Anion gap: 8 (ref 5–15)
BUN: 11 mg/dL (ref 6–20)
CO2: 28 mmol/L (ref 22–32)
Calcium: 8.7 mg/dL — ABNORMAL LOW (ref 8.9–10.3)
Chloride: 100 mmol/L (ref 98–111)
Creatinine, Ser: 0.95 mg/dL (ref 0.44–1.00)
GFR, Estimated: >60 mL/min (ref >60)
Glucose, Bld: 104 mg/dL — ABNORMAL HIGH (ref 70–99)
Potassium: 3 mmol/L — ABNORMAL LOW (ref 3.5–5.1)
Sodium: 136 mmol/L (ref 135–145)
Total Bilirubin: 0.7 mg/dL (ref 0.3–1.2)
Total Protein: 6.6 g/dL (ref 6.5–8.1)

## 2021-04-28 LAB — MAGNESIUM: Magnesium: 2 mg/dL (ref 1.7–2.4)

## 2021-04-28 LAB — PHOSPHORUS: Phosphorus: 2.9 mg/dL (ref 2.5–4.6)

## 2021-04-28 MED ORDER — GABAPENTIN 100 MG PO CAPS
100.0000 mg | ORAL_CAPSULE | Freq: Every day | ORAL | Status: DC
  Administered 2021-04-28: 100 mg via ORAL
  Filled 2021-04-28: qty 1

## 2021-04-28 MED ORDER — LIDOCAINE VISCOUS HCL 2 % MT SOLN
15.0000 mL | Freq: Once | OROMUCOSAL | Status: DC | PRN
  Filled 2021-04-28: qty 15

## 2021-04-28 MED ORDER — ALUM & MAG HYDROXIDE-SIMETH 200-200-20 MG/5ML PO SUSP: 30.0000 mL | Freq: Once | ORAL | Status: DC | PRN

## 2021-04-28 MED ORDER — PANTOPRAZOLE SODIUM 40 MG IV SOLR
40.0000 mg | Freq: Every day | INTRAVENOUS | Status: DC
  Administered 2021-04-28: 40 mg via INTRAVENOUS

## 2021-04-28 MED ORDER — POTASSIUM CHLORIDE CRYS ER 20 MEQ PO TBCR
40.0000 meq | EXTENDED_RELEASE_TABLET | ORAL | Status: AC
  Administered 2021-04-28 (×2): 40 meq via ORAL
  Filled 2021-04-28 (×2): qty 2

## 2021-04-28 MED ORDER — GABAPENTIN 100 MG PO CAPS
100.0000 mg | ORAL_CAPSULE | Freq: Three times a day (TID) | ORAL | Status: DC | PRN
  Administered 2021-04-28 (×2): 100 mg via ORAL
  Filled 2021-04-28 (×2): qty 1

## 2021-04-28 NOTE — Assessment & Plan Note (Addendum)
improved

## 2021-04-28 NOTE — Progress Notes (Signed)
PROGRESS NOTE    Monique Shaw  [id number] DOB: 1980-06-01 DOA: 04/25/2021 PCP: Hermine Messick, MD  Chief Complaint  Patient presents with   Emesis    Brief Narrative:  Monique Shaw is Monique Shaw 41 y.o. female with medical history significant for asthma, hypertension, hyperlipidemia, anxiety, GERD, IBS, migraines, ADHD, morbid obesity, sleep apnea who is admitted with intractable nausea and vomiting.  CT abdomen/pelvis negative for acute etiology.  Placed on scheduled and as needed antiemetics.    Assessment & Plan:   Principal Problem:   Intractable nausea and vomiting Active Problems:   Pyuria   Hiccups   Anxiety   Essential hypertension   Hyperlipidemia   OSA (obstructive sleep apnea)   Asthma   Nausea & vomiting   Assessment and Plan: * Intractable nausea and vomiting- (present on admission) Reports remote history of past gastric dumping syndrome without prior history of gastric bypass.  Has early satiety with negative gastric emptying study July 2022.  Acute symptoms may be related to up titration of Ozempic?  Half life is 1 week, so symptoms may linger.   CT abdomen/pelvis negative for acute etiology Continue clears for now, ADAT Recurrent nausea/vomiting today Continue compazine, reglan increase to q6 If symptoms continue, will need to consider GI consult and/or repeating CT scan   Hiccups Trial gabapentin  Pyuria Follow urine cx - multiple species Follow for sx  Essential hypertension- (present on admission) Continue lisinopril.  Anxiety- (present on admission) Resume home Abilify, bupropion, BuSpar, hydroxyzine, Xanax as needed.  Hyperlipidemia- (present on admission) Continue atorvastatin.  OSA (obstructive sleep apnea)- (present on admission) Patient states she is scheduled for sleep study in March.  Not currently using CPAP.  Asthma- (present on admission) Stable without wheezing.  Continue albuterol as needed and  Singulair.   DVT prophylaxis: lovenox Code Status: full Family Communication: none  Disposition:   Status is: Observation The patient will require care spanning > 2 midnights and should be moved to inpatient because: continued nausea, inability to tolerate PO, need for IVF  Consultants:  none  Procedures:  none  Antimicrobials:  Anti-infectives (From admission, onward)    None      Subjective: Again, recurrent emesis  Objective: Vitals:   04/27/21 2110 04/28/21 0545 04/28/21 0921 04/28/21 1523  BP: 135/80 (!) 140/91 129/87 140/77  Pulse: 72 66 81 73  Resp: 17 17 18 16   Temp: 97.8 F (36.6 C) 98.4 F (36.9 C)  98.5 F (36.9 C)  TempSrc: Oral Oral  Oral  SpO2: 95% 98% 98% 95%  Weight:      Height:        Intake/Output Summary (Last 24 hours) at 04/28/2021 1747 Last data filed at 04/28/2021 1100 Gross per 24 hour  Intake 100 ml  Output --  Net 100 ml   Filed Weights   04/25/21 0134  Weight: 131.5 kg    Examination:  General: No acute distress. Cardiovascular: RRR Lungs: unlabored Abdomen: Soft, nontender, nondistended  Neurological: Alert and oriented 3. Moves all extremities 4. Cranial nerves II through XII grossly intact. Skin: Warm and dry. No rashes or lesions. Extremities: No clubbing or cyanosis. No edema.    Data Reviewed: I have personally reviewed following labs and imaging studies  CBC: Recent Labs  Lab 04/25/21 0144 04/26/21 0941 04/27/21 0508 04/28/21 0548  WBC 7.9 11.1* 6.3 6.0  NEUTROABS  --   --  4.2 3.7  HGB 15.0 15.1* 14.3 14.8  HCT 44.3 44.5 43.3  44.2  MCV 89.0 89.0 91.0 90.2  PLT 249 237 219 [id number]    Basic Metabolic Panel: Recent Labs  Lab 04/25/21 0144 04/26/21 0941 04/27/21 0508 04/28/21 0548  NA 136 134* 136 136  K 4.1 4.6 3.5 3.0*  CL 104 105 102 100  CO2 22 21* 26 28  GLUCOSE 117* 106* 101* 104*  BUN 23* 20 16 11   CREATININE 0.99 0.98 0.85 0.95  CALCIUM 9.2 8.6* 8.6* 8.7*  MG  --   --  2.1 2.0  PHOS   --   --  2.5 2.9    GFR: Estimated Creatinine Clearance: 113.6 mL/min (by C-G formula based on SCr of 0.95 mg/dL).  Liver Function Tests: Recent Labs  Lab 04/25/21 0144 04/27/21 0508 04/28/21 0548  AST 18 16 17   ALT 15 12 14   ALKPHOS 76 68 72  BILITOT 0.8 0.6 0.7  PROT 7.6 6.7 6.6  ALBUMIN 4.1 3.8 3.7    CBG: No results for input(s): GLUCAP in the last 168 hours.   Recent Results (from the past 240 hour(s))  Urine Culture     Status: Abnormal   Collection Time: 04/25/21  1:44 AM   Specimen: Urine, Clean Catch  Result Value Ref Range Status   Specimen Description   Final    URINE, CLEAN CATCH Performed at [facility], [address] [place], [place] [address]    Special Requests   Final    NONE Performed at [facility], [address] [place], [place] [address]    Culture MULTIPLE SPECIES PRESENT, SUGGEST RECOLLECTION (Jakara Blatter)  Final   Report Status 04/26/2021 FINAL  Final  Resp Panel by RT-PCR (Flu Laith Antonelli&B, Covid) Nasopharyngeal Swab     Status: None   Collection Time: 04/25/21  8:10 AM   Specimen: Nasopharyngeal Swab; Nasopharyngeal(NP) swabs in vial transport medium  Result Value Ref Range Status   SARS Coronavirus 2 by RT PCR NEGATIVE NEGATIVE Final    Comment: (NOTE) SARS-CoV-2 target nucleic acids are NOT DETECTED.  The SARS-CoV-2 RNA is generally detectable in upper respiratory specimens during the acute phase of infection. The lowest concentration of SARS-CoV-2 viral copies this assay can detect is 138 copies/mL. Sebron Mcmahill negative result does not preclude SARS-Cov-2 infection and should not be used as the sole basis for treatment or other patient management decisions. Baudelia Schroepfer negative result may occur with  improper specimen collection/handling, submission of specimen other than nasopharyngeal swab, presence of viral mutation(s) within the areas targeted by this assay, and inadequate number of viral copies(<138 copies/mL). Courtland Coppa negative result must be  combined with clinical observations, patient history, and epidemiological information. The expected result is Negative.  Fact Sheet for Patients:  EntrepreneurPulse.com.au  Fact Sheet for Healthcare Providers:  IncredibleEmployment.be  This test is no t yet approved or cleared by the [place] FDA and  has been authorized for detection and/or diagnosis of SARS-CoV-2 by FDA under an Emergency Use Authorization (EUA). This EUA will remain  in effect (meaning this test can be used) for the duration of the COVID-19 declaration under Section 564(b)(1) of the Act, 21 U.S.C.section 360bbb-3(b)(1), unless the authorization is terminated  or revoked sooner.       Influenza Elisa Kutner by PCR NEGATIVE NEGATIVE Final   Influenza B by PCR NEGATIVE NEGATIVE Final    Comment: (NOTE) The Xpert Xpress SARS-CoV-2/FLU/RSV plus assay is intended as an aid in the diagnosis of influenza from Nasopharyngeal swab specimens and should not be used  as Vanilla Heatherington sole basis for treatment. Nasal washings and aspirates are unacceptable for Xpert Xpress SARS-CoV-2/FLU/RSV testing.  Fact Sheet for Patients: EntrepreneurPulse.com.au  Fact Sheet for Healthcare Providers: IncredibleEmployment.be  This test is not yet approved or cleared by the [place] FDA and has been authorized for detection and/or diagnosis of SARS-CoV-2 by FDA under an Emergency Use Authorization (EUA). This EUA will remain in effect (meaning this test can be used) for the duration of the COVID-19 declaration under Section 564(b)(1) of the Act, 21 U.S.C. section 360bbb-3(b)(1), unless the authorization is terminated or revoked.  Performed at [facility], [address] [place], [place] [address]          Radiology Studies: No results found.      Scheduled Meds:  ARIPiprazole  5 mg Oral QHS   atorvastatin  20 mg Oral Daily   buPROPion  300 mg Oral  q morning   busPIRone  15 mg Oral BID   enoxaparin (LOVENOX) injection  65 mg Subcutaneous Q24H   gabapentin  100 mg Oral QHS   hydrOXYzine  50 mg Oral q AM   lisinopril  5 mg Oral Daily   metoCLOPramide (REGLAN) injection  10 mg Intravenous Q6H   montelukast  10 mg Oral Daily   pantoprazole (PROTONIX) IV  40 mg Intravenous QHS   traZODone  50 mg Oral QHS   venlafaxine XR  150 mg Oral Q breakfast   Continuous Infusions:     LOS: 1 day    Time spent: over 30 min    Fayrene Helper, MD [facility]   To contact the attending provider between 7A-7P or the covering provider during after hours 7P-7A, please log into the web site [email or web address] and access using universal [place] password for that web site. If you do not have the password, please call the hospital operator.  04/28/2021, 5:47 PM

## 2021-04-29 DIAGNOSIS — E876 Hypokalemia: Secondary | ICD-10-CM

## 2021-04-29 MED ORDER — METOCLOPRAMIDE HCL 5 MG PO TABS: 5.0000 mg | ORAL_TABLET | Freq: Four times a day (QID) | ORAL | 0 refills | Status: DC | PRN

## 2021-04-29 MED ORDER — POTASSIUM CHLORIDE CRYS ER 20 MEQ PO TBCR
40.0000 meq | EXTENDED_RELEASE_TABLET | ORAL | Status: AC
  Administered 2021-04-29 (×2): 40 meq via ORAL
  Filled 2021-04-29 (×2): qty 2

## 2021-04-29 MED ORDER — POTASSIUM CHLORIDE 20 MEQ PO PACK
20.0000 meq | PACK | Freq: Once | ORAL | Status: DC
  Filled 2021-04-29: qty 1

## 2021-04-29 MED ORDER — POTASSIUM CHLORIDE 10 MEQ/100ML IV SOLN
10.0000 meq | INTRAVENOUS | Status: DC
  Administered 2021-04-29: 10 meq via INTRAVENOUS
  Filled 2021-04-29 (×4): qty 100

## 2021-04-29 MED ORDER — PROCHLORPERAZINE MALEATE 5 MG PO TABS: 10.0000 mg | ORAL_TABLET | Freq: Three times a day (TID) | ORAL | 0 refills | Status: DC | PRN

## 2021-04-29 NOTE — Discharge Summary (Signed)
Physician Discharge Summary  Monique Shaw [id number] DOB: 14-Aug-1980 DOA: 04/25/2021  PCP: Monique Messick, MD  Admit date: 04/25/2021 Discharge date: 04/29/2021  Time spent: 40 minutes  Recommendations for Outpatient Follow-up:  Follow outpatient CBC/CMP Follow nausea/vomiting outpatient Follow ozempic use outpatient - discontinued, discuss with PCP whether to resume    Discharge Diagnoses:  Principal Problem:   Intractable nausea and vomiting Active Problems:   Pyuria   Hiccups   Anxiety   Essential hypertension   Hyperlipidemia   OSA (obstructive sleep apnea)   Asthma   Hypokalemia   Nausea & vomiting   Discharge Condition: stable  Diet recommendation: heart healthy  Filed Weights   04/25/21 0134  Weight: 131.5 kg    History of present illness:  Monique Shaw Monique Shaw is Monique Shaw 41 y.o. female with medical history significant for asthma, hypertension, hyperlipidemia, anxiety, GERD, IBS, migraines, ADHD, morbid obesity, sleep apnea who is admitted with intractable nausea and vomiting.  CT abdomen/pelvis negative for acute etiology.  Placed on scheduled and as needed antiemetics.  Symptoms were thought to be related to her uptitration of ozempic.  Her symptoms were prolonged, but have finally improved.  She'll discharge today with plan for outpatient follow up.  See below for additional details  Hospital Course:  Assessment and Plan: * Intractable nausea and vomiting- (present on admission) Reports remote history of past gastric dumping syndrome without prior history of gastric bypass.  Has early satiety with negative gastric emptying study July 2022.  Acute symptoms may be related to up titration of Ozempic?  Half life is 1 week, so symptoms may linger.   CT abdomen/pelvis negative for acute etiology She's tolerating soft diet today prior to discharge Will discharge with reglan and compazine, short courses to use as  needed   Hiccups improved  Pyuria Follow urine cx - multiple species Follow for sx  Essential hypertension- (present on admission) Continue lisinopril.  Anxiety- (present on admission) Resume home Abilify, bupropion, BuSpar, hydroxyzine, Xanax as needed.  Hyperlipidemia- (present on admission) Continue atorvastatin.  OSA (obstructive sleep apnea)- (present on admission) Patient states she is scheduled for sleep study in March.  Not currently using CPAP.  Asthma- (present on admission) Stable without wheezing.  Continue albuterol as needed and Singulair.  Hypokalemia replaced   Procedures: none   Consultations: none  Discharge Exam: Vitals:   04/29/21 0604 04/29/21 1338  BP: 93/66 (!) 94/53  Pulse: 92 97  Resp:  14  Temp: 97.7 F (36.5 C) 97.9 F (36.6 C)  SpO2: 94% 96%   No concerns Feeling better today Eager to dischage if she tolerates PO today  General: No acute distress. Cardiovascular: RRR Lungs: unlabored Abdomen: Soft, nontender, nondistended  Neurological: Alert and oriented 3. Moves all extremities 4 . Cranial nerves II through XII grossly intact. Skin: Warm and dry. No rashes or lesions. Extremities: No clubbing or cyanosis. No edema. Discharge Instructions   Discharge Instructions     Call MD for:  difficulty breathing, headache or visual disturbances   Complete by: As directed    Call MD for:  extreme fatigue   Complete by: As directed    Call MD for:  hives   Complete by: As directed    Call MD for:  persistant dizziness or light-headedness   Complete by: As directed    Call MD for:  persistant nausea and vomiting   Complete by: As directed    Call MD for:  redness, tenderness, or signs of  infection (pain, swelling, redness, odor or green/yellow discharge around incision site)   Complete by: As directed    Call MD for:  severe uncontrolled pain   Complete by: As directed    Call MD for:  temperature >100.4   Complete by: As  directed    Diet - low sodium heart healthy   Complete by: As directed    Discharge instructions   Complete by: As directed    You were seen for intractable nausea and vomiting.   You've improved with reglan and compazine.  I think this was most likely related to your ozempic.  Stop this and follow up with your prescribing physician to discuss whether you need to stop this long term or not.  I'll send you home with Monique Shaw couple days of reglan as needed as well as Monique Shaw couple days of compazine to use as needed for nausea/vomiting.  Return for new, recurrent, or worsening symptoms.  Please ask your PCP to request records from this hospitalization so they know what was done and what the next steps will be.   Increase activity slowly   Complete by: As directed       Allergies as of 04/29/2021   No Known Allergies      Medication List     STOP taking these medications    ondansetron 4 MG disintegrating tablet Commonly known as: ZOFRAN-ODT   Ozempic (2 MG/DOSE) 8 MG/3ML Sopn Generic drug: Semaglutide (2 MG/DOSE)   promethazine 25 MG suppository Commonly known as: PHENERGAN       TAKE these medications    albuterol 108 (90 Base) MCG/ACT inhaler Commonly known as: VENTOLIN HFA 2 puffs every 4 (four) hours as needed for wheezing or shortness of breath.   ALPRAZolam 1 MG tablet Commonly known as: XANAX Take 1 mg by mouth 4 (four) times daily as needed for anxiety.   ARIPiprazole 5 MG tablet Commonly known as: ABILIFY Take 5 mg by mouth at bedtime.   atorvastatin 20 MG tablet Commonly known as: LIPITOR Take 20 mg by mouth daily.   azelastine 0.05 % ophthalmic solution Commonly known as: OPTIVAR Place 1 drop into both eyes daily.   Azelastine HCl 137 MCG/SPRAY Soln SMARTSIG:1-2 Spray(s) Both Nares Twice Daily   buPROPion 300 MG 24 hr tablet Commonly known as: WELLBUTRIN XL Take 300 mg by mouth every morning.   busPIRone 15 MG tablet Commonly known as: BUSPAR Take  15 mg by mouth 2 (two) times daily.   desvenlafaxine 100 MG 24 hr tablet Commonly known as: PRISTIQ Take 100 mg by mouth daily.   famotidine 40 MG tablet Commonly known as: PEPCID Take 40 mg by mouth daily.   hydrOXYzine 25 MG tablet Commonly known as: ATARAX Take 50 mg by mouth in the morning.   lisinopril 5 MG tablet Commonly known as: ZESTRIL Take 5 mg by mouth daily.   methocarbamol 500 MG tablet Commonly known as: ROBAXIN Take 1 tablet (500 mg total) by mouth 2 (two) times daily.   metoCLOPramide 5 MG tablet Commonly known as: Reglan Take 1 tablet (5 mg total) by mouth every 6 (six) hours as needed for up to 3 days for refractory nausea / vomiting (use for nausea/vomiting not improved with compazine).   montelukast 10 MG tablet Commonly known as: SINGULAIR Take 10 mg by mouth daily.   pantoprazole 40 MG tablet Commonly known as: PROTONIX Take 40 mg by mouth daily.   prochlorperazine 5 MG tablet Commonly known as: COMPAZINE Take  2 tablets (10 mg total) by mouth every 8 (eight) hours as needed for up to 5 days for nausea or vomiting.   Qelbree 200 MG 24 hr capsule Generic drug: viloxazine ER Take 200 mg by mouth daily.   SUMAtriptan 20 MG/ACT nasal spray Commonly known as: Imitrex Place 1 spray (20 mg total) into the nose every 2 (two) hours as needed for migraine or headache. May repeat in 2 hours if headache persists or recurs.   SUMAtriptan 50 MG tablet Commonly known as: IMITREX Take 1 tablet (50 mg total) by mouth once. May repeat in 2 hours if needed. No more than 2 per 24 hours. No more than 3 times per week.   SUMAtriptan 100 MG tablet Commonly known as: IMITREX Take 100 mg by mouth every 2 (two) hours as needed for migraine or headache.   traZODone 50 MG tablet Commonly known as: DESYREL Take 50 mg by mouth at bedtime.   Vitamin D3 1.25 MG (50000 UT) Caps Take 1 capsule by mouth once Rieley Khalsa week.       No Known Allergies    The results of  significant diagnostics from this hospitalization (including imaging, microbiology, ancillary and laboratory) are listed below for reference.    Significant Diagnostic Studies: CT ABDOMEN PELVIS W CONTRAST  Result Date: 04/25/2021 CLINICAL DATA:  Abdominal pain. EXAM: CT ABDOMEN AND PELVIS WITH CONTRAST TECHNIQUE: Multidetector CT imaging of the abdomen and pelvis was performed using the standard protocol following bolus administration of intravenous contrast. RADIATION DOSE REDUCTION: This exam was performed according to the departmental dose-optimization program which includes automated exposure control, adjustment of the mA and/or kV according to patient size and/or use of iterative reconstruction technique. CONTRAST:  124mL OMNIPAQUE IOHEXOL 300 MG/ML  SOLN COMPARISON:  None. FINDINGS: Lower chest: No acute abnormality. Hepatobiliary: No focal liver abnormality is seen. Low-attenuation of the liver as can be seen with hepatic steatosis. No gallstones, gallbladder wall thickening, or biliary dilatation. Pancreas: Unremarkable. No pancreatic ductal dilatation or surrounding inflammatory changes. Spleen: Normal in size without focal abnormality. Adrenals/Urinary Tract: Adrenal glands are unremarkable. Kidneys are normal, without renal calculi, focal lesion, or hydronephrosis. Bladder is unremarkable. Stomach/Bowel: Stomach is within normal limits. No evidence of bowel wall thickening, distention, or inflammatory changes. Appendix is normal. Vascular/Lymphatic: No significant vascular findings are present. No enlarged abdominal or pelvic lymph nodes. Reproductive: Uterus and bilateral adnexa are unremarkable. T-type IUD in the uterus. Other: No abdominal wall hernia or abnormality. No abdominopelvic ascites. Musculoskeletal: No acute osseous abnormality. No aggressive osseous lesion. Mild osteoarthritis of bilateral SI joints. Degenerative disease with severe disc height loss at L5-S1. Minimal retrolisthesis of  L5 on S1. Bilateral foraminal stenosis at L5-S1. Stimulator power pack along the left flank with the lead terminating in the medial aspect of the left gluteus maximus muscle. IMPRESSION: 1. No acute abdominal or pelvic pathology. 2. Hepatic steatosis. Electronically Signed   By: Kathreen Devoid M.D.   On: 04/25/2021 08:55    Microbiology: Recent Results (from the past 240 hour(s))  Urine Culture     Status: Abnormal   Collection Time: 04/25/21  1:44 AM   Specimen: Urine, Clean Catch  Result Value Ref Range Status   Specimen Description   Final    URINE, CLEAN CATCH Performed at [facility], [address] [place], [place] [address]    Special Requests   Final    NONE Performed at [facility], [address] [place]  [place],  [address]    Culture MULTIPLE SPECIES PRESENT, SUGGEST RECOLLECTION (Kamila Broda)  Final   Report Status 04/26/2021 FINAL  Final  Resp Panel by RT-PCR (Flu Nicholl Onstott&B, Covid) Nasopharyngeal Swab     Status: None   Collection Time: 04/25/21  8:10 AM   Specimen: Nasopharyngeal Swab; Nasopharyngeal(NP) swabs in vial transport medium  Result Value Ref Range Status   SARS Coronavirus 2 by RT PCR NEGATIVE NEGATIVE Final    Comment: (NOTE) SARS-CoV-2 target nucleic acids are NOT DETECTED.  The SARS-CoV-2 RNA is generally detectable in upper respiratory specimens during the acute phase of infection. The lowest concentration of SARS-CoV-2 viral copies this assay can detect is 138 copies/mL. Marrietta Thunder negative result does not preclude SARS-Cov-2 infection and should not be used as the sole basis for treatment or other patient management decisions. Erion Weightman negative result may occur with  improper specimen collection/handling, submission of specimen other than nasopharyngeal swab, presence of viral mutation(s) within the areas targeted by this assay, and inadequate number of viral copies(<138 copies/mL). Magdala Brahmbhatt negative result must be combined with clinical observations, patient  history, and epidemiological information. The expected result is Negative.  Fact Sheet for Patients:  EntrepreneurPulse.com.au  Fact Sheet for Healthcare Providers:  IncredibleEmployment.be  This test is no t yet approved or cleared by the [place] FDA and  has been authorized for detection and/or diagnosis of SARS-CoV-2 by FDA under an Emergency Use Authorization (EUA). This EUA will remain  in effect (meaning this test can be used) for the duration of the COVID-19 declaration under Section 564(b)(1) of the Act, 21 U.S.C.section 360bbb-3(b)(1), unless the authorization is terminated  or revoked sooner.       Influenza Dustie Brittle by PCR NEGATIVE NEGATIVE Final   Influenza B by PCR NEGATIVE NEGATIVE Final    Comment: (NOTE) The Xpert Xpress SARS-CoV-2/FLU/RSV plus assay is intended as an aid in the diagnosis of influenza from Nasopharyngeal swab specimens and should not be used as Jahrel Borthwick sole basis for treatment. Nasal washings and aspirates are unacceptable for Xpert Xpress SARS-CoV-2/FLU/RSV testing.  Fact Sheet for Patients: EntrepreneurPulse.com.au  Fact Sheet for Healthcare Providers: IncredibleEmployment.be  This test is not yet approved or cleared by the [place] FDA and has been authorized for detection and/or diagnosis of SARS-CoV-2 by FDA under an Emergency Use Authorization (EUA). This EUA will remain in effect (meaning this test can be used) for the duration of the COVID-19 declaration under Section 564(b)(1) of the Act, 21 U.S.C. section 360bbb-3(b)(1), unless the authorization is terminated or revoked.  Performed at [facility], [address] [place], [place] [address]      Labs: Basic Metabolic Panel: Recent Labs  Lab 04/25/21 0144 04/26/21 0941 04/27/21 0508 04/28/21 0548  NA 136 134* 136 136  K 4.1 4.6 3.5 3.0*  CL 104 105 102 100  CO2 22 21* 26 28  GLUCOSE 117*  106* 101* 104*  BUN 23* 20 16 11   CREATININE 0.99 0.98 0.85 0.95  CALCIUM 9.2 8.6* 8.6* 8.7*  MG  --   --  2.1 2.0  PHOS  --   --  2.5 2.9   Liver Function Tests: Recent Labs  Lab 04/25/21 0144 04/27/21 0508 04/28/21 0548  AST 18 16 17   ALT 15 12 14   ALKPHOS 76 68 72  BILITOT 0.8 0.6 0.7  PROT 7.6 6.7 6.6  ALBUMIN 4.1 3.8 3.7   Recent Labs  Lab 04/25/21 0144  LIPASE 27   No results for  input(s): AMMONIA in the last 168 hours. CBC: Recent Labs  Lab 04/25/21 0144 04/26/21 0941 04/27/21 0508 04/28/21 0548  WBC 7.9 11.1* 6.3 6.0  NEUTROABS  --   --  4.2 3.7  HGB 15.0 15.1* 14.3 14.8  HCT 44.3 44.5 43.3 44.2  MCV 89.0 89.0 91.0 90.2  PLT 249 237 219 232   Cardiac Enzymes: No results for input(s): CKTOTAL, CKMB, CKMBINDEX, TROPONINI in the last 168 hours. BNP: BNP (last 3 results) No results for input(s): BNP in the last 8760 hours.  ProBNP (last 3 results) No results for input(s): PROBNP in the last 8760 hours.  CBG: No results for input(s): GLUCAP in the last 168 hours.     Signed:  Fayrene Helper MD.  [facility] 04/29/2021, 4:24 PM

## 2021-04-29 NOTE — Assessment & Plan Note (Signed)
replaced

## 2021-05-02 ENCOUNTER — Emergency Department
Admission: EM | Admit: 2021-05-02 | Discharge: 2021-05-03 | Discharge status: Against Medical Advice | Payer: BC Managed Care – PPO | Attending: Emergency Medicine | Admitting: Emergency Medicine

## 2021-05-02 ENCOUNTER — Other Ambulatory Visit: Payer: Self-pay

## 2021-05-02 DIAGNOSIS — R079 Chest pain, unspecified: Secondary | ICD-10-CM | POA: Insufficient documentation

## 2021-05-02 DIAGNOSIS — Z5321 Procedure and treatment not carried out due to patient leaving prior to being seen by health care provider: Secondary | ICD-10-CM | POA: Insufficient documentation

## 2021-05-02 LAB — COMPREHENSIVE METABOLIC PANEL
ALT: 18 U/L (ref 0–44)
AST: 18 U/L (ref 15–41)
Albumin: 4.1 g/dL (ref 3.5–5.0)
Alkaline Phosphatase: 86 U/L (ref 38–126)
Anion gap: 9 (ref 5–15)
BUN: 16 mg/dL (ref 6–20)
CO2: 23 mmol/L (ref 22–32)
Calcium: 9.1 mg/dL (ref 8.9–10.3)
Chloride: 101 mmol/L (ref 98–111)
Creatinine, Ser: 1.16 mg/dL — ABNORMAL HIGH (ref 0.44–1.00)
GFR, Estimated: >60 mL/min (ref >60)
Glucose, Bld: 143 mg/dL — ABNORMAL HIGH (ref 70–99)
Potassium: 3.5 mmol/L (ref 3.5–5.1)
Sodium: 133 mmol/L — ABNORMAL LOW (ref 135–145)
Total Bilirubin: 0.7 mg/dL (ref 0.3–1.2)
Total Protein: 7.1 g/dL (ref 6.5–8.1)

## 2021-05-02 LAB — CBC
HCT: 44.8 % (ref 36.0–46.0)
Hemoglobin: 14.8 g/dL (ref 12.0–15.0)
MCH: 29.4 pg (ref 26.0–34.0)
MCHC: 33 g/dL (ref 30.0–36.0)
MCV: 89.1 fL (ref 80.0–100.0)
Platelets: 277 10*3/uL (ref 150–400)
RBC: 5.03 MIL/uL (ref 3.87–5.11)
RDW: 13.4 % (ref 11.5–15.5)
WBC: 8.2 10*3/uL (ref 4.0–10.5)
nRBC: 0 % (ref 0.0–0.2)

## 2021-05-02 LAB — TROPONIN I (HIGH SENSITIVITY): Troponin I (High Sensitivity): 6 ng/L (ref <18)

## 2021-05-02 LAB — LIPASE, BLOOD: Lipase: 37 U/L (ref 11–51)

## 2021-05-02 NOTE — ED Triage Notes (Signed)
Pt presents to ER c/o n/v and palpitations that started this afternoon.  Pt states she was just discharged from [facility] on Saturday night with intractable n/v and states she has vomited appx 12 times since 1730 today.  Pt A&O x4 at this time.  NAD noted in triage.

## 2021-05-03 ENCOUNTER — Encounter (HOSPITAL_COMMUNITY): Payer: Self-pay

## 2021-05-03 ENCOUNTER — Observation Stay (HOSPITAL_COMMUNITY)
Admission: EM | Admit: 2021-05-03 | Discharge: 2021-05-05 | Disposition: A | Discharge status: Home / Self Care | Payer: BC Managed Care – PPO | Attending: Family Medicine | Admitting: Family Medicine

## 2021-05-03 DIAGNOSIS — Z6841 Body Mass Index (BMI) 40.0 and over, adult: Secondary | ICD-10-CM

## 2021-05-03 DIAGNOSIS — B181 Chronic viral hepatitis B without delta-agent: Secondary | ICD-10-CM | POA: Diagnosis present

## 2021-05-03 DIAGNOSIS — G4733 Obstructive sleep apnea (adult) (pediatric): Secondary | ICD-10-CM | POA: Diagnosis present

## 2021-05-03 DIAGNOSIS — F909 Attention-deficit hyperactivity disorder, unspecified type: Secondary | ICD-10-CM | POA: Diagnosis present

## 2021-05-03 DIAGNOSIS — Z20822 Contact with and (suspected) exposure to covid-19: Secondary | ICD-10-CM | POA: Diagnosis not present

## 2021-05-03 DIAGNOSIS — F419 Anxiety disorder, unspecified: Secondary | ICD-10-CM | POA: Diagnosis present

## 2021-05-03 DIAGNOSIS — G43909 Migraine, unspecified, not intractable, without status migrainosus: Secondary | ICD-10-CM | POA: Diagnosis present

## 2021-05-03 DIAGNOSIS — K219 Gastro-esophageal reflux disease without esophagitis: Secondary | ICD-10-CM | POA: Diagnosis present

## 2021-05-03 DIAGNOSIS — K760 Fatty (change of) liver, not elsewhere classified: Secondary | ICD-10-CM | POA: Diagnosis present

## 2021-05-03 DIAGNOSIS — R739 Hyperglycemia, unspecified: Secondary | ICD-10-CM

## 2021-05-03 DIAGNOSIS — Z8249 Family history of ischemic heart disease and other diseases of the circulatory system: Secondary | ICD-10-CM

## 2021-05-03 DIAGNOSIS — Z79899 Other long term (current) drug therapy: Secondary | ICD-10-CM | POA: Diagnosis not present

## 2021-05-03 DIAGNOSIS — E66813 Obesity, class 3: Secondary | ICD-10-CM

## 2021-05-03 DIAGNOSIS — E785 Hyperlipidemia, unspecified: Secondary | ICD-10-CM | POA: Diagnosis present

## 2021-05-03 DIAGNOSIS — J45909 Unspecified asthma, uncomplicated: Secondary | ICD-10-CM | POA: Diagnosis not present

## 2021-05-03 DIAGNOSIS — K2960 Other gastritis without bleeding: Secondary | ICD-10-CM | POA: Diagnosis not present

## 2021-05-03 DIAGNOSIS — R112 Nausea with vomiting, unspecified: Secondary | ICD-10-CM | POA: Diagnosis present

## 2021-05-03 DIAGNOSIS — K2970 Gastritis, unspecified, without bleeding: Principal | ICD-10-CM | POA: Insufficient documentation

## 2021-05-03 DIAGNOSIS — K580 Irritable bowel syndrome with diarrhea: Secondary | ICD-10-CM | POA: Diagnosis present

## 2021-05-03 DIAGNOSIS — I10 Essential (primary) hypertension: Secondary | ICD-10-CM | POA: Diagnosis present

## 2021-05-03 DIAGNOSIS — R159 Full incontinence of feces: Secondary | ICD-10-CM | POA: Diagnosis present

## 2021-05-03 DIAGNOSIS — E876 Hypokalemia: Secondary | ICD-10-CM | POA: Diagnosis present

## 2021-05-03 LAB — RAPID URINE DRUG SCREEN, HOSP PERFORMED
Amphetamines: NOT DETECTED
Barbiturates: NOT DETECTED
Benzodiazepines: POSITIVE — AB
Cocaine: NOT DETECTED
Opiates: NOT DETECTED
Tetrahydrocannabinol: NOT DETECTED

## 2021-05-03 LAB — URINALYSIS, ROUTINE W REFLEX MICROSCOPIC
Bilirubin Urine: NEGATIVE
Glucose, UA: NEGATIVE mg/dL
Hgb urine dipstick: NEGATIVE
Ketones, ur: 20 mg/dL — AB
Leukocytes,Ua: NEGATIVE
Nitrite: NEGATIVE
Protein, ur: 100 mg/dL — AB
Specific Gravity, Urine: 1.027 (ref 1.005–1.030)
pH: 8 (ref 5.0–8.0)

## 2021-05-03 LAB — I-STAT BETA HCG BLOOD, ED (MC, WL, AP ONLY): I-stat hCG, quantitative: 7 m[IU]/mL — ABNORMAL HIGH (ref <5)

## 2021-05-03 LAB — PREGNANCY, URINE: Preg Test, Ur: NEGATIVE

## 2021-05-03 LAB — RESP PANEL BY RT-PCR (FLU A&B, COVID) ARPGX2
Influenza A by PCR: NEGATIVE
Influenza B by PCR: NEGATIVE
SARS Coronavirus 2 by RT PCR: NEGATIVE

## 2021-05-03 MED ORDER — LORAZEPAM 2 MG/ML IJ SOLN
1.0000 mg | Freq: Three times a day (TID) | INTRAMUSCULAR | Status: DC | PRN
Start: 2021-05-03 — End: 2021-05-04
  Administered 2021-05-03 – 2021-05-04 (×3): 1 mg via INTRAVENOUS
  Filled 2021-05-03 (×3): qty 1

## 2021-05-03 MED ORDER — SODIUM CHLORIDE 0.9 % IV SOLN: INTRAVENOUS | Status: DC

## 2021-05-03 MED ORDER — METOCLOPRAMIDE HCL 5 MG/ML IJ SOLN
10.0000 mg | Freq: Once | INTRAMUSCULAR | Status: AC
  Administered 2021-05-03: 10 mg via INTRAVENOUS
  Filled 2021-05-03: qty 2

## 2021-05-03 MED ORDER — PROCHLORPERAZINE MALEATE 10 MG PO TABS
10.0000 mg | ORAL_TABLET | Freq: Once | ORAL | Status: AC
  Administered 2021-05-03: 10 mg via ORAL
  Filled 2021-05-03: qty 1

## 2021-05-03 MED ORDER — HYDRALAZINE HCL 20 MG/ML IJ SOLN: 10.0000 mg | Freq: Three times a day (TID) | INTRAMUSCULAR | Status: DC | PRN

## 2021-05-03 MED ORDER — SODIUM CHLORIDE 0.9 % IV SOLN: Freq: Once | INTRAVENOUS | Status: AC

## 2021-05-03 MED ORDER — PANTOPRAZOLE SODIUM 40 MG IV SOLR
40.0000 mg | Freq: Two times a day (BID) | INTRAVENOUS | Status: DC
  Administered 2021-05-03 – 2021-05-05 (×5): 40 mg via INTRAVENOUS
  Filled 2021-05-03 (×6): qty 10

## 2021-05-03 MED ORDER — SODIUM CHLORIDE 0.9 % IV SOLN
25.0000 mg | Freq: Four times a day (QID) | INTRAVENOUS | Status: DC | PRN
  Filled 2021-05-03: qty 1

## 2021-05-03 MED ORDER — SUCRALFATE 1 GM/10ML PO SUSP
1.0000 g | Freq: Three times a day (TID) | ORAL | Status: DC
  Administered 2021-05-03 – 2021-05-05 (×6): 1 g via ORAL
  Filled 2021-05-03 (×9): qty 10

## 2021-05-03 MED ORDER — PANTOPRAZOLE SODIUM 40 MG IV SOLR
40.0000 mg | Freq: Once | INTRAVENOUS | Status: AC
Start: 2021-05-03 — End: 2021-05-03
  Administered 2021-05-03: 40 mg via INTRAVENOUS
  Filled 2021-05-03: qty 10

## 2021-05-03 MED ORDER — SODIUM CHLORIDE 0.9 % IV BOLUS
1000.0000 mL | Freq: Once | INTRAVENOUS | Status: AC
  Administered 2021-05-03: 1000 mL via INTRAVENOUS

## 2021-05-03 MED ORDER — PROCHLORPERAZINE EDISYLATE 10 MG/2ML IJ SOLN
10.0000 mg | Freq: Four times a day (QID) | INTRAMUSCULAR | Status: DC | PRN
  Administered 2021-05-03 – 2021-05-05 (×8): 10 mg via INTRAVENOUS
  Filled 2021-05-03 (×8): qty 2

## 2021-05-03 MED ORDER — DICYCLOMINE HCL 10 MG/5ML PO SOLN
10.0000 mg | Freq: Three times a day (TID) | ORAL | Status: DC
  Administered 2021-05-04 – 2021-05-05 (×5): 10 mg via ORAL
  Filled 2021-05-03 (×6): qty 5

## 2021-05-03 MED ORDER — DROPERIDOL 2.5 MG/ML IJ SOLN
2.5000 mg | Freq: Once | INTRAMUSCULAR | Status: AC
  Administered 2021-05-03: 2.5 mg via INTRAVENOUS
  Filled 2021-05-03: qty 2

## 2021-05-03 MED ORDER — METOCLOPRAMIDE HCL 5 MG/ML IJ SOLN
10.0000 mg | Freq: Three times a day (TID) | INTRAMUSCULAR | Status: DC
  Administered 2021-05-03 – 2021-05-04 (×2): 10 mg via INTRAVENOUS
  Filled 2021-05-03 (×3): qty 2

## 2021-05-03 MED ORDER — DIPHENHYDRAMINE HCL 50 MG/ML IJ SOLN
12.5000 mg | Freq: Once | INTRAMUSCULAR | Status: AC
  Administered 2021-05-03: 12.5 mg via INTRAVENOUS
  Filled 2021-05-03: qty 1

## 2021-05-03 MED ORDER — ONDANSETRON HCL 4 MG/2ML IJ SOLN
4.0000 mg | Freq: Once | INTRAMUSCULAR | Status: AC
  Administered 2021-05-03: 4 mg via INTRAVENOUS
  Filled 2021-05-03: qty 2

## 2021-05-03 NOTE — Plan of Care (Signed)

## 2021-05-03 NOTE — ED Triage Notes (Signed)
Pt BIB EMS with reports of nausea and vomiting since today. Pt states that she has had bright red blood in her vomit the last 2 times.

## 2021-05-03 NOTE — H&P (View-Only) (Signed)
Referring Provider: ED Primary Care Physician:  Harvie Heck, MD Primary Gastroenterologist:  Gentry Fitz  Reason for Consultation:  Nausea and vomiting  HPI: Monique Shaw is a 41 y.o. female medical history significant for asthma, hypertension, hyperlipidemia, anxiety, GERD, IBS, migraines, morbid obesity, and sleep apnea who presents for evaluation of nausea and vomiting.  Patient recently admitted to [facility] long for same from 2/7 to 2/11.  At that time CT abdomen pelvis was negative for acute etiology.  Symptoms were thought to be related to up titration of her current Ozempic (which she is on for weight loss).  She was treated with supportive care and discharged on Reglan and Compazine.  She states after she went home Saturday she was able to tolerate a soft diet without difficulty.  She states Compazine and Reglan provided relief. Monday/Tuesday (2/13) she started to develop recurrent nausea and vomiting.  States she could not keep anything down.  Tuesday night (2/14) during vomiting she had a few episodes of hematemesis.  States she saw 1 tablespoon of bright red blood with her vomit. Denies coffee-ground emesis.  She states she has had episodes like this before about 6 years ago, this was attributed to norovirus.  Denies melena/hematochezia.  Denies unintentional weight loss.  Denies abdominal pain.    History of EGD 07/2018 [facility] health, document unavailable.  Also had colonoscopy 08/2020, unable to complete due to poor prep.  History of colon cancer in her mother diagnosed in her 26s. History of gastric dumping syndrome, normal gastric emptying in 2022.  States that she regularly takes ibuprofen due to frequent headaches, though not daily.  Denies history of tobacco and alcohol use.   Past Medical History:  Diagnosis Date   Allergy    Anxiety    ASCVD (arteriosclerotic cardiovascular disease) 2011   Asthma    Blood type, Rh negative    Depression    GERD  (gastroesophageal reflux disease)    H/O migraine 10/25/10   H/O toxoplasmosis    H/O varicella    pt denies    History of elevated lipids 2011   Hx: UTI (urinary tract infection) 2007   Hypertension    IBS (irritable bowel syndrome)    Irregular menstrual cycle 2004   Migraine    Obese    Sleep apnea    Weight gain 2004    Past Surgical History:  Procedure Laterality Date   gastroenteritis     WISDOM TOOTH EXTRACTION      Prior to Admission medications   Medication Sig Start Date End Date Taking? Authorizing Provider  albuterol (VENTOLIN HFA) 108 (90 Base) MCG/ACT inhaler 2 puffs every 4 (four) hours as needed for wheezing or shortness of breath. 12/15/20   [provider]  ALPRAZolam Prudy Feeler) 1 MG tablet Take 1 mg by mouth 4 (four) times daily as needed for anxiety. 02/20/21   [provider]  ARIPiprazole (ABILIFY) 5 MG tablet Take 5 mg by mouth at bedtime. 04/05/21   [provider]  atorvastatin (LIPITOR) 20 MG tablet Take 20 mg by mouth daily.    [provider]  azelastine (OPTIVAR) 0.05 % ophthalmic solution Place 1 drop into both eyes daily. 12/15/20   [provider]  Azelastine HCl 137 MCG/SPRAY SOLN SMARTSIG:1-2 Spray(s) Both Nares Twice Daily 12/15/20   [provider]  buPROPion (WELLBUTRIN XL) 300 MG 24 hr tablet Take 300 mg by mouth every morning. 03/27/21   [provider]  busPIRone (BUSPAR) 15  MG tablet Take 15 mg by mouth 2 (two) times daily. 03/02/21   [provider]  Cholecalciferol (VITAMIN D3) 1.25 MG (50000 UT) CAPS Take 1 capsule by mouth once a week. 02/21/21   [provider]  desvenlafaxine (PRISTIQ) 100 MG 24 hr tablet Take 100 mg by mouth daily.    [provider]  famotidine (PEPCID) 40 MG tablet Take 40 mg by mouth daily. 03/08/21   [provider]  hydrOXYzine (ATARAX) 25 MG tablet Take 50 mg by mouth in the morning. 02/20/21   [provider]   lisinopril (ZESTRIL) 5 MG tablet Take 5 mg by mouth daily. 03/08/21   [provider]  methocarbamol (ROBAXIN) 500 MG tablet Take 1 tablet (500 mg total) by mouth 2 (two) times daily. Patient not taking: Reported on 04/25/2021 02/27/17   Audry Pili, PA-C  metoCLOPramide (REGLAN) 5 MG tablet Take 1 tablet (5 mg total) by mouth every 6 (six) hours as needed for up to 3 days for refractory nausea / vomiting (use for nausea/vomiting not improved with compazine). 04/29/21 05/02/21  Zigmund Daniel., MD  montelukast (SINGULAIR) 10 MG tablet Take 10 mg by mouth daily. 04/04/21   [provider]  pantoprazole (PROTONIX) 40 MG tablet Take 40 mg by mouth daily. 03/08/21   [provider]  prochlorperazine (COMPAZINE) 5 MG tablet Take 2 tablets (10 mg total) by mouth every 8 (eight) hours as needed for up to 5 days for nausea or vomiting. 04/29/21 05/04/21  Zigmund Daniel., MD  QELBREE 200 MG 24 hr capsule Take 200 mg by mouth daily. 04/14/21   [provider]  SUMAtriptan (IMITREX) 100 MG tablet Take 100 mg by mouth every 2 (two) hours as needed for migraine or headache. 04/24/21   [provider]  SUMAtriptan (IMITREX) 20 MG/ACT nasal spray Place 1 spray (20 mg total) into the nose every 2 (two) hours as needed for migraine or headache. May repeat in 2 hours if headache persists or recurs. 01/17/16   McVey, Madelaine Bhat, PA-C  SUMAtriptan (IMITREX) 50 MG tablet Take 1 tablet (50 mg total) by mouth once. May repeat in 2 hours if needed. No more than 2 per 24 hours. No more than 3 times per week. 01/17/16 05/17/16  McVey, Madelaine Bhat, PA-C  traZODone (DESYREL) 50 MG tablet Take 50 mg by mouth at bedtime. 02/22/21   [provider]    Scheduled Meds: Continuous Infusions:  promethazine (PHENERGAN) injection (IM or IVPB)     PRN Meds:.promethazine (PHENERGAN) injection (IM or IVPB)  Allergies as of 05/03/2021   (No Known Allergies)     Family History  Problem Relation Age of Onset   Heart disease Mother    Hypertension Mother    Cancer Mother    Hypertension Father    Anemia Father    Hypertension Maternal Grandmother    Heart disease Paternal Grandfather        MI   Cancer Maternal Uncle    Cancer Paternal Aunt    Alcohol abuse Paternal Uncle     Social History   Socioeconomic History   Marital status: Married    Spouse name: Molly Maduro   Number of children: 1   Years of education: M. 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  Tobacco Use   Smoking status: Never   Smokeless tobacco: Never  Substance and Sexual Activity   Alcohol use: No   Drug use: No   Sexual activity: Yes    Birth control/protection: None    Comment: ALESSE  Other Topics Concern   Not on file  Social History Narrative   Pt lives at home with husband Molly Maduro) and son   Pt is right handed    Education- Masters   Caffeine- occasional soda or tea but not every day   Social Determinants of Corporate investment banker Strain: Not on file  Food Insecurity: Not on file  Transportation Needs: Not on file  Physical Activity: Not on file  Stress: Not on file  Social Connections: Not on file  Intimate Partner Violence: Not on file    Review of Systems: Review of Systems  Constitutional:  Negative for chills and fever.  HENT:  Negative for hearing loss and tinnitus.   Eyes:  Negative for blurred vision and double vision.  Respiratory:  Negative for cough and hemoptysis.   Cardiovascular:  Negative for chest pain and palpitations.  Gastrointestinal:  Positive for nausea and vomiting (hematemesis). Negative for abdominal pain, blood in stool, constipation, diarrhea, heartburn and melena.  Genitourinary:  Negative for dysuria and urgency.  Musculoskeletal:  Negative for myalgias and neck pain.  Skin:  Negative for itching and rash.   Neurological:  Negative for seizures and loss of consciousness.  Psychiatric/Behavioral:  Negative for substance abuse. The patient is not nervous/anxious.     Physical Exam:Physical Exam Constitutional:      Appearance: She is obese.  HENT:     Head: Normocephalic and atraumatic.     Nose: Nose normal. No congestion.     Mouth/Throat:     Mouth: Mucous membranes are moist.     Pharynx: Oropharynx is clear.  Eyes:     Extraocular Movements: Extraocular movements intact.     Conjunctiva/sclera: Conjunctivae normal.  Cardiovascular:     Rate and Rhythm: Normal rate and regular rhythm.  Pulmonary:     Effort: Pulmonary effort is normal. No respiratory distress.  Abdominal:     General: There is no distension.     Palpations: Abdomen is soft. There is no mass.     Tenderness: There is no abdominal tenderness. There is no guarding or rebound.     Hernia: No hernia is present.  Musculoskeletal:        General: No swelling. Normal range of motion.     Cervical back: Normal range of motion and neck supple.  Skin:    General: Skin is warm and dry.  Neurological:     General: No focal deficit present.     Mental Status: She is alert and oriented to person, place, and time.  Psychiatric:        Mood and Affect: Mood normal.        Behavior: Behavior normal.        Thought Content: Thought content normal.        Judgment: Judgment normal.    Vital signs: Vitals:   05/03/21 0730 05/03/21 0841  BP: (!) 149/110 (!) 156/98  Pulse: 76 78  Resp: 14 16  Temp:    SpO2: 100% 100%        GI:  Lab Results: Recent Labs    05/02/21 2007  WBC 8.2  HGB 14.8  HCT 44.8  PLT 277   BMET Recent Labs    05/02/21 2007  NA 133*  K 3.5  CL 101  CO2  23  GLUCOSE 143*  BUN 16  CREATININE 1.16*  CALCIUM 9.1   LFT Recent Labs    05/02/21 2007  PROT 7.1  ALBUMIN 4.1  AST 18  ALT 18  ALKPHOS 86  BILITOT 0.7   PT/INR No results for input(s): LABPROT, INR in the last 72  hours.   Studies/Results: No results found.  Impression: Nausea and vomiting -CT abdomen pelvis with contrast 2/7: no acute abnormality, hepatic steatosis - hgb 14.8 - BUN 16, Cr. 1.16 - lipase 37  Plan: Plan for EGD tomorrow to rule out PUD, gastritis, esophagitis as etiology. I thoroughly discussed the procedures to include nature, alternatives, benefits, and risks including but not limited to bleeding, perforation, infection, anesthesia/cardiac and pulmonary complications. Patient provides understanding and gave verbal consent to proceed. Continue clear liquid diet NPO at midnight Continue anti-emetics and supportive care as needed. Continue compazine and reglan. [facility] will follow.      LOS: 0 days   Legrand ComoBayley M Kayde Warehime  PA-C 05/03/2021, 9:41 AM  Contact #  [phone]

## 2021-05-03 NOTE — ED Provider Notes (Signed)
Patient seen after prior EP  Patient with continued symptoms - nausea, hiccuping, emesis.  Patient would likely benefit from admission.    Dr. Ronaldo Miyamoto with hospitalist service is aware of the case.  Dr. Levora Angel with [facility] is also aware and will consult.   Wynetta Fines, MD 05/03/21 [phone]

## 2021-05-03 NOTE — Consult Note (Addendum)
Referring Provider: ED °Primary Care Physician:  Jackson, Mishi, MD °Primary Gastroenterologist:  Unassigned ° °Reason for Consultation:  Nausea and vomiting ° °HPI: Monique Shaw is a 41 y.o. female medical history significant for asthma, hypertension, hyperlipidemia, anxiety, GERD, IBS, migraines, morbid obesity, and sleep apnea who presents for evaluation of nausea and vomiting. ° °Patient recently admitted to [facility] for same from 2/7 to 2/11.  At that time CT abdomen pelvis was negative for acute etiology.  Symptoms were thought to be related to up titration of her current Ozempic (which she is on for weight loss).  She was treated with supportive care and discharged on Reglan and Compazine. ° °She states after she went home Saturday she was able to tolerate a soft diet without difficulty.  She states Compazine and Reglan provided relief. Monday/Tuesday (2/13) she started to develop recurrent nausea and vomiting.  States she could not keep anything down.  Tuesday night (2/14) during vomiting she had a few episodes of hematemesis.  States she saw 1 tablespoon of bright red blood with her vomit. Denies coffee-ground emesis.  She states she has had episodes like this before about 6 years ago, this was attributed to norovirus.  Denies melena/hematochezia.  Denies unintentional weight loss.  Denies abdominal pain.   ° °History of EGD 07/2018 [facility], document unavailable.  Also had colonoscopy 08/2020, unable to complete due to poor prep.  History of colon cancer in her mother diagnosed in her 50s. History of gastric dumping syndrome, normal gastric emptying in 2022.  States that she regularly takes ibuprofen due to frequent headaches, though not daily.  Denies history of tobacco and alcohol use. ° ° °Past Medical History:  °Diagnosis Date  ° Allergy   ° Anxiety   ° ASCVD (arteriosclerotic cardiovascular disease) 2011  ° Asthma   ° Blood type, Rh negative   ° Depression   ° GERD  (gastroesophageal reflux disease)   ° H/O migraine 10/25/10  ° H/O toxoplasmosis   ° H/O varicella   ° pt denies   ° History of elevated lipids 2011  ° Hx: UTI (urinary tract infection) 2007  ° Hypertension   ° IBS (irritable bowel syndrome)   ° Irregular menstrual cycle 2004  ° Migraine   ° Obese   ° Sleep apnea   ° Weight gain 2004  ° ° °Past Surgical History:  °Procedure Laterality Date  ° gastroenteritis    ° WISDOM TOOTH EXTRACTION    ° ° °Prior to Admission medications   °Medication Sig Start Date End Date Taking? Authorizing Provider  °albuterol (VENTOLIN HFA) 108 (90 Base) MCG/ACT inhaler 2 puffs every 4 (four) hours as needed for wheezing or shortness of breath. 12/15/20   [provider]  °ALPRAZolam (XANAX) 1 MG tablet Take 1 mg by mouth 4 (four) times daily as needed for anxiety. 02/20/21   [provider]  °ARIPiprazole (ABILIFY) 5 MG tablet Take 5 mg by mouth at bedtime. 04/05/21   [provider]  °atorvastatin (LIPITOR) 20 MG tablet Take 20 mg by mouth daily.    [provider]  °azelastine (OPTIVAR) 0.05 % ophthalmic solution Place 1 drop into both eyes daily. 12/15/20   [provider]  °Azelastine HCl 137 MCG/SPRAY SOLN SMARTSIG:1-2 Spray(s) Both Nares Twice Daily 12/15/20   [provider]  °buPROPion (WELLBUTRIN XL) 300 MG 24 hr tablet Take 300 mg by mouth every morning. 03/27/21   [provider]  °busPIRone (BUSPAR) 15   MG tablet Take 15 mg by mouth 2 (two) times daily. 03/02/21   [provider]  Cholecalciferol (VITAMIN D3) 1.25 MG (50000 UT) CAPS Take 1 capsule by mouth once a week. 02/21/21   [provider]  desvenlafaxine (PRISTIQ) 100 MG 24 hr tablet Take 100 mg by mouth daily.    [provider]  famotidine (PEPCID) 40 MG tablet Take 40 mg by mouth daily. 03/08/21   [provider]  hydrOXYzine (ATARAX) 25 MG tablet Take 50 mg by mouth in the morning. 02/20/21   [provider]   lisinopril (ZESTRIL) 5 MG tablet Take 5 mg by mouth daily. 03/08/21   [provider]  methocarbamol (ROBAXIN) 500 MG tablet Take 1 tablet (500 mg total) by mouth 2 (two) times daily. Patient not taking: Reported on 04/25/2021 02/27/17   Audry Pili, PA-C  metoCLOPramide (REGLAN) 5 MG tablet Take 1 tablet (5 mg total) by mouth every 6 (six) hours as needed for up to 3 days for refractory nausea / vomiting (use for nausea/vomiting not improved with compazine). 04/29/21 05/02/21  Zigmund Daniel., MD  montelukast (SINGULAIR) 10 MG tablet Take 10 mg by mouth daily. 04/04/21   [provider]  pantoprazole (PROTONIX) 40 MG tablet Take 40 mg by mouth daily. 03/08/21   [provider]  prochlorperazine (COMPAZINE) 5 MG tablet Take 2 tablets (10 mg total) by mouth every 8 (eight) hours as needed for up to 5 days for nausea or vomiting. 04/29/21 05/04/21  Zigmund Daniel., MD  QELBREE 200 MG 24 hr capsule Take 200 mg by mouth daily. 04/14/21   [provider]  SUMAtriptan (IMITREX) 100 MG tablet Take 100 mg by mouth every 2 (two) hours as needed for migraine or headache. 04/24/21   [provider]  SUMAtriptan (IMITREX) 20 MG/ACT nasal spray Place 1 spray (20 mg total) into the nose every 2 (two) hours as needed for migraine or headache. May repeat in 2 hours if headache persists or recurs. 01/17/16   McVey, Madelaine Bhat, PA-C  SUMAtriptan (IMITREX) 50 MG tablet Take 1 tablet (50 mg total) by mouth once. May repeat in 2 hours if needed. No more than 2 per 24 hours. No more than 3 times per week. 01/17/16 05/17/16  McVey, Madelaine Bhat, PA-C  traZODone (DESYREL) 50 MG tablet Take 50 mg by mouth at bedtime. 02/22/21   [provider]    Scheduled Meds: Continuous Infusions:  promethazine (PHENERGAN) injection (IM or IVPB)     PRN Meds:.promethazine (PHENERGAN) injection (IM or IVPB)  Allergies as of 05/03/2021   (No Known Allergies)     Family History  Problem Relation Age of Onset   Heart disease Mother    Hypertension Mother    Cancer Mother    Hypertension Father    Anemia Father    Hypertension Maternal Grandmother    Heart disease Paternal Grandfather        MI   Cancer Maternal Uncle    Cancer Paternal Aunt    Alcohol abuse Paternal Uncle     Social History   Socioeconomic History   Marital status: Married    Spouse name: Molly Maduro   Number of children: 1   Years of education: M. 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  Tobacco Use   Smoking status: Never   Smokeless tobacco: Never  Substance and Sexual Activity   Alcohol use: No   Drug use: No   Sexual activity: Yes    Birth control/protection: None    Comment: ALESSE  Other Topics Concern   Not on file  Social History Narrative   Pt lives at home with husband Molly Maduro) and son   Pt is right handed    Education- Masters   Caffeine- occasional soda or tea but not every day   Social Determinants of Corporate investment banker Strain: Not on file  Food Insecurity: Not on file  Transportation Needs: Not on file  Physical Activity: Not on file  Stress: Not on file  Social Connections: Not on file  Intimate Partner Violence: Not on file    Review of Systems: Review of Systems  Constitutional:  Negative for chills and fever.  HENT:  Negative for hearing loss and tinnitus.   Eyes:  Negative for blurred vision and double vision.  Respiratory:  Negative for cough and hemoptysis.   Cardiovascular:  Negative for chest pain and palpitations.  Gastrointestinal:  Positive for nausea and vomiting (hematemesis). Negative for abdominal pain, blood in stool, constipation, diarrhea, heartburn and melena.  Genitourinary:  Negative for dysuria and urgency.  Musculoskeletal:  Negative for myalgias and neck pain.  Skin:  Negative for itching and rash.   Neurological:  Negative for seizures and loss of consciousness.  Psychiatric/Behavioral:  Negative for substance abuse. The patient is not nervous/anxious.     Physical Exam:Physical Exam Constitutional:      Appearance: She is obese.  HENT:     Head: Normocephalic and atraumatic.     Nose: Nose normal. No congestion.     Mouth/Throat:     Mouth: Mucous membranes are moist.     Pharynx: Oropharynx is clear.  Eyes:     Extraocular Movements: Extraocular movements intact.     Conjunctiva/sclera: Conjunctivae normal.  Cardiovascular:     Rate and Rhythm: Normal rate and regular rhythm.  Pulmonary:     Effort: Pulmonary effort is normal. No respiratory distress.  Abdominal:     General: There is no distension.     Palpations: Abdomen is soft. There is no mass.     Tenderness: There is no abdominal tenderness. There is no guarding or rebound.     Hernia: No hernia is present.  Musculoskeletal:        General: No swelling. Normal range of motion.     Cervical back: Normal range of motion and neck supple.  Skin:    General: Skin is warm and dry.  Neurological:     General: No focal deficit present.     Mental Status: She is alert and oriented to person, place, and time.  Psychiatric:        Mood and Affect: Mood normal.        Behavior: Behavior normal.        Thought Content: Thought content normal.        Judgment: Judgment normal.    Vital signs: Vitals:   05/03/21 0730 05/03/21 0841  BP: (!) 149/110 (!) 156/98  Pulse: 76 78  Resp: 14 16  Temp:    SpO2: 100% 100%        GI:  Lab Results: Recent Labs    05/02/21 2007  WBC 8.2  HGB 14.8  HCT 44.8  PLT 277   BMET Recent Labs    05/02/21 2007  NA 133*  K 3.5  CL 101  CO2  23  °GLUCOSE 143*  °BUN 16  °CREATININE 1.16*  °CALCIUM 9.1  ° °LFT °Recent Labs  °  05/02/21 °2007  °PROT 7.1  °ALBUMIN 4.1  °AST 18  °ALT 18  °ALKPHOS 86  °BILITOT 0.7  ° °PT/INR °No results for input(s): LABPROT, INR in the last 72  hours. ° ° °Studies/Results: °No results found. ° °Impression: °Nausea and vomiting °-CT abdomen pelvis with contrast 2/7: no acute abnormality, hepatic steatosis °- hgb 14.8 °- BUN 16, Cr. 1.16 °- lipase 37 ° °Plan: °Plan for EGD tomorrow to rule out PUD, gastritis, esophagitis as etiology. I thoroughly discussed the procedures to include nature, alternatives, benefits, and risks including but not limited to bleeding, perforation, infection, anesthesia/cardiac and pulmonary complications. Patient provides understanding and gave verbal consent to proceed. °Continue clear liquid diet °NPO at midnight °Continue anti-emetics and supportive care as needed. °Continue compazine and reglan. °[facility] will follow.    ° ° LOS: 0 days  ° °Dimarco Minkin M Con Arganbright  PA-C °05/03/2021, 9:41 AM ° °Contact #  [phone]  °

## 2021-05-03 NOTE — H&P (Signed)
History and Physical    Patient: Monique Shaw DOB: [birth year] DOA: 05/03/2021 DOS: the patient was seen and examined on 05/03/2021 PCP: Harvie Heck, MD  Patient coming from: Home  Chief Complaint:  Chief Complaint  Patient presents with   Nausea    HPI: Monique Shaw is a 41 y.o. female with medical history significant of morbid obesity, GERD, anxiety, chronic nausea/vomiting. Presenting with N/V. Recently discharged from hospital after an admission for the same. At the time, it was believed that her ozempic was causing her problem. Her w/u was negative otherwise. She reports that she did ok for the first couple of days home, but her symptoms returned yesterday. She's had constant wretching. She has had some blood in her vomit. She tried taking reglan and compazine, but they didn't work. She couldn't keep anything down, so she decided to come back to the ED for help.     Review of Systems: As mentioned in the history of present illness. All other systems reviewed and are negative. Past Medical History:  Diagnosis Date   Allergy    Anxiety    ASCVD (arteriosclerotic cardiovascular disease) 2011   Asthma    Blood type, Rh negative    Depression    GERD (gastroesophageal reflux disease)    H/O migraine 10/25/10   H/O toxoplasmosis    H/O varicella    pt denies    History of elevated lipids 2011   Hx: UTI (urinary tract infection) 2007   Hypertension    IBS (irritable bowel syndrome)    Irregular menstrual cycle 2004   Migraine    Obese    Sleep apnea    Weight gain 2004   Past Surgical History:  Procedure Laterality Date   gastroenteritis     WISDOM TOOTH EXTRACTION     Social History:  reports that she has never smoked. She has never used smokeless tobacco. She reports that she does not drink alcohol and does not use drugs.  No Known Allergies  Family History  Problem Relation Age of Onset   Heart disease Mother     Hypertension Mother    Cancer Mother    Hypertension Father    Anemia Father    Hypertension Maternal Grandmother    Heart disease Paternal Grandfather        MI   Cancer Maternal Uncle    Cancer Paternal Aunt    Alcohol abuse Paternal Uncle     Prior to Admission medications   Medication Sig Start Date End Date Taking? Authorizing Provider  albuterol (VENTOLIN HFA) 108 (90 Base) MCG/ACT inhaler 2 puffs every 4 (four) hours as needed for wheezing or shortness of breath. 12/15/20   [provider]  ALPRAZolam Prudy Feeler) 1 MG tablet Take 1 mg by mouth 4 (four) times daily as needed for anxiety. 02/20/21   [provider]  ARIPiprazole (ABILIFY) 5 MG tablet Take 5 mg by mouth at bedtime. 04/05/21   [provider]  atorvastatin (LIPITOR) 20 MG tablet Take 20 mg by mouth daily.    [provider]  azelastine (OPTIVAR) 0.05 % ophthalmic solution Place 1 drop into both eyes daily. 12/15/20   [provider]  Azelastine HCl 137 MCG/SPRAY SOLN SMARTSIG:1-2 Spray(s) Both Nares Twice Daily 12/15/20   [provider]  buPROPion (WELLBUTRIN XL) 300 MG 24 hr tablet Take 300 mg by mouth every morning. 03/27/21   [provider]  busPIRone (BUSPAR) 15 MG tablet Take 15  mg by mouth 2 (two) times daily. 03/02/21   [provider]  Cholecalciferol (VITAMIN D3) 1.25 MG (50000 UT) CAPS Take 1 capsule by mouth once a week. 02/21/21   [provider]  desvenlafaxine (PRISTIQ) 100 MG 24 hr tablet Take 100 mg by mouth daily.    [provider]  famotidine (PEPCID) 40 MG tablet Take 40 mg by mouth daily. 03/08/21   [provider]  hydrOXYzine (ATARAX) 25 MG tablet Take 50 mg by mouth in the morning. 02/20/21   [provider]  lisinopril (ZESTRIL) 5 MG tablet Take 5 mg by mouth daily. 03/08/21   [provider]  methocarbamol (ROBAXIN) 500 MG tablet Take 1 tablet (500 mg total) by mouth 2 (two) times  daily. Patient not taking: Reported on 04/25/2021 02/27/17   Audry Pili, PA-C  metoCLOPramide (REGLAN) 5 MG tablet Take 1 tablet (5 mg total) by mouth every 6 (six) hours as needed for up to 3 days for refractory nausea / vomiting (use for nausea/vomiting not improved with compazine). 04/29/21 05/02/21  Zigmund Daniel., MD  montelukast (SINGULAIR) 10 MG tablet Take 10 mg by mouth daily. 04/04/21   [provider]  pantoprazole (PROTONIX) 40 MG tablet Take 40 mg by mouth daily. 03/08/21   [provider]  prochlorperazine (COMPAZINE) 5 MG tablet Take 2 tablets (10 mg total) by mouth every 8 (eight) hours as needed for up to 5 days for nausea or vomiting. 04/29/21 05/04/21  Zigmund Daniel., MD  QELBREE 200 MG 24 hr capsule Take 200 mg by mouth daily. 04/14/21   [provider]  SUMAtriptan (IMITREX) 100 MG tablet Take 100 mg by mouth every 2 (two) hours as needed for migraine or headache. 04/24/21   [provider]  SUMAtriptan (IMITREX) 20 MG/ACT nasal spray Place 1 spray (20 mg total) into the nose every 2 (two) hours as needed for migraine or headache. May repeat in 2 hours if headache persists or recurs. 01/17/16   McVey, Madelaine Bhat, PA-C  SUMAtriptan (IMITREX) 50 MG tablet Take 1 tablet (50 mg total) by mouth once. May repeat in 2 hours if needed. No more than 2 per 24 hours. No more than 3 times per week. 01/17/16 05/17/16  McVey, Madelaine Bhat, PA-C  traZODone (DESYREL) 50 MG tablet Take 50 mg by mouth at bedtime. 02/22/21   [provider]    Physical Exam: Vitals:   05/03/21 0630 05/03/21 0700 05/03/21 0730 05/03/21 0841  BP: 136/87 (!) 140/103 (!) 149/110 (!) 156/98  Pulse: 66 83 76 78  Resp: 13 19 14 16   Temp:      TempSrc:      SpO2: 100% 100% 100% 100%   General: 41 y.o. female resting in bed in NAD Eyes: PERRL, normal sclera ENMT: Nares patent w/o discharge, orophaynx clear, dentition normal, ears w/o  discharge/lesions/ulcers Neck: Supple, trachea midline Cardiovascular: RRR, +S1, S2, no m/g/r, equal pulses throughout Respiratory: CTABL, no w/r/r, normal WOB GI: BS+, NDNT, no masses noted, no organomegaly noted MSK: No e/c/c Neuro: A&O x 3, no focal deficits Psyc: Appropriate interaction and affect, calm/cooperative  Data Reviewed:  Na+ 133 Scr 1.16 Glucose 143   Assessment and Plan: No notes have been filed under this hospital service. Service: Hospitalist Intractable N/V     - place in obs, med-surg     - compazine, reglan, ativan, protonix     - IVF, CLD, NPOpMN     - [facility]  consulted, appreciate assistance, EGD in AM  Anxiety     - continue home regimen as tolerated  HLD     - resume home regimen as tolerated  Asthma     - continue home regimen  HTN     - continue home regimen as tolerated; PRN available  Morbid obesity     - continue outpt follow up; counsel on diet, lifestyle changes  Hyperglycemia     - ?official DM diagnosis; previous on ozempic     - check A1c  Advance Care Planning:   Code Status: Prior FULL  Consults: [facility]  Family Communication: None at bedside  Severity of Illness: The appropriate patient status for this patient is OBSERVATION. Observation status is judged to be reasonable and necessary in order to provide the required intensity of service to ensure the patient's safety. The patient's presenting symptoms, physical exam findings, and initial radiographic and laboratory data in the context of their medical condition is felt to place them at decreased risk for further clinical deterioration. Furthermore, it is anticipated that the patient will be medically stable for discharge from the hospital within 2 midnights of admission.   Author: Teddy Spike, DO 05/03/2021 9:19 AM  For on call review [email or web address].

## 2021-05-03 NOTE — ED Provider Notes (Signed)
[facility]-EMERGENCY DEPT Provider Note   [name]: [id number] Arrival date & time: 05/03/21  0023     History  Chief Complaint  Patient presents with   Nausea    Sabino Snipesvie S [place] Duke SalviaRandolph is a 41 y.o. female.  HPI     This is a 41 year old female with a history of obesity, hypertension who presents with nausea, vomiting.  Patient reports that she was just admitted to the hospital and discharged on Saturday.  At that time she had nausea and vomiting that was thought to be related to up titration of Ozempic.  She has not taken Ozempic in 8 days.  She was on this medication for weight loss.  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  Patient states she went and has had recurrence of nausea and vomiting.  She now noted streaks of blood in her vomit.  She is on omeprazole as well as Compazine and Reglan; however, she states she has been unable to keep anything down.  She denies any abdominal pain.  No fevers.  Chart reviewed.  She was medically managed in the hospital with IV fluids and nausea medication.  She was also initially triaged at [facility].  At that time blood work was drawn.  Slightly elevated creatinine but otherwise no significant metabolic derangements.  Home Medications Prior to Admission medications   Medication Sig Start Date End Date Taking? Authorizing Provider  albuterol (VENTOLIN HFA) 108 (90 Base) MCG/ACT inhaler 2 puffs every 4 (four) hours as needed for wheezing or shortness of breath. 12/15/20   [provider]  ALPRAZolam Prudy Feeler(XANAX) 1 MG tablet Take 1 mg by mouth 4 (four) times daily as needed for anxiety. 02/20/21   [provider]  ARIPiprazole (ABILIFY) 5 MG tablet Take 5 mg by mouth at bedtime. 04/05/21   [provider]  atorvastatin (LIPITOR) 20 MG tablet Take 20 mg by mouth daily.    [provider]  azelastine (OPTIVAR) 0.05 % ophthalmic solution  Place 1 drop into both eyes daily. 12/15/20   [provider]  Azelastine HCl 137 MCG/SPRAY SOLN SMARTSIG:1-2 Spray(s) Both Nares Twice Daily 12/15/20   [provider]  buPROPion (WELLBUTRIN XL) 300 MG 24 hr tablet Take 300 mg by mouth every morning. 03/27/21   [provider]  busPIRone (BUSPAR) 15 MG tablet Take 15 mg by mouth 2 (two) times daily. 03/02/21   [provider]  Cholecalciferol (VITAMIN D3) 1.25 MG (50000 UT) CAPS Take 1 capsule by mouth once a week. 02/21/21   [provider]  desvenlafaxine (PRISTIQ) 100 MG 24 hr tablet Take 100 mg by mouth daily.    [provider]  famotidine (PEPCID) 40 MG tablet Take 40 mg by mouth daily. 03/08/21   [provider]  hydrOXYzine (ATARAX) 25 MG tablet Take 50 mg by mouth in the morning. 02/20/21   [provider]  lisinopril (ZESTRIL) 5 MG tablet Take 5 mg by mouth daily. 03/08/21   [provider]  methocarbamol (ROBAXIN) 500 MG tablet Take 1 tablet (500 mg total) by mouth 2 (two) times daily. Patient not taking: Reported on 04/25/2021 02/27/17   Audry PiliMohr, Tyler, PA-C  metoCLOPramide (REGLAN) 5 MG tablet Take 1 tablet (5 mg total) by mouth every 6 (six) hours as needed for up to 3 days for refractory nausea / vomiting (use for nausea/vomiting not improved with compazine). 04/29/21 05/02/21  Skeet SimmerPowell, A [place]  Montez Hageman., MD  montelukast (SINGULAIR) 10 MG tablet Take 10 mg by mouth daily. 04/04/21   [provider]  pantoprazole (PROTONIX) 40 MG tablet Take 40 mg by mouth daily. 03/08/21   [provider]  prochlorperazine (COMPAZINE) 5 MG tablet Take 2 tablets (10 mg total) by mouth every 8 (eight) hours as needed for up to 5 days for nausea or vomiting. 04/29/21 05/04/21  Zigmund Daniel., MD  QELBREE 200 MG 24 hr capsule Take 200 mg by mouth daily. 04/14/21   [provider]  SUMAtriptan (IMITREX) 100 MG tablet Take 100 mg by mouth every 2 (two) hours as  needed for migraine or headache. 04/24/21   [provider]  SUMAtriptan (IMITREX) 20 MG/ACT nasal spray Place 1 spray (20 mg total) into the nose every 2 (two) hours as needed for migraine or headache. May repeat in 2 hours if headache persists or recurs. 01/17/16   McVey, Madelaine Bhat, PA-C  SUMAtriptan (IMITREX) 50 MG tablet Take 1 tablet (50 mg total) by mouth once. May repeat in 2 hours if needed. No more than 2 per 24 hours. No more than 3 times per week. 01/17/16 05/17/16  McVey, Madelaine Bhat, PA-C  traZODone (DESYREL) 50 MG tablet Take 50 mg by mouth at bedtime. 02/22/21   [provider]      Allergies    Patient has no known allergies.    Review of Systems   Review of Systems  Constitutional:  Negative for fever.  Gastrointestinal:  Positive for nausea and vomiting. Negative for abdominal pain.  All other systems reviewed and are negative.  Physical Exam Updated Vital Signs BP (!) 144/100    Pulse 72    Temp 98.1 F (36.7 C) (Oral)    Resp 11    LMP 04/11/2021    SpO2 100%  Physical Exam Vitals and nursing note reviewed.  Constitutional:      Appearance: She is well-developed. She is obese. She is not ill-appearing.  HENT:     Head: Normocephalic and atraumatic.     Mouth/Throat:     Mouth: Mucous membranes are moist.  Eyes:     Pupils: Pupils are equal, round, and reactive to light.  Cardiovascular:     Rate and Rhythm: Normal rate and regular rhythm.  Pulmonary:     Effort: Pulmonary effort is normal. No respiratory distress.  Abdominal:     General: Bowel sounds are normal.     Palpations: Abdomen is soft.     Tenderness: There is no abdominal tenderness. There is no guarding or rebound.  Musculoskeletal:     Cervical back: Neck supple.  Skin:    General: Skin is warm and dry.  Neurological:     Mental Status: She is alert and oriented to person, place, and time.  Psychiatric:        Mood and Affect: Mood normal.    ED Results /  Procedures / Treatments   Labs (all labs ordered are listed, but only abnormal results are displayed) Labs Reviewed  URINALYSIS, ROUTINE W REFLEX MICROSCOPIC  I-STAT BETA HCG BLOOD, ED ([facility], [facility], [facility] ONLY)    EKG None  Radiology No results found.  Procedures Procedures    Medications Ordered in ED Medications  sodium chloride 0.9 % bolus 1,000 mL (has no administration in time range)  metoCLOPramide (REGLAN) injection 10 mg (has no administration in time range)  diphenhydrAMINE (BENADRYL) injection 12.5 mg (has no administration in time range)  pantoprazole (  PROTONIX) injection 40 mg (has no administration in time range)    ED Course/ Medical Decision Making/ A&P                           Medical Decision Making Amount and/or Complexity of Data Reviewed Labs: ordered.  Risk Prescription drug management. Decision regarding hospitalization.   This patient presents to the ED for concern of vomiting, this involves an extensive number of treatment options, and is a complaint that carries with it a high risk of complications and morbidity.  The differential diagnosis includes gastroparesis, gastroenteritis, ongoing complications to Ozempic  MDM:    This is a 41 year old female who presents with ongoing nausea and vomiting.  Was recently admitted for the same.  Thought to be related to Ozempic use.  She has been off Ozempic for over 1 week.  She had return of nausea and vomiting at home refractory to medications provided.  She is nontoxic.  Abdominal exam is benign.  No diarrhea.  She was given nausea medication and fluids.  Labs obtained and largely reassuring.  20 ketones in the urine.  No evidence of significant dehydration.  No significant metabolic derangements.  Given benign abdominal exam, doubt intra-abdominal process.  I attempted to p.o. challenge the patient.  She vomited again.  She was redosed medication.  Patient signed out pending ability to tolerate fluids. (Labs,  imaging)  Labs: I Ordered, and personally interpreted labs.  The pertinent results include: Reassuring CBC, BMP  Imaging Studies ordered: I ordered imaging studies including none I independently visualized and interpreted imaging. I agree with the radiologist interpretation  Additional history obtained from chart.  External records from outside source obtained and reviewed including recent hospitalization  Critical Interventions: IV fluids, nausea medication  Consultations: I requested consultation with the none,  and discussed lab and imaging findings as well as pertinent plan - they recommend: None  Cardiac Monitoring: The patient was maintained on a cardiac monitor.  I personally viewed and interpreted the cardiac monitored which showed an underlying rhythm of: Sinus rhythm  Reevaluation: After the interventions noted above, I reevaluated the patient and found that they have :stayed the same   Considered admission for: Intractable nausea vomiting  Social Determinants of Health: Lives independently  Disposition: Pending  Co morbidities that complicate the patient evaluation  Past Medical History:  Diagnosis Date   Allergy    Anxiety    ASCVD (arteriosclerotic cardiovascular disease) 2011   Asthma    Blood type, Rh negative    Depression    GERD (gastroesophageal reflux disease)    H/O migraine 10/25/10   H/O toxoplasmosis    H/O varicella    pt denies    History of elevated lipids 2011   Hx: UTI (urinary tract infection) 2007   Hypertension    IBS (irritable bowel syndrome)    Irregular menstrual cycle 2004   Migraine    Obese    Sleep apnea    Weight gain 2004     Medicines Meds ordered this encounter  Medications   sodium chloride 0.9 % bolus 1,000 mL   metoCLOPramide (REGLAN) injection 10 mg   diphenhydrAMINE (BENADRYL) injection 12.5 mg   pantoprazole (PROTONIX) injection 40 mg   droperidol (INAPSINE) 2.5 MG/ML injection 2.5 mg   sodium chloride  0.9 % bolus 1,000 mL   DISCONTD: promethazine (PHENERGAN) 25 mg in sodium chloride 0.9 % 50 mL IVPB   ondansetron (ZOFRAN) injection  4 mg   prochlorperazine (COMPAZINE) tablet 10 mg   DISCONTD: 0.9 %  sodium chloride infusion   DISCONTD: pantoprazole (PROTONIX) injection 40 mg   DISCONTD: sucralfate (CARAFATE) 1 GM/10ML suspension 1 g   0.9 %  sodium chloride infusion   DISCONTD: hydrALAZINE (APRESOLINE) injection 10 mg   DISCONTD: LORazepam (ATIVAN) injection 1 mg   DISCONTD: prochlorperazine (COMPAZINE) injection 10 mg   DISCONTD: metoCLOPramide (REGLAN) injection 10 mg   DISCONTD: dicyclomine (BENTYL) 10 MG/5ML solution 10 mg   LORazepam (ATIVAN) injection 0.5 mg   DISCONTD: 0.9 % NaCl with KCl 40 mEq / L  infusion   potassium chloride 10 mEq in 100 mL IVPB   DISCONTD: lidocaine (cardiac) 100 mg/52mL (XYLOCAINE) injection 2% 65.8 mg   DISCONTD: metoCLOPramide (REGLAN) injection 5 mg   DISCONTD: mupirocin ointment (BACTROBAN) 2 % 1 application   DISCONTD: Chlorhexidine Gluconate Cloth 2 % PADS 6 each   labetalol (NORMODYNE) injection 10 mg   ondansetron (ZOFRAN-ODT) disintegrating tablet 4 mg   DISCONTD: ondansetron (ZOFRAN) injection 4 mg   DISCONTD: ALPRAZolam (XANAX) tablet 1 mg   DISCONTD: ARIPiprazole (ABILIFY) tablet 5 mg   DISCONTD: buPROPion (WELLBUTRIN XL) 24 hr tablet 300 mg   DISCONTD: busPIRone (BUSPAR) tablet 15 mg   DISCONTD: venlafaxine XR (EFFEXOR-XR) 24 hr capsule 75 mg   DISCONTD: lisinopril (ZESTRIL) tablet 5 mg   DISCONTD: hydrOXYzine (ATARAX) tablet 50 mg   DISCONTD: famotidine (PEPCID) tablet 40 mg   potassium chloride 10 MEQ/100ML IVPB    Lesly Dukes M: cabinet override   dicyclomine (BENTYL) 10 MG/5ML solution    Sig: Take 5 mLs (10 mg total) by mouth 3 (three) times daily before meals.    Dispense:  240 mL    Refill:  12   sucralfate (CARAFATE) 1 g tablet    Sig: Take 1 tablet (1 g total) by mouth 4 (four) times daily.    Dispense:  120 tablet     Refill:  1   traZODone (DESYREL) 50 MG tablet    Sig: Take 1 tablet (50 mg total) by mouth at bedtime as needed for sleep.    Dispense:  30 tablet    Refill:  0   prochlorperazine (COMPAZINE) 5 MG tablet    Sig: Take 2 tablets (10 mg total) by mouth every 8 (eight) hours as needed for up to 5 days for nausea or vomiting.    Dispense:  30 tablet    Refill:  0    I have reviewed the patients home medicines and have made adjustments as needed  Problem List / ED Course: Problem List Items Addressed This Visit       Digestive   * (Principal) Intractable nausea and vomiting - Primary                Final Clinical Impression(s) / ED Diagnoses Final diagnoses:  None    Rx / DC Orders ED Discharge Orders     None         Shon Baton, MD 05/07/21 [phone]

## 2021-05-04 ENCOUNTER — Encounter (HOSPITAL_COMMUNITY): Payer: Self-pay | Admitting: Internal Medicine

## 2021-05-04 ENCOUNTER — Encounter (HOSPITAL_COMMUNITY)
Admission: EM | Disposition: A | Discharge status: Home / Self Care | Payer: Self-pay | Source: Home / Self Care | Attending: Emergency Medicine

## 2021-05-04 ENCOUNTER — Observation Stay (HOSPITAL_COMMUNITY): Payer: BC Managed Care – PPO | Admitting: Anesthesiology

## 2021-05-04 DIAGNOSIS — R112 Nausea with vomiting, unspecified: Secondary | ICD-10-CM | POA: Diagnosis not present

## 2021-05-04 HISTORY — PX: BIOPSY: SHX5522

## 2021-05-04 HISTORY — PX: ESOPHAGOGASTRODUODENOSCOPY: SHX5428

## 2021-05-04 LAB — COMPREHENSIVE METABOLIC PANEL
ALT: 22 U/L (ref 0–44)
AST: 19 U/L (ref 15–41)
Albumin: 4 g/dL (ref 3.5–5.0)
Alkaline Phosphatase: 71 U/L (ref 38–126)
Anion gap: 7 (ref 5–15)
BUN: 18 mg/dL (ref 6–20)
CO2: 26 mmol/L (ref 22–32)
Calcium: 8.8 mg/dL — ABNORMAL LOW (ref 8.9–10.3)
Chloride: 105 mmol/L (ref 98–111)
Creatinine, Ser: 0.94 mg/dL (ref 0.44–1.00)
GFR, Estimated: >60 mL/min (ref >60)
Glucose, Bld: 118 mg/dL — ABNORMAL HIGH (ref 70–99)
Potassium: 3 mmol/L — ABNORMAL LOW (ref 3.5–5.1)
Sodium: 138 mmol/L (ref 135–145)
Total Bilirubin: 0.7 mg/dL (ref 0.3–1.2)
Total Protein: 6.7 g/dL (ref 6.5–8.1)

## 2021-05-04 LAB — CBC
HCT: 42.9 % (ref 36.0–46.0)
Hemoglobin: 14.1 g/dL (ref 12.0–15.0)
MCH: 29.9 pg (ref 26.0–34.0)
MCHC: 32.9 g/dL (ref 30.0–36.0)
MCV: 90.9 fL (ref 80.0–100.0)
Platelets: 240 10*3/uL (ref 150–400)
RBC: 4.72 MIL/uL (ref 3.87–5.11)
RDW: 13.7 % (ref 11.5–15.5)
WBC: 7.7 10*3/uL (ref 4.0–10.5)
nRBC: 0 % (ref 0.0–0.2)

## 2021-05-04 LAB — HEMOGLOBIN A1C
Hgb A1c MFr Bld: 5.1 % (ref 4.8–5.6)
Mean Plasma Glucose: 99.67 mg/dL

## 2021-05-04 LAB — SURGICAL PCR SCREEN
MRSA, PCR: NEGATIVE
Staphylococcus aureus: POSITIVE — AB

## 2021-05-04 SURGERY — EGD (ESOPHAGOGASTRODUODENOSCOPY)
Anesthesia: Monitor Anesthesia Care

## 2021-05-04 MED ORDER — PROPOFOL 500 MG/50ML IV EMUL
INTRAVENOUS | Status: DC | PRN
  Administered 2021-05-04: 150 ug/kg/min via INTRAVENOUS

## 2021-05-04 MED ORDER — ALPRAZOLAM 1 MG PO TABS
1.0000 mg | ORAL_TABLET | Freq: Four times a day (QID) | ORAL | Status: DC | PRN
  Administered 2021-05-04 – 2021-05-05 (×3): 1 mg via ORAL
  Filled 2021-05-04 (×3): qty 1

## 2021-05-04 MED ORDER — PROPOFOL 10 MG/ML IV BOLUS
INTRAVENOUS | Status: DC | PRN
  Administered 2021-05-04: 70 mg via INTRAVENOUS

## 2021-05-04 MED ORDER — LISINOPRIL 5 MG PO TABS
5.0000 mg | ORAL_TABLET | Freq: Every day | ORAL | Status: DC
  Administered 2021-05-04 – 2021-05-05 (×2): 5 mg via ORAL
  Filled 2021-05-04 (×2): qty 1

## 2021-05-04 MED ORDER — ESMOLOL HCL 100 MG/10ML IV SOLN
INTRAVENOUS | Status: DC | PRN
Start: 2021-05-04 — End: 2021-05-04
  Administered 2021-05-04: 40 mg via INTRAVENOUS

## 2021-05-04 MED ORDER — MUPIROCIN 2 % EX OINT
1.0000 "application " | TOPICAL_OINTMENT | Freq: Two times a day (BID) | CUTANEOUS | Status: DC
  Administered 2021-05-04 – 2021-05-05 (×2): 1 via NASAL
  Filled 2021-05-04: qty 22

## 2021-05-04 MED ORDER — POTASSIUM CHLORIDE IN NACL 40-0.9 MEQ/L-% IV SOLN
INTRAVENOUS | Status: DC
  Filled 2021-05-04 (×3): qty 1000

## 2021-05-04 MED ORDER — FAMOTIDINE 20 MG PO TABS
40.0000 mg | ORAL_TABLET | Freq: Every day | ORAL | Status: DC
  Administered 2021-05-04 – 2021-05-05 (×2): 40 mg via ORAL
  Filled 2021-05-04 (×2): qty 2

## 2021-05-04 MED ORDER — METOCLOPRAMIDE HCL 5 MG/ML IJ SOLN
5.0000 mg | Freq: Three times a day (TID) | INTRAMUSCULAR | Status: DC
  Administered 2021-05-04 – 2021-05-05 (×4): 5 mg via INTRAVENOUS
  Filled 2021-05-04 (×4): qty 2

## 2021-05-04 MED ORDER — ONDANSETRON 4 MG PO TBDP
4.0000 mg | ORAL_TABLET | Freq: Once | ORAL | Status: AC
  Administered 2021-05-04: 4 mg via ORAL
  Filled 2021-05-04: qty 1

## 2021-05-04 MED ORDER — LABETALOL HCL 5 MG/ML IV SOLN
INTRAVENOUS | Status: AC
  Filled 2021-05-04: qty 4

## 2021-05-04 MED ORDER — VENLAFAXINE HCL ER 75 MG PO CP24
75.0000 mg | ORAL_CAPSULE | Freq: Every day | ORAL | Status: DC
  Administered 2021-05-05: 75 mg via ORAL
  Filled 2021-05-04: qty 1

## 2021-05-04 MED ORDER — BUSPIRONE HCL 5 MG PO TABS
15.0000 mg | ORAL_TABLET | Freq: Two times a day (BID) | ORAL | Status: DC
  Administered 2021-05-04 – 2021-05-05 (×2): 15 mg via ORAL
  Filled 2021-05-04 (×2): qty 3

## 2021-05-04 MED ORDER — HYDROXYZINE HCL 25 MG PO TABS
50.0000 mg | ORAL_TABLET | Freq: Two times a day (BID) | ORAL | Status: DC
  Administered 2021-05-04 – 2021-05-05 (×2): 50 mg via ORAL
  Filled 2021-05-04 (×2): qty 2

## 2021-05-04 MED ORDER — LABETALOL HCL 5 MG/ML IV SOLN
10.0000 mg | Freq: Once | INTRAVENOUS | Status: AC
  Administered 2021-05-04: 10 mg via INTRAVENOUS

## 2021-05-04 MED ORDER — LIDOCAINE HCL (CARDIAC) PF 100 MG/5ML IV SOSY: 0.5000 mg/kg | PREFILLED_SYRINGE | Freq: Once | INTRAVENOUS | Status: DC

## 2021-05-04 MED ORDER — ARIPIPRAZOLE 5 MG PO TABS
5.0000 mg | ORAL_TABLET | Freq: Every day | ORAL | Status: DC
  Administered 2021-05-04: 5 mg via ORAL
  Filled 2021-05-04 (×2): qty 1

## 2021-05-04 MED ORDER — CHLORHEXIDINE GLUCONATE CLOTH 2 % EX PADS
6.0000 | MEDICATED_PAD | Freq: Every day | CUTANEOUS | Status: DC
  Administered 2021-05-05: 6 via TOPICAL

## 2021-05-04 MED ORDER — LACTATED RINGERS IV SOLN
INTRAVENOUS | Status: DC | PRN
Start: 2021-05-04 — End: 2021-05-04

## 2021-05-04 MED ORDER — LIDOCAINE 2% (20 MG/ML) 5 ML SYRINGE
INTRAMUSCULAR | Status: DC | PRN
  Administered 2021-05-04: 40 mg via INTRAVENOUS

## 2021-05-04 MED ORDER — LORAZEPAM 2 MG/ML IJ SOLN
0.5000 mg | Freq: Once | INTRAMUSCULAR | Status: AC
  Administered 2021-05-04: 0.5 mg via INTRAVENOUS
  Filled 2021-05-04: qty 1

## 2021-05-04 MED ORDER — BUPROPION HCL ER (XL) 300 MG PO TB24
300.0000 mg | ORAL_TABLET | Freq: Every morning | ORAL | Status: DC
Start: 2021-05-05 — End: 2021-05-05
  Administered 2021-05-05: 300 mg via ORAL
  Filled 2021-05-04: qty 1

## 2021-05-04 MED ORDER — ONDANSETRON HCL 4 MG/2ML IJ SOLN
4.0000 mg | Freq: Four times a day (QID) | INTRAMUSCULAR | Status: DC | PRN
  Administered 2021-05-05: 4 mg via INTRAVENOUS
  Filled 2021-05-04: qty 2

## 2021-05-04 MED ORDER — POTASSIUM CHLORIDE 10 MEQ/100ML IV SOLN
INTRAVENOUS | Status: AC
  Filled 2021-05-04: qty 100

## 2021-05-04 MED ORDER — POTASSIUM CHLORIDE 10 MEQ/100ML IV SOLN
10.0000 meq | INTRAVENOUS | Status: AC
  Administered 2021-05-04 (×3): 10 meq via INTRAVENOUS
  Filled 2021-05-04 (×2): qty 100

## 2021-05-04 NOTE — Anesthesia Postprocedure Evaluation (Signed)
Anesthesia Post Note  Patient: [place]  Procedure(s) Performed: ESOPHAGOGASTRODUODENOSCOPY (EGD) BIOPSY     Patient location during evaluation: Endoscopy Anesthesia Type: MAC Level of consciousness: oriented, awake and alert and awake Pain management: pain level controlled Vital Signs Assessment: post-procedure vital signs reviewed and stable Respiratory status: spontaneous breathing, nonlabored ventilation, respiratory function stable and patient connected to nasal cannula oxygen Cardiovascular status: blood pressure returned to baseline and stable Postop Assessment: no headache, no backache and no apparent nausea or vomiting Anesthetic complications: no   No notable events documented.  Last Vitals:  Vitals:   05/04/21 1727 05/04/21 1826  BP: (!) 148/94 (!) 148/75  Pulse: 87 82  Resp: 16 16  Temp: 37.2 C 36.8 C  SpO2: 99% 95%    Last Pain:  Vitals:   05/04/21 1826  TempSrc: Oral  PainSc:                  Santa Lighter

## 2021-05-04 NOTE — Transfer of Care (Signed)
Immediate Anesthesia Transfer of Care Note  Patient: Monique Shaw [place]  Procedure(s) Performed: ESOPHAGOGASTRODUODENOSCOPY (EGD) BIOPSY  Patient Location: PACU  Anesthesia Type:MAC  Level of Consciousness: awake, alert , oriented and patient cooperative  Airway & Oxygen Therapy: Patient Spontanous Breathing and Patient connected to face mask oxygen  Post-op Assessment: Report given to RN and Post -op Vital signs reviewed and stable  Post vital signs: Reviewed and stable  Last Vitals:  Vitals Value Taken Time  BP    Temp    Pulse 95 05/04/21 1413  Resp 22 05/04/21 1413  SpO2 100 % 05/04/21 1413  Vitals shown include unvalidated device data.  Last Pain:  Vitals:   05/04/21 1254  TempSrc:   PainSc: 0-No pain      Patients Stated Pain Goal: 3 (03/52/48 1859)  Complications: No notable events documented.

## 2021-05-04 NOTE — Progress Notes (Signed)
Received patient back from [facility]. Alert and Oriented x 4. Vital signs taken. Call bell in reach. Will continue to monitor.

## 2021-05-04 NOTE — Progress Notes (Addendum)
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   ParaGard IUD Peripheral nerve eval-Interstim for refractory bowel incontinence 07/29/20 Migraines/OSA/CPAP 5 cm   ? [facility] note 03/08/21 states otherwise?--BMI 42 followed by Dr. Frances Furbish neurology?  [facility] GERD symptoms told to take Carafate PPI back in 2021 by Mr. Randon Goldsmith of GI  Recent hospitalization 2/7--04/29/2021-intractable N/V2/2?  Ozempic  Discharged home Reglan/Compazine-came back to the ED 2/15 with n/v abd pain [facility] consulted after noting WNL CBC, CMP, lipase, CT ABD = fatty liver no acute  Hospital-Problem based course  N/V 2/2 Erosive gastritis History of being GI shows erosive gastritis-she is aware of precautions to take, PPI 40 IV twice daily today, Carafate 1 g 3 times daily Clear liquid diet today or other as per GI She can continue Compazine 10 every 6 as needed as she was having further nausea I gave her a dose of Zofran ODT and for breakthrough can use 4 mg every 6 as needed of Zofran IV History of fecal incontinence with pelvic floor incontinence status post peripheral nerve InterStim 07/29/2020 Outpatient follow-up with her interventional list Hypokalemia on admission Replaced with IV rounds and with LR containing NaCl +40 of KX 24 hours-repeat a.m. labs + mag IBS-D/ADHD/anxiety Continue Bentyl 10 3 times daily, Reglan 5 every 8 Outpatient follow-up as described above I have resumed her Xanax 1 mg 4 times daily as needed, Abilify 5 nightly, BuSpar 15 twice daily, Wellbutrin XL 300 a.m. 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   OSA saw [facility] for sleep eval 02/2021-she should follow-up \ for this as OP Chronic hep B Seen by GI at [facility]--unclear if treated BMI 42 class II obesity A1c 5.1 Needs outpatient counseling-would not use Ozempic in the future as this seemed to have caused issues on last hospitalization just recently Will need dedicated medical nutritionist/weight loss physician supervision in terms of gradual weight loss-it appears on chart review she lost 100 pounds about 5 years ago and was able to keep that off for some time Patient does not need diabetes meds but does need weight loss counseling Care fragmentation affecting care she has been through this before-her care is fragmented and she should establish care either within the [facility] system or in the [facility] system--we will ask her preference tomorrow  DVT prophylaxis: SCD Code Status: Full Family Communication: No family present Disposition:  Status is: Observation The patient remains OBS appropriate and will d/c before 2 midnights.         Consultants:  Gastroenterology  Procedures:   EGD 05/04/2021 EGD showed LA grade C esophagitis, medium size hiatal hernia and gastritis.  Biopsies taken.  Small amount of retained fluid in the stomach.  Antimicrobials: n    Subjective: Awake coherent pleasant no distress Seen prior to procedure she does not like the potassium runs as a burning a little bit but otherwise she is fine-no nausea-no vomiting today GI held the Reglan earlier this morning which she was asking about When I saw her in the afternoon she was still having some nausea so I gave her a dose of ODT Zofran in addition to her  Compazine She does have an underlying diagnosis of IBS-D  Objective: Vitals:   05/04/21 1500 05/04/21 1528 05/04/21 1628 05/04/21 1727  BP: (!) 153/103 (!) 151/103 (!) 164/92 (!) 148/94  Pulse:  72 72 87  Resp: (!) 21 15 16 16   Temp:  98.7 F (37.1 C) 98.6 F (37 C) 98.9 F  (37.2 C)  TempSrc:   Oral Oral  SpO2: 96% 99%  99%  Weight:      Height:        Intake/Output Summary (Last 24 hours) at 05/04/2021 1756 Last data filed at 05/04/2021 1406 Gross per 24 hour  Intake 272.92 ml  Output 0 ml  Net 272.92 ml   Filed Weights   05/03/21 1759  Weight: 131.5 kg    Examination:  Pleasant obese black female no apparent distress Mallampati 4 Neck soft supple S1-S2 no murmur Abdomen obese slightly tender lower quadrant cannot appreciate HSM No lower extremity edema ROM intact moving 4 limbs equally-sensory deferred Psych euthymic   Data Reviewed: personally reviewed   CBC    Component Value Date/Time   WBC 7.7 05/04/2021 0403   RBC 4.72 05/04/2021 0403   HGB 14.1 05/04/2021 0403   HCT 42.9 05/04/2021 0403   PLT 240 05/04/2021 0403   MCV 90.9 05/04/2021 0403   MCV 97.0 01/11/2013 1132   MCH 29.9 05/04/2021 0403   MCHC 32.9 05/04/2021 0403   RDW 13.7 05/04/2021 0403   LYMPHSABS 1.6 04/28/2021 0548   MONOABS 0.6 04/28/2021 0548   EOSABS 0.0 04/28/2021 0548   BASOSABS 0.0 04/28/2021 0548   CMP Latest Ref Rng & Units 05/04/2021 05/02/2021 04/28/2021  Glucose 70 - 99 mg/dL 06/26/2021) 767(H) 419(F)  BUN 6 - 20 mg/dL 18 16 11   Creatinine 0.44 - 1.00 mg/dL 790(W ) 4.09  Sodium 135 - 145 mmol/L 138 133(L) 136  Potassium 3.5 - 5.1 mmol/L 3.0(L) 3.5 3.0(L)  Chloride 98 - 111 mmol/L 105 101 100  CO2 22 - 32 mmol/L 26 23 28   Calcium 8.9 - 10.3 mg/dL 7.35(H) 9.1 2.99)  Total Protein 6.5 - 8.1 g/dL 6.7 7.1 6.6  Total Bilirubin 0.3 - 1.2 mg/dL 0.7 0.7 0.7  Alkaline Phos 38 - 126 U/L 71 86 72  AST 15 - 41 U/L 19 18 17   ALT 0 - 44 U/L 22 18 14      Radiology Studies: No results found.   Scheduled Meds:  ARIPiprazole  5 mg Oral QHS   [START ON 05/05/2021] buPROPion  300 mg Oral q morning   busPIRone  15 mg Oral BID   [START ON 05/05/2021] Chlorhexidine Gluconate Cloth  6 each Topical Q0600   dicyclomine  10 mg Oral TID AC   famotidine  40 mg  Oral Daily   hydrOXYzine  50 mg Oral BID   lisinopril  5 mg Oral Daily   metoCLOPramide (REGLAN) injection  5 mg Intravenous Q8H   mupirocin ointment  1 application Nasal BID   pantoprazole (PROTONIX) IV  40 mg Intravenous Q12H   sucralfate  1 g Oral TID WC & HS   [START ON 05/05/2021] venlafaxine XR  75 mg Oral Q breakfast   Continuous Infusions:  0.9 % NaCl with KCl 40 mEq / L 75 mL/hr at 05/04/21 1045     LOS: 0 days   Time spent: 56  05/07/2021, MD [facility] To contact the attending provider between 7A-7P or the covering provider during after hours 7P-7A, please log into the web site  [email or web address] and access using universal [place] password for that web site. If you do not have the password, please call the hospital operator.  05/04/2021, 5:56 PM

## 2021-05-04 NOTE — Progress Notes (Signed)
°  Transition of Care [facility]) Screening Note   Patient Details  Name: Monique Shaw Date of Birth: Jul 11, 1980   Transition of Care [facility]) CM/SW Contact:    Amada Jupiter, LCSW Phone Number: 05/04/2021, 12:11 PM    Transition of Care Department [facility]) has reviewed patient and no TOC needs have been identified at this time. We will continue to monitor patient advancement through interdisciplinary progression rounds. If new patient transition needs arise, please place a TOC consult.

## 2021-05-04 NOTE — Progress Notes (Signed)
S/p EGD, took one sip sprite, vomited >185ml yellow fluid, followed by ~50cc yellowish fluid.  DBP>100 upon admission to [facility] and remained >100 with subsequent Bps. Dr Gifford Shave ordered Labetalol 10mg  IV

## 2021-05-04 NOTE — Op Note (Signed)
[facility] Patient Name: Monique Shaw Procedure Date: 05/04/2021 MRN: [id number] Attending MD: Kathi Der , MD Date of Birth: January 26, 1981 [name]: [id number] Age: 41 Admit Type: Inpatient Procedure:                Upper GI endoscopy Indications:              Nausea with vomiting Providers:                Kathi Der, MD, Adolph Pollack, RN, Salley Scarlet, Technician, Anastasio Champion, CRNA Referring MD:              Medicines:                Sedation Administered by an Anesthesia Professional Complications:            No immediate complications. Estimated Blood Loss:     Estimated blood loss was minimal. Procedure:                Pre-Anesthesia Assessment:                           - Prior to the procedure, a History and Physical                            was performed, and patient medications and                            allergies were reviewed. The patient's tolerance of                            previous anesthesia was also reviewed. The risks                            and benefits of the procedure and the sedation                            options and risks were discussed with the patient.                            All questions were answered, and informed consent                            was obtained. Prior Anticoagulants: The patient has                            taken no previous anticoagulant or antiplatelet                            agents. ASA Grade Assessment: III - A patient with                            severe systemic disease. After reviewing the risks  and benefits, the patient was deemed in                            satisfactory condition to undergo the procedure.                           After obtaining informed consent, the endoscope was                            passed under direct vision. Throughout the                            procedure, the patient's blood  pressure, pulse, and                            oxygen saturations were monitored continuously. The                            [id number]-H190 ([id number]) Olympus endoscope was introduced                            through the mouth, and advanced to the second part                            of duodenum. The upper GI endoscopy was                            accomplished without difficulty. The patient                            tolerated the procedure well. Scope In: Scope Out: Findings:      LA Grade C (one or more mucosal breaks continuous between tops of 2 or       more mucosal folds, less than 75% circumference) esophagitis with no       bleeding was found in the distal esophagus.      A medium-sized hiatal hernia was present.      Scattered mild inflammation characterized by congestion (edema) and       erythema was found in the entire examined stomach. Biopsies were taken       with a cold forceps for histology.      The cardia and gastric fundus were normal on retroflexion.      Retained fluid was found in the stomach.      The duodenal bulb, first portion of the duodenum and second portion of       the duodenum were normal. Impression:               - LA Grade C erosive esophagitis with no bleeding.                           - Medium-sized hiatal hernia.                           - Gastritis. Biopsied.                           -  Retained gastric fluid.                           - Normal duodenal bulb, first portion of the                            duodenum and second portion of the duodenum. Moderate Sedation:      Moderate (conscious) sedation was personally administered by an       anesthesia professional. The following parameters were monitored: oxygen       saturation, heart rate, blood pressure, and response to care. Recommendation:           - Return patient to hospital ward for ongoing care.                           - Resume previous diet.                           -  Continue present medications.                           - Await pathology results.                           - Repeat upper endoscopy in 3 months to check                            healing. Procedure Code(s):        --- Professional ---                           [phone], Esophagogastroduodenoscopy, flexible,                            transoral; with biopsy, single or multiple Diagnosis Code(s):        --- Professional ---                           K20.80, Other esophagitis without bleeding                           K44.9, Diaphragmatic hernia without obstruction or                            gangrene                           K29.70, Gastritis, unspecified, without bleeding                           R11.2, Nausea with vomiting, unspecified CPT copyright 2019 American Medical Association. All rights reserved. The codes documented in this report are preliminary and upon coder review may  be revised to meet current compliance requirements. Kathi Der, MD Kathi Der, MD 05/04/2021 2:09:36 PM Number of Addenda: 0

## 2021-05-04 NOTE — Plan of Care (Signed)

## 2021-05-04 NOTE — Anesthesia Preprocedure Evaluation (Signed)
Anesthesia Evaluation  Patient identified by MRN, date of birth, ID band Patient awake    Reviewed: Allergy & Precautions, NPO status , Patient's Chart, lab work & pertinent test results  Airway Mallampati: III  TM Distance: >3 FB Neck ROM: Full    Dental  (+) Teeth Intact, Dental Advisory Given   Pulmonary asthma , sleep apnea ,    Pulmonary exam normal breath sounds clear to auscultation       Cardiovascular hypertension, Pt. on medications Normal cardiovascular exam Rhythm:Regular Rate:Normal     Neuro/Psych  Headaches, PSYCHIATRIC DISORDERS Anxiety Depression    GI/Hepatic Neg liver ROS, GERD  Medicated,  Endo/Other  Morbid obesity  Renal/GU negative Renal ROS     Musculoskeletal negative musculoskeletal ROS (+)   Abdominal   Peds  Hematology negative hematology ROS (+)   Anesthesia Other Findings Day of surgery medications reviewed with the patient.  Reproductive/Obstetrics                             Anesthesia Physical Anesthesia Plan  ASA: 3  Anesthesia Plan: MAC   Post-op Pain Management:    Induction: Intravenous  PONV Risk Score and Plan: 2 and Treatment may vary due to age or medical condition and TIVA  Airway Management Planned: Nasal Cannula  Additional Equipment:   Intra-op Plan:   Post-operative Plan:   Informed Consent: I have reviewed the patients History and Physical, chart, labs and discussed the procedure including the risks, benefits and alternatives for the proposed anesthesia with the patient or authorized representative who has indicated his/her understanding and acceptance.     Dental advisory given  Plan Discussed with: CRNA and Anesthesiologist  Anesthesia Plan Comments:         Anesthesia Quick Evaluation

## 2021-05-04 NOTE — Brief Op Note (Addendum)
05/03/2021 - 05/04/2021  2:28 PM  PATIENT:  Monique Shaw  41 y.o. female  PRE-OPERATIVE DIAGNOSIS:  intractable nausea and vomiting  POST-OPERATIVE DIAGNOSIS:  GASTRIC BX Esophagitis Hiatel henia  PROCEDURE:  Procedure(s): ESOPHAGOGASTRODUODENOSCOPY (EGD) (N/A) BIOPSY  SURGEON:  Surgeon(s) and Role:    * August Gosser, MD - Primary  Findings ----------- -EGD showed LA grade C esophagitis, medium size hiatal hernia and gastritis.  Biopsies taken.  Small amount of retained fluid in the stomach.  Recommendations -------------------------- -Continue twice daily IV PPI while in the hospital. -Recommend discharging patient on Protonix 40 mg twice a day for 2 months followed by Protonix 40 mg once a day  -Recommend Carafate twice a day on discharge for 4 weeks. -Recommend IV Reglan 5 mg 3 times a day while in hospital. -Patient was advised to follow-up with her primary gastroenterologist to repeat EGD in 3 months to document healing of esophagitis. -Avoid alcohol, NSAIDs and smoking. -Start full liquid diet and slowly advance as tolerated. -No further inpatient GI work-up planned.  GI will sign off.  Call us back if needed  Kathi Der MD, FACP 05/04/2021, 2:29 PM  Contact #  [phone]

## 2021-05-04 NOTE — Interval H&P Note (Signed)
History and Physical Interval Note:  05/04/2021 1:29 PM  Monique Shaw Duke Salvia  has presented today for surgery, with the diagnosis of intractable nausea and vomiting.  The various methods of treatment have been discussed with the patient and family. After consideration of risks, benefits and other options for treatment, the patient has consented to  Procedure(s): ESOPHAGOGASTRODUODENOSCOPY (EGD) (N/A) as a surgical intervention.  The patient's history has been reviewed, patient examined, no change in status, stable for surgery.  I have reviewed the patient's chart and labs.  Questions were answered to the patient's satisfaction.     Costa Jha

## 2021-05-04 NOTE — Hospital Course (Signed)
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   ParaGard IUD Migraines/OSA/CPAP 5 cm and came off of it because of significant weight loss ---BMI 42 followed by Dr. Frances Furbish neurology Recent hospitalization 2/7--04/29/2021-intractable N/V2/2?  Ozempic  Discharged home Reglan/Compazine-came back to the ED 2/15 [facility] consulted after noting WNL CBC, CMP, lipase, CT ABD = fatty liver no acute

## 2021-05-05 ENCOUNTER — Encounter (HOSPITAL_COMMUNITY): Payer: Self-pay | Admitting: Gastroenterology

## 2021-05-05 ENCOUNTER — Encounter: Payer: Self-pay | Admitting: Family Medicine

## 2021-05-05 DIAGNOSIS — Z20822 Contact with and (suspected) exposure to covid-19: Secondary | ICD-10-CM | POA: Diagnosis present

## 2021-05-05 DIAGNOSIS — R159 Full incontinence of feces: Secondary | ICD-10-CM | POA: Diagnosis present

## 2021-05-05 DIAGNOSIS — Z79899 Other long term (current) drug therapy: Secondary | ICD-10-CM | POA: Diagnosis not present

## 2021-05-05 DIAGNOSIS — F909 Attention-deficit hyperactivity disorder, unspecified type: Secondary | ICD-10-CM | POA: Diagnosis present

## 2021-05-05 DIAGNOSIS — G43909 Migraine, unspecified, not intractable, without status migrainosus: Secondary | ICD-10-CM | POA: Diagnosis present

## 2021-05-05 DIAGNOSIS — J45909 Unspecified asthma, uncomplicated: Secondary | ICD-10-CM | POA: Diagnosis present

## 2021-05-05 DIAGNOSIS — Z6841 Body Mass Index (BMI) 40.0 and over, adult: Secondary | ICD-10-CM | POA: Diagnosis not present

## 2021-05-05 DIAGNOSIS — K2960 Other gastritis without bleeding: Secondary | ICD-10-CM | POA: Diagnosis present

## 2021-05-05 DIAGNOSIS — F419 Anxiety disorder, unspecified: Secondary | ICD-10-CM | POA: Diagnosis present

## 2021-05-05 DIAGNOSIS — E785 Hyperlipidemia, unspecified: Secondary | ICD-10-CM | POA: Diagnosis present

## 2021-05-05 DIAGNOSIS — E876 Hypokalemia: Secondary | ICD-10-CM | POA: Diagnosis present

## 2021-05-05 DIAGNOSIS — I10 Essential (primary) hypertension: Secondary | ICD-10-CM | POA: Diagnosis present

## 2021-05-05 DIAGNOSIS — Z8249 Family history of ischemic heart disease and other diseases of the circulatory system: Secondary | ICD-10-CM | POA: Diagnosis not present

## 2021-05-05 DIAGNOSIS — K760 Fatty (change of) liver, not elsewhere classified: Secondary | ICD-10-CM | POA: Diagnosis present

## 2021-05-05 DIAGNOSIS — R112 Nausea with vomiting, unspecified: Secondary | ICD-10-CM | POA: Diagnosis not present

## 2021-05-05 DIAGNOSIS — G4733 Obstructive sleep apnea (adult) (pediatric): Secondary | ICD-10-CM | POA: Diagnosis present

## 2021-05-05 DIAGNOSIS — K219 Gastro-esophageal reflux disease without esophagitis: Secondary | ICD-10-CM | POA: Diagnosis present

## 2021-05-05 DIAGNOSIS — B181 Chronic viral hepatitis B without delta-agent: Secondary | ICD-10-CM | POA: Diagnosis present

## 2021-05-05 DIAGNOSIS — K580 Irritable bowel syndrome with diarrhea: Secondary | ICD-10-CM | POA: Diagnosis present

## 2021-05-05 LAB — COMPREHENSIVE METABOLIC PANEL
ALT: 19 U/L (ref 0–44)
AST: 20 U/L (ref 15–41)
Albumin: 3.5 g/dL (ref 3.5–5.0)
Alkaline Phosphatase: 66 U/L (ref 38–126)
Anion gap: 7 (ref 5–15)
BUN: 14 mg/dL (ref 6–20)
CO2: 24 mmol/L (ref 22–32)
Calcium: 8.2 mg/dL — ABNORMAL LOW (ref 8.9–10.3)
Chloride: 103 mmol/L (ref 98–111)
Creatinine, Ser: 0.98 mg/dL (ref 0.44–1.00)
GFR, Estimated: >60 mL/min (ref >60)
Glucose, Bld: 99 mg/dL (ref 70–99)
Potassium: 4.1 mmol/L (ref 3.5–5.1)
Sodium: 134 mmol/L — ABNORMAL LOW (ref 135–145)
Total Bilirubin: 1.1 mg/dL (ref 0.3–1.2)
Total Protein: 6.4 g/dL — ABNORMAL LOW (ref 6.5–8.1)

## 2021-05-05 LAB — MAGNESIUM: Magnesium: 1.9 mg/dL (ref 1.7–2.4)

## 2021-05-05 MED ORDER — TRAZODONE HCL 50 MG PO TABS: 50.0000 mg | ORAL_TABLET | Freq: Every evening | ORAL | 0 refills | Status: DC | PRN

## 2021-05-05 MED ORDER — DICYCLOMINE HCL 10 MG/5ML PO SOLN: 10.0000 mg | Freq: Three times a day (TID) | ORAL | 12 refills | Status: DC

## 2021-05-05 MED ORDER — PROCHLORPERAZINE MALEATE 5 MG PO TABS: 10.0000 mg | ORAL_TABLET | Freq: Three times a day (TID) | ORAL | 0 refills | Status: DC | PRN

## 2021-05-05 MED ORDER — SUCRALFATE 1 G PO TABS: 1.0000 g | ORAL_TABLET | Freq: Four times a day (QID) | ORAL | 1 refills | Status: DC

## 2021-05-05 NOTE — Discharge Summary (Signed)
Physician Discharge Summary  Monique Shaw WIO:[id number] DOB: 04-12-1980 DOA: 05/03/2021  PCP: Harvie Heck, MD  Admit date: 05/03/2021 Discharge date: 05/05/2021  Time spent: 37 minutes  Recommendations for Outpatient Follow-up:  Requires Chem-12 CBC INR in 1 week New prescriptions Carafate, trazodone, Compazine Recommend close follow-up with her primary gastroenterologist at [facility] in 1 to 2 weeks Recommend down titration of various psychiatric meds by Dr. 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   ParaGard IUD Peripheral nerve eval-Interstim for refractory bowel incontinence 07/29/20 Migraines/OSA/CPAP 5 cm    ? [facility] note 03/08/21 states otherwise?--BMI 42 followed by Dr. Frances Furbish neurology?   [facility] GERD symptoms told to take Carafate PPI back in 2021 by Mr. Randon Goldsmith of GI  Recent hospitalization 2/7--04/29/2021-intractable N/V2/2?  Ozempic   Discharged home Reglan/Compazine-came back to the ED 2/15 with n/v abd pain [facility] consulted after noting WNL CBC, CMP, lipase, CT ABD = fatty liver no acute  Hospital Course:  V 2/2 Erosive gastritis History of being GI shows erosive gastritis-she is aware of precautions to take, PPI 40 IV twice daily today, Carafate 1 g 3  times daily Clear liquid diet may need to continue in the outpatient setting for some time and gradually very slowly as she had severe esophagitis She was offered opportunity to stay until 2/18 but after mutual shared discussions and decision making was okay going home and we prescribed her some Compazine to help with nausea History of fecal incontinence with pelvic floor incontinence status post peripheral nerve InterStim 07/29/2020 Outpatient follow-up with her interventional-list Hypokalemia on admission Replaced with IV rounds and with LR containing NaCl +40 of KX 24 hours-repeat a.m. labs + mag IBS-D/ADHD/anxiety I have resumed her Xanax 1 mg 4 times daily as needed, Abilify 5 nightly, BuSpar 15 twice daily, Wellbutrin XL 300 a.m. 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  OSA saw [facility] for sleep eval 02/2021-she should follow-up \ for this as OP Chronic hep B Seen by GI at [facility]--unclear if treated BMI 42 class II obesity A1c 5.1 Needs outpatient counseling-would not use Ozempic in the future as this seemed to have caused issues on last hospitalization just recently Will need dedicated medical nutritionist/weight loss physician supervision in terms of gradual weight loss-it appears on chart review she lost 100 pounds about 5 years ago and was able to keep that off for some time Patient does not need diabetes meds but does need weight loss counseling Care fragmentation affecting care she has been through this before-her care is fragmented and she should establish care either within the [facility] system or in the [facility]  health system--we will ask her preference tomorrow  Procedures: EGD Findings ----------- -EGD showed LA grade C esophagitis, medium size hiatal hernia and  gastritis.  Biopsies taken.  Small amount of retained fluid in the stomach.   Consultations: [facility]. Georgiann Cocker  Discharge Exam: Vitals:   05/05/21 0555 05/05/21 1418  BP: 128/76 137/84  Pulse: 91 79  Resp: 16 16  Temp: 98.6 F (37 C) 98.3 F (36.8 C)  SpO2: 99% 98%    Subj on day of d/c   Fair no distress some discomfort in her belly at times-going slow Relates she is more comfortable leaving and is happy to have had a shower General Exam on discharge  EOMI NCAT thick neck Mallampati 4 S1-S2 no murmur Mild discomfort in epigastrium obese abdomen No lower extremity edema Chest clear   Discharge Instructions   Discharge Instructions     Diet - low sodium heart healthy   Complete by: As directed    Discharge instructions   Complete by: As directed    Continue all of your psychiatric medications-no refills will be provided at this time and you will have to follow-up with your regular psychiatrist or primary care physician prescribes these However with your nausea, we are prescribing a limited amount of Compazine and at your request I am giving you a limited refill of trazodone You should be stable to go home and graduate your diet very slowly with Carafate tablets as you have done in the past I do not expect you to be having full meals in the next week rather you should go slowly with a soft or liquid diet and then graduate this as per your tolerance I would definitely recommend you consolidate your care at [facility] as most your specialists are there-we of course are happy to see you if there is something emergent however in an effort not to confound or confused your care it would be best that 1 set of physicians sees you longitudinally Please get labs in about 1 week in the outpatient setting You will be given out from work until 05/10/2021   Increase activity slowly   Complete by: As directed       Allergies as of 05/05/2021   No Known Allergies       Medication List     STOP taking these medications    methocarbamol 500 MG tablet Commonly known as: ROBAXIN   Qelbree 200 MG 24 hr capsule Generic drug: viloxazine ER       TAKE these medications    ALPRAZolam 1 MG tablet Commonly known as: XANAX Take 1 mg by mouth 4 (four) times daily as needed for anxiety.   ARIPiprazole 5 MG tablet Commonly known as: ABILIFY Take 5 mg by mouth at bedtime.   atorvastatin 20 MG tablet Commonly known as: LIPITOR Take 20 mg by mouth daily.   Azelastine HCl 137 MCG/SPRAY Soln Place 1 spray into both nostrils daily.   buPROPion 300 MG 24 hr tablet Commonly known as: WELLBUTRIN XL Take 300 mg by mouth every morning.   busPIRone 15 MG tablet Commonly known as: BUSPAR Take 15 mg by mouth 2 (two) times daily.   desvenlafaxine 100 MG 24 hr tablet Commonly known as: PRISTIQ Take 100 mg by mouth daily.   dicyclomine 10 MG/5ML solution Commonly known as: BENTYL Take 5 mLs (10 mg total) by mouth 3 (three) times daily before meals.   famotidine 40 MG tablet Commonly known as: PEPCID Take 40 mg  by mouth daily.   hydrOXYzine 25 MG tablet Commonly known as: ATARAX Take 50 mg by mouth 2 (two) times daily.   levocetirizine 5 MG tablet Commonly known as: XYZAL Take 5 mg by mouth every evening.   lisinopril 5 MG tablet Commonly known as: ZESTRIL Take 5 mg by mouth daily.   metoCLOPramide 5 MG tablet Commonly known as: Reglan Take 1 tablet (5 mg total) by mouth every 6 (six) hours as needed for up to 3 days for refractory nausea / vomiting (use for nausea/vomiting not improved with compazine). What changed: when to take this   montelukast 10 MG tablet Commonly known as: SINGULAIR Take 10 mg by mouth daily.   pantoprazole 40 MG tablet Commonly known as: PROTONIX Take 40 mg by mouth daily.   prochlorperazine 5 MG tablet Commonly known as: COMPAZINE Take 2 tablets (10 mg total) by mouth every 8 (eight) hours as needed for  up to 5 days for nausea or vomiting.   sucralfate 1 g tablet Commonly known as: Carafate Take 1 tablet (1 g total) by mouth 4 (four) times daily.   SUMAtriptan 20 MG/ACT nasal spray Commonly known as: Imitrex Place 1 spray (20 mg total) into the nose every 2 (two) hours as needed for migraine or headache. May repeat in 2 hours if headache persists or recurs.   SUMAtriptan 50 MG tablet Commonly known as: IMITREX Take 1 tablet (50 mg total) by mouth once. May repeat in 2 hours if needed. No more than 2 per 24 hours. No more than 3 times per week.   SUMAtriptan 100 MG tablet Commonly known as: IMITREX Take 100 mg by mouth every 2 (two) hours as needed for migraine or headache.   traZODone 50 MG tablet Commonly known as: DESYREL Take 1 tablet (50 mg total) by mouth at bedtime as needed for sleep.   Vitamin D3 1.25 MG (50000 UT) Caps Take 1 capsule by mouth once a week. wednesday       No Known Allergies    The results of significant diagnostics from this hospitalization (including imaging, microbiology, ancillary and laboratory) are listed below for reference.    Significant Diagnostic Studies: CT ABDOMEN PELVIS W CONTRAST  Result Date: 04/25/2021 CLINICAL DATA:  Abdominal pain. EXAM: CT ABDOMEN AND PELVIS WITH CONTRAST TECHNIQUE: Multidetector CT imaging of the abdomen and pelvis was performed using the standard protocol following bolus administration of intravenous contrast. RADIATION DOSE REDUCTION: This exam was performed according to the departmental dose-optimization program which includes automated exposure control, adjustment of the mA and/or kV according to patient size and/or use of iterative reconstruction technique. CONTRAST:  100mL OMNIPAQUE IOHEXOL 300 MG/ML  SOLN COMPARISON:  None. FINDINGS: Lower chest: No acute abnormality. Hepatobiliary: No focal liver abnormality is seen. Low-attenuation of the liver as can be seen with hepatic steatosis. No gallstones, gallbladder  wall thickening, or biliary dilatation. Pancreas: Unremarkable. No pancreatic ductal dilatation or surrounding inflammatory changes. Spleen: Normal in size without focal abnormality. Adrenals/Urinary Tract: Adrenal glands are unremarkable. Kidneys are normal, without renal calculi, focal lesion, or hydronephrosis. Bladder is unremarkable. Stomach/Bowel: Stomach is within normal limits. No evidence of bowel wall thickening, distention, or inflammatory changes. Appendix is normal. Vascular/Lymphatic: No significant vascular findings are present. No enlarged abdominal or pelvic lymph nodes. Reproductive: Uterus and bilateral adnexa are unremarkable. T-type IUD in the uterus. Other: No abdominal wall hernia or abnormality. No abdominopelvic ascites. Musculoskeletal: No acute osseous abnormality. No aggressive osseous lesion. Mild osteoarthritis of bilateral SI  joints. Degenerative disease with severe disc height loss at L5-S1. Minimal retrolisthesis of L5 on S1. Bilateral foraminal stenosis at L5-S1. Stimulator power pack along the left flank with the lead terminating in the medial aspect of the left gluteus maximus muscle. IMPRESSION: 1. No acute abdominal or pelvic pathology. 2. Hepatic steatosis. Electronically Signed   By: Elige KoHetal  Patel M.D.   On: 04/25/2021 08:55    Microbiology: Recent Results (from the past 240 hour(s))  Resp Panel by RT-PCR (Flu A&B, Covid) Nasopharyngeal Swab     Status: None   Collection Time: 05/03/21  9:13 AM   Specimen: Nasopharyngeal Swab; Nasopharyngeal(NP) swabs in vial transport medium  Result Value Ref Range Status   SARS Coronavirus 2 by RT PCR NEGATIVE NEGATIVE Final    Comment: (NOTE) SARS-CoV-2 target nucleic acids are NOT DETECTED.  The SARS-CoV-2 RNA is generally detectable in upper respiratory specimens during the acute phase of infection. The lowest concentration of SARS-CoV-2 viral copies this assay can detect is 138 copies/mL. A negative result does not preclude  SARS-Cov-2 infection and should not be used as the sole basis for treatment or other patient management decisions. A negative result may occur with  improper specimen collection/handling, submission of specimen other than nasopharyngeal swab, presence of viral mutation(s) within the areas targeted by this assay, and inadequate number of viral copies(<138 copies/mL). A negative result must be combined with clinical observations, patient history, and epidemiological information. The expected result is Negative.  Fact Sheet for Patients:  BloggerCourse.comhttps://www.fda.gov/media/152166/download  Fact Sheet for Healthcare Providers:  SeriousBroker.ithttps://www.fda.gov/media/152162/download  This test is no t yet approved or cleared by the [place] FDA and  has been authorized for detection and/or diagnosis of SARS-CoV-2 by FDA under an Emergency Use Authorization (EUA). This EUA will remain  in effect (meaning this test can be used) for the duration of the COVID-19 declaration under Section 564(b)(1) of the Act, 21 U.S.C.section 360bbb-3(b)(1), unless the authorization is terminated  or revoked sooner.       Influenza A by PCR NEGATIVE NEGATIVE Final   Influenza B by PCR NEGATIVE NEGATIVE Final    Comment: (NOTE) The Xpert Xpress SARS-CoV-2/FLU/RSV plus assay is intended as an aid in the diagnosis of influenza from Nasopharyngeal swab specimens and should not be used as a sole basis for treatment. Nasal washings and aspirates are unacceptable for Xpert Xpress SARS-CoV-2/FLU/RSV testing.  Fact Sheet for Patients: BloggerCourse.comhttps://www.fda.gov/media/152166/download  Fact Sheet for Healthcare Providers: SeriousBroker.ithttps://www.fda.gov/media/152162/download  This test is not yet approved or cleared by the [place] FDA and has been authorized for detection and/or diagnosis of SARS-CoV-2 by FDA under an Emergency Use Authorization (EUA). This EUA will remain in effect (meaning this test can be used) for the duration of  the COVID-19 declaration under Section 564(b)(1) of the Act, 21 U.S.C. section 360bbb-3(b)(1), unless the authorization is terminated or revoked.  Performed at [facility], [address] [place], [place] [id number]   Surgical pcr screen     Status: Abnormal   Collection Time: 05/04/21  4:45 AM   Specimen: Nasal Mucosa; Nasal Swab  Result Value Ref Range Status   MRSA, PCR NEGATIVE NEGATIVE Final   Staphylococcus aureus POSITIVE (A) NEGATIVE Final    Comment: (NOTE) The Xpert SA Assay (FDA approved for NASAL specimens in patients 622 years of age and older), is one component of a comprehensive surveillance program. It is not intended to diagnose infection nor to guide or monitor treatment. Performed at [facility],  [address], [place], [place] [address]      Labs: Basic Metabolic Panel: Recent Labs  Lab 05/02/21 2007 05/04/21 0403 05/05/21 0411  NA 133* 138 134*  K 3.5 3.0* 4.1  CL 101 105 103  CO2 23 26 24   GLUCOSE 143* 118* 99  BUN 16 18 14   CREATININE 1.16* 0.94 0.98  CALCIUM 9.1 8.8* 8.2*  MG  --   --  1.9   Liver Function Tests: Recent Labs  Lab 05/02/21 2007 05/04/21 0403 05/05/21 0411  AST 18 19 20   ALT 18 22 19   ALKPHOS 86 71 66  BILITOT 0.7 0.7 1.1  PROT 7.1 6.7 6.4*  ALBUMIN 4.1 4.0 3.5   Recent Labs  Lab 05/02/21 2007  LIPASE 37   No results for input(s): AMMONIA in the last 168 hours. CBC: Recent Labs  Lab 05/02/21 2007 05/04/21 0403  WBC 8.2 7.7  HGB 14.8 14.1  HCT 44.8 42.9  MCV 89.1 90.9  PLT 277 240   Cardiac Enzymes: No results for input(s): CKTOTAL, CKMB, CKMBINDEX, TROPONINI in the last 168 hours. BNP: BNP (last 3 results) No results for input(s): BNP in the last 8760 hours.  ProBNP (last 3 results) No results for input(s): PROBNP in the last 8760 hours.  CBG: No results for input(s): GLUCAP in the last 168 hours.     Signed:  Rhetta Mura MD   [facility] 05/05/2021, 5:14 PM

## 2021-05-05 NOTE — Progress Notes (Signed)
Patient was given discharge instructions, and all questions were answered.  Patient was stable for discharge and was taken to the main exit by wheelchair. 

## 2021-05-05 NOTE — Progress Notes (Unsigned)
{  [facility]_TRH_Note:[id number]} 

## 2021-05-06 ENCOUNTER — Encounter: Payer: Self-pay | Admitting: Emergency Medicine

## 2021-05-06 ENCOUNTER — Ambulatory Visit
Admission: EM | Admit: 2021-05-06 | Discharge: 2021-05-06 | Disposition: A | Discharge status: Home / Self Care | Payer: BC Managed Care – PPO | Attending: Physician Assistant | Admitting: Physician Assistant

## 2021-05-06 ENCOUNTER — Other Ambulatory Visit: Payer: Self-pay

## 2021-05-06 DIAGNOSIS — R112 Nausea with vomiting, unspecified: Secondary | ICD-10-CM

## 2021-05-06 MED ORDER — METOCLOPRAMIDE HCL 5 MG/ML IJ SOLN
5.0000 mg | Freq: Once | INTRAMUSCULAR | Status: AC
  Administered 2021-05-06: 5 mg via INTRAMUSCULAR

## 2021-05-06 NOTE — ED Provider Notes (Signed)
[name]    [name]: [id number] Arrival date & time: 05/06/21  1051      History   Chief Complaint Chief Complaint  Patient presents with   Vomiting    HPI Monique Shaw is a 41 y.o. female.   Patient here today for evaluation of continued nausea and vomiting.  She states that she was evaluated in the hospital yesterday and was released after EGD with findings of esophagitis.  She reports that they prescribed her oral Compazine but she has not been able to keep this down.  She is requesting injection of Reglan as this seems to be the only effective medication.  She has not been able to tolerate Phenergan or Zofran.  She denies any abdominal pain.  She has not had fever.    Past Medical History:  Diagnosis Date   Allergy    Anxiety    ASCVD (arteriosclerotic cardiovascular disease) 2011   Asthma    Blood type, Rh negative    Depression    GERD (gastroesophageal reflux disease)    H/O migraine 10/25/10   H/O toxoplasmosis    H/O varicella    pt denies    History of elevated lipids 2011   Hx: UTI (urinary tract infection) 2007   Hypertension    IBS (irritable bowel syndrome)    Irregular menstrual cycle 2004   Migraine    Obese    Sleep apnea    Weight gain 2004    Patient Active Problem List   Diagnosis Date Noted   Hyperglycemia 05/03/2021   Obesity, Class III, BMI 40-49.9 (morbid obesity) ([place]) 05/03/2021   Hypokalemia 04/29/2021   Hiccups 04/28/2021   Nausea & vomiting 04/27/2021   Pyuria 04/26/2021   Intractable nausea and vomiting 04/25/2021   Essential hypertension 04/25/2021   Asthma 04/25/2021   Pleurisy 08/09/2016   Acute bronchitis 08/09/2016   OSA (obstructive sleep apnea) 08/13/2012   Anxiety 07/14/2012   Depression 07/14/2012   Hyperlipidemia 07/14/2012   Headache(784.0) 07/14/2012   Allergic rhinitis 07/14/2012   Unspecified vitamin D deficiency 07/14/2012   Family history of colon cancer 07/14/2012    Past  Surgical History:  Procedure Laterality Date   BIOPSY  05/04/2021   Procedure: BIOPSY;  Surgeon: Otis Brace, MD;  Location: [facility];  Service: Gastroenterology;;   ESOPHAGOGASTRODUODENOSCOPY N/A 05/04/2021   Procedure: ESOPHAGOGASTRODUODENOSCOPY (EGD);  Surgeon: Otis Brace, MD;  Location: [facility];  Service: Gastroenterology;  Laterality: N/A;   gastroenteritis     WISDOM TOOTH EXTRACTION      OB History     Gravida  1   Para  1   Term  1   Preterm      AB      Living  1      SAB      IAB      Ectopic      Multiple      Live Births  1            Home Medications    Prior to Admission medications   Medication Sig Start Date End Date Taking? Authorizing Provider  ALPRAZolam Duanne Moron) 1 MG tablet Take 1 mg by mouth 4 (four) times daily as needed for anxiety. 02/20/21   [provider]  ARIPiprazole (ABILIFY) 5 MG tablet Take 5 mg by mouth at bedtime. 04/05/21   [provider]  atorvastatin (LIPITOR) 20 MG tablet Take 20 mg by mouth daily.    [provider]  Azelastine HCl 137 MCG/SPRAY SOLN Place 1 spray into both nostrils daily. 12/15/20   [provider]  buPROPion (WELLBUTRIN XL) 300 MG 24 hr tablet Take 300 mg by mouth every morning. 03/27/21   [provider]  busPIRone (BUSPAR) 15 MG tablet Take 15 mg by mouth 2 (two) times daily. 03/02/21   [provider]  Cholecalciferol (VITAMIN D3) 1.25 MG (50000 UT) CAPS Take 1 capsule by mouth once a week. wednesday 02/21/21   [provider]  desvenlafaxine (PRISTIQ) 100 MG 24 hr tablet Take 100 mg by mouth daily.    [provider]  dicyclomine (BENTYL) 10 MG/5ML solution Take 5 mLs (10 mg total) by mouth 3 (three) times daily before meals. 05/05/21   Nita Sells, MD  famotidine (PEPCID) 40 MG tablet Take 40 mg by mouth daily. 03/08/21   [provider]  hydrOXYzine (ATARAX) 25 MG tablet Take 50 mg by mouth 2  (two) times daily. 02/20/21   [provider]  levocetirizine (XYZAL) 5 MG tablet Take 5 mg by mouth every evening.    [provider]  lisinopril (ZESTRIL) 5 MG tablet Take 5 mg by mouth daily. 03/08/21   [provider]  metoCLOPramide (REGLAN) 5 MG tablet Take 1 tablet (5 mg total) by mouth every 6 (six) hours as needed for up to 3 days for refractory nausea / vomiting (use for nausea/vomiting not improved with compazine). Patient taking differently: Take 5 mg by mouth every 8 (eight) hours as needed for refractory nausea / vomiting (use for nausea/vomiting not improved with compazine). 04/29/21 05/03/21  Elodia Florence., MD  montelukast (SINGULAIR) 10 MG tablet Take 10 mg by mouth daily. 04/04/21   [provider]  pantoprazole (PROTONIX) 40 MG tablet Take 40 mg by mouth daily. 03/08/21   [provider]  prochlorperazine (COMPAZINE) 5 MG tablet Take 2 tablets (10 mg total) by mouth every 8 (eight) hours as needed for up to 5 days for nausea or vomiting. 05/05/21 05/10/21  Nita Sells, MD  sucralfate (CARAFATE) 1 g tablet Take 1 tablet (1 g total) by mouth 4 (four) times daily. 05/05/21 05/05/22  Nita Sells, MD  SUMAtriptan (IMITREX) 100 MG tablet Take 100 mg by mouth every 2 (two) hours as needed for migraine or headache. 04/24/21   [provider]  SUMAtriptan (IMITREX) 20 MG/ACT nasal spray Place 1 spray (20 mg total) into the nose every 2 (two) hours as needed for migraine or headache. May repeat in 2 hours if headache persists or recurs. 01/17/16   McVey, Gelene Mink, PA-C  SUMAtriptan (IMITREX) 50 MG tablet Take 1 tablet (50 mg total) by mouth once. May repeat in 2 hours if needed. No more than 2 per 24 hours. No more than 3 times per week. Patient not taking: Reported on 05/03/2021 01/17/16 05/17/16  McVey, Gelene Mink, PA-C  traZODone (DESYREL) 50 MG tablet Take 1 tablet (50 mg total) by mouth at bedtime as needed  for sleep. 05/05/21   Nita Sells, MD    Family History Family History  Problem Relation Age of Onset   Heart disease Mother    Hypertension Mother    Cancer Mother    Hypertension Father    Anemia Father    Hypertension Maternal Grandmother    Heart disease Paternal Grandfather        MI   Cancer Maternal Uncle    Cancer Paternal Aunt    Alcohol abuse Paternal Uncle  Social History Social History   Tobacco Use   Smoking status: Never   Smokeless tobacco: Never  Substance Use Topics   Alcohol use: No   Drug use: No     Allergies   Patient has no known allergies.   Review of Systems Review of Systems  Constitutional:  Negative for chills and fever.  Eyes:  Negative for discharge and redness.  Gastrointestinal:  Positive for nausea and vomiting. Negative for abdominal pain.    Physical Exam Triage Vital Signs ED Triage Vitals  Enc Vitals Group     BP 05/06/21 1234 (!) 153/104     Pulse Rate 05/06/21 1234 81     Resp 05/06/21 1234 18     Temp 05/06/21 1234 98.1 F (36.7 C)     Temp Source 05/06/21 1234 Oral     SpO2 05/06/21 1234 98 %     Weight --      Height --      Head Circumference --      Peak Flow --      Pain Score 05/06/21 1235 0     Pain Loc --      Pain Edu? --      Excl. in [place]? --    No data found.  Updated Vital Signs BP (!) 153/104 (BP Location: Left Arm)    Pulse 81    Temp 98.1 F (36.7 C) (Oral)    Resp 18    LMP 04/11/2021    SpO2 98%      Physical Exam Vitals and nursing note reviewed.  Constitutional:      General: She is not in acute distress.    Appearance: Normal appearance. She is not ill-appearing.  HENT:     Head: Normocephalic and atraumatic.  Eyes:     Conjunctiva/sclera: Conjunctivae normal.  Cardiovascular:     Rate and Rhythm: Normal rate.  Pulmonary:     Effort: Pulmonary effort is normal.  Neurological:     Mental Status: She is alert.  Psychiatric:        Mood and Affect: Mood normal.         Behavior: Behavior normal.        Thought Content: Thought content normal.     UC Treatments / Results  Labs (all labs ordered are listed, but only abnormal results are displayed) Labs Reviewed - No data to display  EKG   Radiology No results found.  Procedures Procedures (including critical care time)  Medications Ordered in UC Medications  metoCLOPramide (REGLAN) injection 5 mg (5 mg Intramuscular Given 05/06/21 1247)    Initial Impression / Assessment and Plan / UC Course  I have reviewed the triage vital signs and the nursing notes.  Pertinent labs & imaging results that were available during my care of the patient were reviewed by me and considered in my medical decision making (see chart for details).  Reglan injection administered in office as requested with very strong recommendation to report to the emergency department should this not improve symptoms over the next hour.  Patient expresses understanding.   Final Clinical Impressions(s) / UC Diagnoses   Final diagnoses:  Nausea and vomiting, unspecified vomiting type   Discharge Instructions   None    ED Prescriptions   None    PDMP not reviewed this encounter.   Francene Finders, PA-C 05/06/21 1300

## 2021-05-06 NOTE — ED Triage Notes (Signed)
Pt here for N/V; pt sts released from hospital yesterday for same and given oral compazine but can not keep down; pt requesting injection of reglan

## 2021-05-08 LAB — SURGICAL PATHOLOGY

## 2021-05-26 DIAGNOSIS — K2101 Gastro-esophageal reflux disease with esophagitis, with bleeding: Secondary | ICD-10-CM | POA: Insufficient documentation

## 2021-07-30 ENCOUNTER — Encounter (HOSPITAL_COMMUNITY): Payer: Self-pay

## 2021-07-30 ENCOUNTER — Emergency Department (HOSPITAL_COMMUNITY): Payer: BC Managed Care – PPO

## 2021-07-30 ENCOUNTER — Observation Stay (HOSPITAL_COMMUNITY)
Admission: EM | Admit: 2021-07-30 | Discharge: 2021-08-01 | Disposition: A | Discharge status: Home / Self Care | Payer: BC Managed Care – PPO | Attending: Internal Medicine | Admitting: Internal Medicine

## 2021-07-30 ENCOUNTER — Other Ambulatory Visit: Payer: Self-pay

## 2021-07-30 DIAGNOSIS — R1084 Generalized abdominal pain: Secondary | ICD-10-CM | POA: Insufficient documentation

## 2021-07-30 DIAGNOSIS — I10 Essential (primary) hypertension: Secondary | ICD-10-CM | POA: Diagnosis not present

## 2021-07-30 DIAGNOSIS — J45909 Unspecified asthma, uncomplicated: Secondary | ICD-10-CM | POA: Insufficient documentation

## 2021-07-30 DIAGNOSIS — Z91148 Patient's other noncompliance with medication regimen for other reason: Secondary | ICD-10-CM | POA: Diagnosis not present

## 2021-07-30 DIAGNOSIS — E785 Hyperlipidemia, unspecified: Secondary | ICD-10-CM | POA: Diagnosis not present

## 2021-07-30 DIAGNOSIS — G4733 Obstructive sleep apnea (adult) (pediatric): Secondary | ICD-10-CM | POA: Diagnosis present

## 2021-07-30 DIAGNOSIS — R112 Nausea with vomiting, unspecified: Principal | ICD-10-CM | POA: Diagnosis present

## 2021-07-30 DIAGNOSIS — Z20822 Contact with and (suspected) exposure to covid-19: Secondary | ICD-10-CM | POA: Insufficient documentation

## 2021-07-30 DIAGNOSIS — G43909 Migraine, unspecified, not intractable, without status migrainosus: Secondary | ICD-10-CM | POA: Diagnosis present

## 2021-07-30 DIAGNOSIS — F32A Depression, unspecified: Secondary | ICD-10-CM | POA: Diagnosis present

## 2021-07-30 DIAGNOSIS — Z79899 Other long term (current) drug therapy: Secondary | ICD-10-CM | POA: Insufficient documentation

## 2021-07-30 DIAGNOSIS — F419 Anxiety disorder, unspecified: Secondary | ICD-10-CM | POA: Diagnosis present

## 2021-07-30 LAB — URINALYSIS, ROUTINE W REFLEX MICROSCOPIC
Bilirubin Urine: NEGATIVE
Glucose, UA: NEGATIVE mg/dL
Hgb urine dipstick: NEGATIVE
Ketones, ur: 5 mg/dL — AB
Leukocytes,Ua: NEGATIVE
Nitrite: NEGATIVE
Protein, ur: NEGATIVE mg/dL
Specific Gravity, Urine: >1.046 — ABNORMAL HIGH (ref 1.005–1.030)
pH: 8 (ref 5.0–8.0)

## 2021-07-30 LAB — COMPREHENSIVE METABOLIC PANEL
ALT: 18 U/L (ref 0–44)
AST: 20 U/L (ref 15–41)
Albumin: 4.2 g/dL (ref 3.5–5.0)
Alkaline Phosphatase: 88 U/L (ref 38–126)
Anion gap: 8 (ref 5–15)
BUN: 14 mg/dL (ref 6–20)
CO2: 28 mmol/L (ref 22–32)
Calcium: 9.4 mg/dL (ref 8.9–10.3)
Chloride: 105 mmol/L (ref 98–111)
Creatinine, Ser: 0.93 mg/dL (ref 0.44–1.00)
GFR, Estimated: >60 mL/min (ref >60)
Glucose, Bld: 141 mg/dL — ABNORMAL HIGH (ref 70–99)
Potassium: 3.7 mmol/L (ref 3.5–5.1)
Sodium: 141 mmol/L (ref 135–145)
Total Bilirubin: 0.6 mg/dL (ref 0.3–1.2)
Total Protein: 7.5 g/dL (ref 6.5–8.1)

## 2021-07-30 LAB — I-STAT BETA HCG BLOOD, ED (MC, WL, AP ONLY): I-stat hCG, quantitative: <5 m[IU]/mL (ref <5)

## 2021-07-30 LAB — LIPASE, BLOOD: Lipase: 26 U/L (ref 11–51)

## 2021-07-30 LAB — CBC
HCT: 42.9 % (ref 36.0–46.0)
Hemoglobin: 14 g/dL (ref 12.0–15.0)
MCH: 30 pg (ref 26.0–34.0)
MCHC: 32.6 g/dL (ref 30.0–36.0)
MCV: 91.9 fL (ref 80.0–100.0)
Platelets: 226 10*3/uL (ref 150–400)
RBC: 4.67 MIL/uL (ref 3.87–5.11)
RDW: 13.1 % (ref 11.5–15.5)
WBC: 5.1 10*3/uL (ref 4.0–10.5)
nRBC: 0 % (ref 0.0–0.2)

## 2021-07-30 LAB — HEMOGLOBIN A1C
Hgb A1c MFr Bld: 5.4 % (ref 4.8–5.6)
Mean Plasma Glucose: 108.28 mg/dL

## 2021-07-30 MED ORDER — LACTATED RINGERS IV SOLN: INTRAVENOUS | Status: DC

## 2021-07-30 MED ORDER — HYDRALAZINE HCL 20 MG/ML IJ SOLN: 10.0000 mg | Freq: Three times a day (TID) | INTRAMUSCULAR | Status: DC | PRN

## 2021-07-30 MED ORDER — SODIUM CHLORIDE (PF) 0.9 % IJ SOLN
INTRAMUSCULAR | Status: AC
Start: 2021-07-30 — End: 2021-07-30
  Filled 2021-07-30: qty 50

## 2021-07-30 MED ORDER — IOHEXOL 300 MG/ML  SOLN
100.0000 mL | Freq: Once | INTRAMUSCULAR | Status: AC | PRN
  Administered 2021-07-30: 100 mL via INTRAVENOUS

## 2021-07-30 MED ORDER — ONDANSETRON 4 MG PO TBDP
4.0000 mg | ORAL_TABLET | Freq: Once | ORAL | Status: AC | PRN
  Administered 2021-07-30: 4 mg via ORAL
  Filled 2021-07-30: qty 1

## 2021-07-30 MED ORDER — METOCLOPRAMIDE HCL 5 MG/ML IJ SOLN
10.0000 mg | Freq: Three times a day (TID) | INTRAMUSCULAR | Status: DC
  Administered 2021-07-30 – 2021-07-31 (×2): 10 mg via INTRAVENOUS
  Filled 2021-07-30 (×2): qty 2

## 2021-07-30 MED ORDER — METOCLOPRAMIDE HCL 5 MG/ML IJ SOLN
10.0000 mg | Freq: Once | INTRAMUSCULAR | Status: AC
  Administered 2021-07-30: 10 mg via INTRAVENOUS
  Filled 2021-07-30: qty 2

## 2021-07-30 MED ORDER — PROCHLORPERAZINE MALEATE 10 MG PO TABS
10.0000 mg | ORAL_TABLET | Freq: Four times a day (QID) | ORAL | Status: DC | PRN
  Filled 2021-07-30: qty 1

## 2021-07-30 MED ORDER — PROCHLORPERAZINE EDISYLATE 10 MG/2ML IJ SOLN
10.0000 mg | Freq: Four times a day (QID) | INTRAMUSCULAR | Status: DC | PRN
  Administered 2021-07-30 – 2021-07-31 (×3): 10 mg via INTRAVENOUS
  Filled 2021-07-30 (×3): qty 2

## 2021-07-30 MED ORDER — SODIUM CHLORIDE 0.9 % IV BOLUS
1000.0000 mL | Freq: Once | INTRAVENOUS | Status: AC
  Administered 2021-07-30: 1000 mL via INTRAVENOUS

## 2021-07-30 MED ORDER — ENOXAPARIN SODIUM 40 MG/0.4ML IJ SOSY
40.0000 mg | PREFILLED_SYRINGE | INTRAMUSCULAR | Status: DC
  Administered 2021-07-30 – 2021-07-31 (×2): 40 mg via SUBCUTANEOUS
  Filled 2021-07-30 (×2): qty 0.4

## 2021-07-30 MED ORDER — PANTOPRAZOLE SODIUM 40 MG IV SOLR
40.0000 mg | INTRAVENOUS | Status: DC
  Administered 2021-07-31: 40 mg via INTRAVENOUS
  Filled 2021-07-30: qty 10

## 2021-07-30 MED ORDER — ONDANSETRON HCL 4 MG/2ML IJ SOLN: 4.0000 mg | Freq: Once | INTRAMUSCULAR | Status: DC | PRN

## 2021-07-30 MED ORDER — PANTOPRAZOLE SODIUM 40 MG IV SOLR
40.0000 mg | Freq: Once | INTRAVENOUS | Status: AC
  Administered 2021-07-30: 40 mg via INTRAVENOUS
  Filled 2021-07-30: qty 10

## 2021-07-30 MED ORDER — PROCHLORPERAZINE EDISYLATE 10 MG/2ML IJ SOLN
10.0000 mg | Freq: Four times a day (QID) | INTRAMUSCULAR | Status: DC | PRN
  Filled 2021-07-30: qty 2

## 2021-07-30 MED ORDER — LORAZEPAM 2 MG/ML IJ SOLN
1.0000 mg | Freq: Four times a day (QID) | INTRAMUSCULAR | Status: DC | PRN
  Administered 2021-07-30 – 2021-07-31 (×2): 1 mg via INTRAVENOUS
  Filled 2021-07-30 (×2): qty 1

## 2021-07-30 NOTE — ED Triage Notes (Signed)
Patient complains of lower abdominal pain that started yesterday evening at 7pm, with nausea. Patient states both have gotten worse over time. Patient also states she vomited dark red blood with small clots within the emesis. HX of gastritis.  ?

## 2021-07-30 NOTE — ED Provider Notes (Signed)
?Monique Shaw ?Provider Note ? ? ?[name]: [id number] ?Arrival date & time: 07/30/21  9485 ? ?  ? ?History ?Chief Complaint  ?Patient presents with  ? Abdominal Pain  ? Nausea  ? ? ?Monique Shaw is a 41 y.o. female with history of gastritis, hiatal hernia, and hypertension who presents to the emergency department today with nausea and vomiting since 7 PM last night.  Patient states that she did not have dinner but had a barbecue pulled pork sandwich for lunch.  She describes similar symptoms to when she was previously seen evaluated in the emergency department back in February and March.  She states this feels similar.  She reports associated epigastric abdominal pain.  No diarrhea.  No fever or chills.  Patient did not elicit hematemesis to me but per triage note she was having some dark-colored vomiting.  She denies urinary complaints. ? ?Reviewing old records, EGD that was done back in February showed evidence of gastritis and grade 3 esophageal tear. ? ? ?Abdominal Pain ? ?  ? ?Home Medications ?Prior to Admission medications   ?Medication Sig Start Date End Date Taking? Authorizing Provider  ?ALPRAZolam (XANAX) 1 MG tablet Take 1 mg by mouth 4 (four) times daily as needed for anxiety. 02/20/21   [provider]  ?ARIPiprazole (ABILIFY) 5 MG tablet Take 5 mg by mouth at bedtime. 04/05/21   [provider]  ?atorvastatin (LIPITOR) 20 MG tablet Take 20 mg by mouth daily.    [provider]  ?Azelastine HCl 137 MCG/SPRAY SOLN Place 1 spray into both nostrils daily. 12/15/20   [provider]  ?buPROPion (WELLBUTRIN XL) 300 MG 24 hr tablet Take 300 mg by mouth every morning. 03/27/21   [provider]  ?busPIRone (BUSPAR) 15 MG tablet Take 15 mg by mouth 2 (two) times daily. 03/02/21   [provider]  ?Cholecalciferol (VITAMIN D3) 1.25 MG (50000 UT) CAPS Take 1 capsule by mouth once a week. wednesday 02/21/21   [provider]  ?desvenlafaxine (PRISTIQ) 100 MG 24 hr tablet Take 100 mg by mouth daily.    [provider]  ?dicyclomine (BENTYL) 10 MG/5ML solution Take 5 mLs (10 mg total) by mouth 3 (three) times daily before meals. 05/05/21   Rhetta Mura, MD  ?famotidine (PEPCID) 40 MG tablet Take 40 mg by mouth daily. 03/08/21   [provider]  ?hydrOXYzine (ATARAX) 25 MG tablet Take 50 mg by mouth 2 (two) times daily. 02/20/21   [provider]  ?levocetirizine (XYZAL) 5 MG tablet Take 5 mg by mouth every evening.    [provider]  ?lisinopril (ZESTRIL) 5 MG tablet Take 5 mg by mouth daily. 03/08/21   [provider]  ?metoCLOPramide (REGLAN) 5 MG tablet Take 1 tablet (5 mg total) by mouth every 6 (six) hours as needed for up to 3 days for refractory nausea / vomiting (use for nausea/vomiting not improved with compazine). ?Patient taking differently: Take 5 mg by mouth every 8 (eight) hours as needed for refractory nausea / vomiting (use for nausea/vomiting not improved with compazine). 04/29/21 05/03/21  Zigmund Daniel., MD  ?montelukast (SINGULAIR) 10 MG tablet Take 10 mg by mouth daily. 04/04/21   [provider]  ?pantoprazole (PROTONIX) 40 MG tablet Take 40 mg by mouth daily. 03/08/21   [provider]  ?prochlorperazine (COMPAZINE) 5 MG tablet Take 2 tablets (10 mg total) by mouth every 8 (eight) hours as needed for up  to 5 days for nausea or vomiting. 05/05/21 05/10/21  Nita Sells, MD  ?sucralfate (CARAFATE) 1 g tablet Take 1 tablet (1 g total) by mouth 4 (four) times daily. 05/05/21 05/05/22  Nita Sells, MD  ?SUMAtriptan (IMITREX) 100 MG tablet Take 100 mg by mouth every 2 (two) hours as needed for migraine or headache. 04/24/21   [provider]  ?SUMAtriptan (IMITREX) 20 MG/ACT nasal spray Place 1 spray (20 mg total) into the nose every 2 (two) hours as needed for migraine or headache. May repeat in 2 hours if  headache persists or recurs. 01/17/16   McVey, Gelene Mink, PA-C  ?SUMAtriptan (IMITREX) 50 MG tablet Take 1 tablet (50 mg total) by mouth once. May repeat in 2 hours if needed. No more than 2 per 24 hours. No more than 3 times per week. ?Patient not taking: Reported on 05/03/2021 01/17/16 05/17/16  McVey, Gelene Mink, PA-C  ?traZODone (DESYREL) 50 MG tablet Take 1 tablet (50 mg total) by mouth at bedtime as needed for sleep. 05/05/21   Nita Sells, MD  ?   ? ?Allergies    ?Patient has no known allergies.   ? ?Review of Systems   ?Review of Systems  ?Gastrointestinal:  Positive for abdominal pain.  ?All other systems reviewed and are negative. ? ?Physical Exam ?Updated Vital Signs ?BP (!) 142/81   Pulse 61   Temp 98 ?F (36.7 ?C) (Oral)   Resp 19   LMP 07/23/2021 (Exact Date)   SpO2 97%  ?Physical Exam ?Vitals and nursing note reviewed.  ?Constitutional:   ?   General: She is not in acute distress. ?   Appearance: Normal appearance.  ?HENT:  ?   Head: Normocephalic and atraumatic.  ?Eyes:  ?   General:     ?   Right eye: No discharge.     ?   Left eye: No discharge.  ?Cardiovascular:  ?   Comments: Regular rate and rhythm.  S1/S2 are distinct without any evidence of murmur, rubs, or gallops.  Radial pulses are 2+ bilaterally.  Dorsalis pedis pulses are 2+ bilaterally.  No evidence of pedal edema. ?Pulmonary:  ?   Comments: Clear to auscultation bilaterally.  Normal effort.  No respiratory distress.  No evidence of wheezes, rales, or rhonchi heard throughout. ?Abdominal:  ?   General: Abdomen is flat. Bowel sounds are normal. There is no distension.  ?   Tenderness: There is generalized abdominal tenderness. There is no guarding or rebound.  ?Musculoskeletal:     ?   General: Normal range of motion.  ?   Cervical back: Neck supple.  ?Skin: ?   General: Skin is warm and dry.  ?   Findings: No rash.  ?Neurological:  ?   General: No focal deficit present.  ?   Mental Status: She is alert.   ?Psychiatric:     ?   Mood and Affect: Mood normal.     ?   Behavior: Behavior normal.  ? ? ?ED Results / Procedures / Treatments   ?Labs ?(all labs ordered are listed, but only abnormal results are displayed) ?Labs Reviewed  ?COMPREHENSIVE METABOLIC PANEL - Abnormal; Notable for the following components:  ?    Result Value  ? Glucose, Bld 141 (*)   ? All other components within normal limits  ?URINALYSIS, ROUTINE W REFLEX MICROSCOPIC - Abnormal; Notable for the following components:  ? Specific Gravity, Urine >1.046 (*)   ? Ketones, ur 5 (*)   ?  All other components within normal limits  ?LIPASE, BLOOD  ?CBC  ?I-STAT BETA HCG BLOOD, ED ([facility], [facility], [facility] ONLY)  ? ? ?EKG ?EKG Interpretation ? ?Date/Time:  Sunday Jul 30 2021 07:06:39 EDT ?Ventricular Rate:  83 ?PR Interval:  145 ?QRS Duration: 92 ?QT Interval:  401 ?QTC Calculation: 472 ?R Axis:   -14 ?Text Interpretation: Sinus arrhythmia No significant change since last tracing Confirmed by Dorie Rank [phone]) on 07/30/2021 7:20:50 AM ? ?Radiology ?CT ABDOMEN PELVIS W CONTRAST ? ?Result Date: 07/30/2021 ?CLINICAL DATA:  Acute, nonlocalized abdominal pain with diffuse abdominal tenderness. EXAM: CT ABDOMEN AND PELVIS WITH CONTRAST TECHNIQUE: Multidetector CT imaging of the abdomen and pelvis was performed using the standard protocol following bolus administration of intravenous contrast. RADIATION DOSE REDUCTION: This exam was performed according to the departmental dose-optimization program which includes automated exposure control, adjustment of the mA and/or kV according to patient size and/or use of iterative reconstruction technique. CONTRAST:  127mL OMNIPAQUE IOHEXOL 300 MG/ML  SOLN COMPARISON:  04/25/2021 FINDINGS: Lower chest:  Small sliding hiatal hernia Hepatobiliary: Hepatic steatosis with diffuse low-density and subtle pericholecystic sparing.No evidence of biliary obstruction or stone. Pancreas: Unremarkable. Spleen: Unremarkable. Adrenals/Urinary Tract:  Negative adrenals. No hydronephrosis or stone. Unremarkable bladder. Stomach/Bowel:  No obstruction. No appendicitis. Vascular/Lymphatic: No acute vascular abnormality. No mass or adenopathy. Reproductive:IUD in place. Fibroid appear

## 2021-07-30 NOTE — Plan of Care (Signed)
  Problem: Education: Goal: Knowledge of General Education information will improve Description Including pain rating scale, medication(s)/side effects and non-pharmacologic comfort measures Outcome: Progressing   Problem: Health Behavior/Discharge Planning: Goal: Ability to manage health-related needs will improve Outcome: Progressing   

## 2021-07-30 NOTE — H&P (Signed)
?History and Physical  ? ? ?Patient: Monique Shaw WUJ:[id number] DOB: 03/12/1981 ?DOA: 07/30/2021 ?DOS: the patient was seen and examined on 07/30/2021 ?PCP: Harvie HeckJackson, Mishi, MD  ?Patient coming from: Home ? ?Chief Complaint:  ?Chief Complaint  ?Patient presents with  ? Abdominal Pain  ? Nausea  ? ?HPI: Monique Shaw is a 41 y.o. female with medical history significant of chronic N/V, GERD, anxiety/depression, HTN, HLD, migraines. Presenting with N/V. Her symptoms started last night. She is not sure of the trigger. She tried her home zofran, but it wasn't helpful. Her vomiting has been persistent through this morning. It has led to mild abdominal pain. She does not note any frank blood. No sick contacts. She denies fever or diarrhea. She says her last BM was yesterday. When her symptoms didn't improve this morning, she decided to come to the ED for assistance.   ? ?Review of Systems: As mentioned in the history of present illness. All other systems reviewed and are negative. ?Past Medical History:  ?Diagnosis Date  ? Allergy   ? Anxiety   ? ASCVD (arteriosclerotic cardiovascular disease) 2011  ? Asthma   ? Blood type, Rh negative   ? Depression   ? GERD (gastroesophageal reflux disease)   ? H/O migraine 10/25/10  ? H/O toxoplasmosis   ? H/O varicella   ? pt denies   ? History of elevated lipids 2011  ? Hx: UTI (urinary tract infection) 2007  ? Hypertension   ? IBS (irritable bowel syndrome)   ? Irregular menstrual cycle 2004  ? Migraine   ? Obese   ? Sleep apnea   ? Weight gain 2004  ? ?Past Surgical History:  ?Procedure Laterality Date  ? BIOPSY  05/04/2021  ? Procedure: BIOPSY;  Surgeon: Kathi DerBrahmbhatt, Parag, MD;  Location: [facility];  Service: Gastroenterology;;  ? ESOPHAGOGASTRODUODENOSCOPY N/A 05/04/2021  ? Procedure: ESOPHAGOGASTRODUODENOSCOPY (EGD);  Surgeon: Kathi DerBrahmbhatt, Parag, MD;  [facility];  Service: Gastroenterology;  Laterality: N/A;  ? gastroenteritis    ? WISDOM TOOTH  EXTRACTION    ? ?Social History:  reports that she has never smoked. She has never used smokeless tobacco. She reports that she does not drink alcohol and does not use drugs. ? ?No Known Allergies ? ?Family History  ?Problem Relation Age of Onset  ? Heart disease Mother   ? Hypertension Mother   ? Cancer Mother   ? Hypertension Father   ? Anemia Father   ? Hypertension Maternal Grandmother   ? Heart disease Paternal Grandfather   ?     MI  ? Cancer Maternal Uncle   ? Cancer Paternal Aunt   ? Alcohol abuse Paternal Uncle   ? ? ?Prior to Admission medications   ?Medication Sig Start Date End Date Taking? Authorizing Provider  ?ALPRAZolam (XANAX) 1 MG tablet Take 1 mg by mouth 4 (four) times daily as needed for anxiety. 02/20/21   [provider]  ?ARIPiprazole (ABILIFY) 5 MG tablet Take 5 mg by mouth at bedtime. 04/05/21   [provider]  ?atorvastatin (LIPITOR) 20 MG tablet Take 20 mg by mouth daily.    [provider]  ?Azelastine HCl 137 MCG/SPRAY SOLN Place 1 spray into both nostrils daily. 12/15/20   [provider]  ?buPROPion (WELLBUTRIN XL) 300 MG 24 hr tablet Take 300 mg by mouth every morning. 03/27/21   [provider]  ?busPIRone (BUSPAR) 15 MG tablet Take 15 mg by mouth 2 (two) times daily. 03/02/21  [provider]  ?Cholecalciferol (VITAMIN D3) 1.25 MG (50000 UT) CAPS Take 1 capsule by mouth once a week. wednesday 02/21/21   [provider]  ?desvenlafaxine (PRISTIQ) 100 MG 24 hr tablet Take 100 mg by mouth daily.    [provider]  ?dicyclomine (BENTYL) 10 MG/5ML solution Take 5 mLs (10 mg total) by mouth 3 (three) times daily before meals. 05/05/21   Rhetta Mura, MD  ?famotidine (PEPCID) 40 MG tablet Take 40 mg by mouth daily. 03/08/21   [provider]  ?hydrOXYzine (ATARAX) 25 MG tablet Take 50 mg by mouth 2 (two) times daily. 02/20/21   [provider]  ?levocetirizine (XYZAL) 5 MG tablet Take 5 mg by  mouth every evening.    [provider]  ?lisinopril (ZESTRIL) 5 MG tablet Take 5 mg by mouth daily. 03/08/21   [provider]  ?metoCLOPramide (REGLAN) 5 MG tablet Take 1 tablet (5 mg total) by mouth every 6 (six) hours as needed for up to 3 days for refractory nausea / vomiting (use for nausea/vomiting not improved with compazine). ?Patient taking differently: Take 5 mg by mouth every 8 (eight) hours as needed for refractory nausea / vomiting (use for nausea/vomiting not improved with compazine). 04/29/21 05/03/21  Zigmund Daniel., MD  ?montelukast (SINGULAIR) 10 MG tablet Take 10 mg by mouth daily. 04/04/21   [provider]  ?pantoprazole (PROTONIX) 40 MG tablet Take 40 mg by mouth daily. 03/08/21   [provider]  ?prochlorperazine (COMPAZINE) 5 MG tablet Take 2 tablets (10 mg total) by mouth every 8 (eight) hours as needed for up to 5 days for nausea or vomiting. 05/05/21 05/10/21  Rhetta Mura, MD  ?sucralfate (CARAFATE) 1 g tablet Take 1 tablet (1 g total) by mouth 4 (four) times daily. 05/05/21 05/05/22  Rhetta Mura, MD  ?SUMAtriptan (IMITREX) 100 MG tablet Take 100 mg by mouth every 2 (two) hours as needed for migraine or headache. 04/24/21   [provider]  ?SUMAtriptan (IMITREX) 20 MG/ACT nasal spray Place 1 spray (20 mg total) into the nose every 2 (two) hours as needed for migraine or headache. May repeat in 2 hours if headache persists or recurs. 01/17/16   McVey, Madelaine Bhat, PA-C  ?SUMAtriptan (IMITREX) 50 MG tablet Take 1 tablet (50 mg total) by mouth once. May repeat in 2 hours if needed. No more than 2 per 24 hours. No more than 3 times per week. ?Patient not taking: Reported on 05/03/2021 01/17/16 05/17/16  McVey, Madelaine Bhat, PA-C  ?traZODone (DESYREL) 50 MG tablet Take 1 tablet (50 mg total) by mouth at bedtime as needed for sleep. 05/05/21   Rhetta Mura, MD  ? ? ?Physical Exam: ?Vitals:  ? 07/30/21 0737 07/30/21  0830 07/30/21 0900 07/30/21 1030  ?BP: 135/83 (!) 144/104 (!) 143/101 (!) 140/94  ?Pulse: 79 66 63 67  ?Resp: 15 16 17 17   ?Temp:      ?TempSrc:      ?SpO2: 96% 99% 95% 98%  ? ?General: 41 y.o. female resting in bed in NAD ?Eyes: PERRL, normal sclera ?ENMT: Nares patent w/o discharge, orophaynx clear, dentition normal, ears w/o discharge/lesions/ulcers ?Neck: Supple, trachea midline ?Cardiovascular: RRR, +S1, S2, no m/g/r, equal pulses throughout ?Respiratory: CTABL, no w/r/r, normal WOB ?GI: BS+, ND, mild periumbilical TTP, no masses noted, no organomegaly noted ?MSK: No e/c/c ?Neuro: A&O x 3, no focal deficits ?Psyc: Appropriate interaction and affect, calm/cooperative ? ?Data Reviewed: ? ?Na+  141 ?Glucose  141 ?Scr  0.93 ?WBC  5.1 ? ?CT ab/pelvis w/ contrast ?1. No acute finding. ?2.  Hepatic steatosis and small hiatal hernia. ?3. Sacral stimulator with lead terminating in the left gluteal musculature. ? ?EKG: sinus, no st elevations ? ?Assessment and Plan: ?Intractable N/V ?    - place in obs, med-surg ?    - reglan, PRN compazine ?    - NPO except ice chips ?    - LR @ 100cc/hr ?    - protonix ? ?HTN ?    - PRN hydralazine ?    - resume home regimen when tolerating PO ? ?HLD ?    - resume home regimen when tolerating PO ? ?Migraines ?    - PRN compazine for now ?    - resume home regimen when tolerating PO ? ?Anxiety/Depression ?    - resume home regimen when tolerating PO ? ?Advance Care Planning:   Code Status: FULL ? ?Consults: None ? ?Family Communication: None at bedside ? ?Severity of Illness: ?The appropriate patient status for this patient is OBSERVATION. Observation status is judged to be reasonable and necessary in order to provide the required intensity of service to ensure the patient's safety. The patient's presenting symptoms, physical exam findings, and initial radiographic and laboratory data in the context of their medical condition is felt to place them at decreased risk for further clinical  deterioration. Furthermore, it is anticipated that the patient will be medically stable for discharge from the hospital within 2 midnights of admission.  ? ?Author: ?Teddy Spike, DO ?07/30/2021 12:24 PM ? ?

## 2021-07-31 DIAGNOSIS — E7800 Pure hypercholesterolemia, unspecified: Secondary | ICD-10-CM

## 2021-07-31 DIAGNOSIS — F320 Major depressive disorder, single episode, mild: Secondary | ICD-10-CM

## 2021-07-31 DIAGNOSIS — G4733 Obstructive sleep apnea (adult) (pediatric): Secondary | ICD-10-CM

## 2021-07-31 DIAGNOSIS — F419 Anxiety disorder, unspecified: Secondary | ICD-10-CM | POA: Diagnosis not present

## 2021-07-31 DIAGNOSIS — I10 Essential (primary) hypertension: Secondary | ICD-10-CM

## 2021-07-31 DIAGNOSIS — G43001 Migraine without aura, not intractable, with status migrainosus: Secondary | ICD-10-CM

## 2021-07-31 DIAGNOSIS — R112 Nausea with vomiting, unspecified: Secondary | ICD-10-CM | POA: Diagnosis not present

## 2021-07-31 LAB — CBC
HCT: 40.3 % (ref 36.0–46.0)
Hemoglobin: 13.6 g/dL (ref 12.0–15.0)
MCH: 31.1 pg (ref 26.0–34.0)
MCHC: 33.7 g/dL (ref 30.0–36.0)
MCV: 92.2 fL (ref 80.0–100.0)
Platelets: 227 10*3/uL (ref 150–400)
RBC: 4.37 MIL/uL (ref 3.87–5.11)
RDW: 13.3 % (ref 11.5–15.5)
WBC: 7.5 10*3/uL (ref 4.0–10.5)
nRBC: 0 % (ref 0.0–0.2)

## 2021-07-31 LAB — COMPREHENSIVE METABOLIC PANEL
ALT: 20 U/L (ref 0–44)
AST: 19 U/L (ref 15–41)
Albumin: 3.6 g/dL (ref 3.5–5.0)
Alkaline Phosphatase: 72 U/L (ref 38–126)
Anion gap: 9 (ref 5–15)
BUN: 16 mg/dL (ref 6–20)
CO2: 26 mmol/L (ref 22–32)
Calcium: 8.7 mg/dL — ABNORMAL LOW (ref 8.9–10.3)
Chloride: 103 mmol/L (ref 98–111)
Creatinine, Ser: 0.93 mg/dL (ref 0.44–1.00)
GFR, Estimated: >60 mL/min (ref >60)
Glucose, Bld: 103 mg/dL — ABNORMAL HIGH (ref 70–99)
Potassium: 3.1 mmol/L — ABNORMAL LOW (ref 3.5–5.1)
Sodium: 138 mmol/L (ref 135–145)
Total Bilirubin: 0.6 mg/dL (ref 0.3–1.2)
Total Protein: 6.5 g/dL (ref 6.5–8.1)

## 2021-07-31 LAB — RESP PANEL BY RT-PCR (FLU A&B, COVID) ARPGX2
Influenza A by PCR: NEGATIVE
Influenza B by PCR: NEGATIVE
SARS Coronavirus 2 by RT PCR: NEGATIVE

## 2021-07-31 MED ORDER — METOCLOPRAMIDE HCL 5 MG PO TABS
10.0000 mg | ORAL_TABLET | Freq: Three times a day (TID) | ORAL | Status: DC
  Administered 2021-07-31 (×3): 10 mg via ORAL
  Filled 2021-07-31 (×4): qty 2

## 2021-07-31 MED ORDER — METOCLOPRAMIDE HCL 5 MG PO TABS: 5.0000 mg | ORAL_TABLET | Freq: Four times a day (QID) | ORAL | Status: DC | PRN

## 2021-07-31 MED ORDER — MELATONIN 3 MG PO TABS
3.0000 mg | ORAL_TABLET | Freq: Once | ORAL | Status: AC
  Administered 2021-08-01: 3 mg via ORAL
  Filled 2021-07-31: qty 1

## 2021-07-31 MED ORDER — SUCRALFATE 1 G PO TABS
1.0000 g | ORAL_TABLET | Freq: Three times a day (TID) | ORAL | Status: DC
  Administered 2021-07-31 (×3): 1 g via ORAL
  Filled 2021-07-31 (×4): qty 1

## 2021-07-31 MED ORDER — PROCHLORPERAZINE EDISYLATE 10 MG/2ML IJ SOLN
5.0000 mg | Freq: Once | INTRAMUSCULAR | Status: AC
  Administered 2021-07-31: 5 mg via INTRAVENOUS
  Filled 2021-07-31: qty 2

## 2021-07-31 MED ORDER — ARIPIPRAZOLE 10 MG PO TABS
5.0000 mg | ORAL_TABLET | Freq: Every day | ORAL | Status: DC
  Filled 2021-07-31 (×2): qty 1

## 2021-07-31 MED ORDER — FAMOTIDINE 20 MG PO TABS
40.0000 mg | ORAL_TABLET | Freq: Every day | ORAL | Status: DC
  Administered 2021-07-31: 40 mg via ORAL
  Filled 2021-07-31 (×2): qty 2

## 2021-07-31 MED ORDER — TRAZODONE HCL 50 MG PO TABS
50.0000 mg | ORAL_TABLET | Freq: Every evening | ORAL | Status: DC | PRN
  Administered 2021-07-31: 50 mg via ORAL
  Filled 2021-07-31: qty 1

## 2021-07-31 MED ORDER — POTASSIUM CHLORIDE CRYS ER 20 MEQ PO TBCR
50.0000 meq | EXTENDED_RELEASE_TABLET | Freq: Once | ORAL | Status: AC
  Administered 2021-07-31: 50 meq via ORAL
  Filled 2021-07-31: qty 1

## 2021-07-31 MED ORDER — BUSPIRONE HCL 5 MG PO TABS
15.0000 mg | ORAL_TABLET | Freq: Two times a day (BID) | ORAL | Status: DC
  Filled 2021-07-31 (×2): qty 1

## 2021-07-31 MED ORDER — ATORVASTATIN CALCIUM 20 MG PO TABS
20.0000 mg | ORAL_TABLET | Freq: Every day | ORAL | Status: DC
Start: 2021-07-31 — End: 2021-08-01
  Filled 2021-07-31 (×2): qty 1

## 2021-07-31 MED ORDER — BUPROPION HCL ER (XL) 300 MG PO TB24
300.0000 mg | ORAL_TABLET | Freq: Every morning | ORAL | Status: DC
Start: 2021-08-01 — End: 2021-08-01
  Filled 2021-07-31: qty 1

## 2021-07-31 MED ORDER — MONTELUKAST SODIUM 10 MG PO TABS
10.0000 mg | ORAL_TABLET | Freq: Every day | ORAL | Status: DC
  Filled 2021-07-31: qty 1

## 2021-07-31 MED ORDER — PROCHLORPERAZINE MALEATE 10 MG PO TABS
10.0000 mg | ORAL_TABLET | Freq: Three times a day (TID) | ORAL | Status: DC | PRN
  Administered 2021-07-31: 10 mg via ORAL
  Filled 2021-07-31: qty 1

## 2021-07-31 MED ORDER — DICYCLOMINE HCL 10 MG/5ML PO SOLN
10.0000 mg | Freq: Three times a day (TID) | ORAL | Status: DC
Start: 2021-07-31 — End: 2021-08-01
  Administered 2021-07-31 – 2021-08-01 (×2): 10 mg via ORAL
  Filled 2021-07-31 (×3): qty 5

## 2021-07-31 MED ORDER — SUMATRIPTAN SUCCINATE 50 MG PO TABS
50.0000 mg | ORAL_TABLET | Freq: Once | ORAL | Status: DC
  Filled 2021-07-31: qty 1

## 2021-07-31 MED ORDER — PANTOPRAZOLE SODIUM 40 MG PO TBEC
80.0000 mg | DELAYED_RELEASE_TABLET | Freq: Every day | ORAL | Status: DC
Start: 2021-08-01 — End: 2021-08-01

## 2021-07-31 NOTE — Plan of Care (Signed)

## 2021-07-31 NOTE — Progress Notes (Signed)
?PROGRESS NOTE ? ? ? ?Monique Shaw  ZOX:[id number] DOB: 1980/03/29 DOA: 07/30/2021 ?PCP: Harvie HeckJackson, Mishi, MD  ? ? ? ?Brief Narrative:  ?Monique S [place] Monique Shaw is a 41 y.o. BF PMHx chronic N/V, GERD, anxiety/depression, HTN, HLD, Migraines.  IBS, OSA, s/p Sacral Stimulator. ? ?Presenting with N/V. Her symptoms started last night. She is not sure of the trigger. She tried her [facility] zofran, but it wasn't helpful. Her vomiting has been persistent through this morning. It has led to mild abdominal pain. She does not note any frank blood. No sick contacts. She denies fever or diarrhea. She says her last BM was yesterday. When her symptoms didn't improve this morning, she decided to come to the ED for assistance.   ? ? ?Subjective: ?A/O x4, patient admits to not taking her Monique medication as prescribed (did not understand this would precipitate her abdominal symptoms).  Currently negative abdominal pain, negative nausea, negative vomiting. ? ? ?Assessment & Plan: ?Covid vaccination; ?  ?Principal Problem: ?  Intractable nausea and vomiting ?Active Problems: ?  Anxiety ?  Depression ?  Hyperlipidemia ?  Essential hypertension ?  Migraines ? ? ? Intractable N/V ?-5/15 resolved with medication.  See noncompliance with medication ?- 5/15 full liquid diet ?     ?Noncompliance with medication ?- 5/15 per pharmacy review patient not taking her medication prescribed for intractable N/V. ?- Bentyl 10 mg TID ?-Pepcid 40 mg daily ?-Reglan 10 mg  qac/qhs ?-Protonix 80 mg daily ?-Compazine 10 mg TID PRN ?- Carafate 1 g TID ?  ?Essential HTN ?-Hold Monique BP medication ?- Hydralazine PRN ?      ?HLD ?     ?  ?Migraines ?     ?  ?Anxiety/Depression ?-Will not restart Xanax per pharmacy not taking    ? ?Hypokalemia ?- Potassium goal> 4 ?- 5/15 K-Dur 50 mEq ? ?Morbidly obese (BMI  41.51 kg/m?). ?-Address with PCP ? ? ?  ? ? ? ?Mobility Assessment (last 72 hours)   ? ? Mobility Assessment   ? ? Row Name 07/30/21 1435  ?  ?  ?  ?   ? Does patient have an order for bedrest or is patient medically unstable No - Continue assessment      ? What is the highest level of mobility based on the progressive mobility assessment? Level 5 (Walks with assist in room/hall) - Balance while stepping forward/back and can walk in room with assist - Complete      ? ?  ?  ? ?  ? ? ? ? ?Interdisciplinary Goals of Care Family Meeting ? ? ?Date carried out: 07/31/2021 ? ?Location of the meeting:  ? ?Member's involved:  ? ?Durable Power of Insurance risk surveyorAttorney or acting medical decision maker:    ? ?Discussion: We discussed goals of care for Monique Shaw .   ? ?Code status:  ? ?Disposition:  ? ?Time spent for the meeting:  ? ? ? ?Kimoni Pagliarulo, Roselind MessierURTIS J, MD ? ?07/31/2021, 7:58 AM ? ? ?  ? ?   ?DVT prophylaxis: Lovenox ?Code Status: Full ?Family Communication:  ?Status is: Inpatient ? ? ? ?Dispo: The patient is from: [facility] ?             Anticipated d/c is to: [facility] ?             Anticipated d/c date is: 1 day ?             Patient currently is not  medically stable to d/c. ? ? ? ? ? ?Consultants:  ? ? ?Procedures/Significant Events:  ?5/14 CT abdomen pelvis W contrast ?-No acute finding. ?-Hepatic steatosis and small hiatal hernia. 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 There is a sacral stimulator which has its leads ?terminating in the left gluteal musculature. ? ? ?I have personally reviewed and interpreted all radiology studies and my findings are as above. ? ?VENTILATOR SETTINGS: ? ? ? ?Cultures ?5/15 influenza A/B negative ?5/15 SARS coronavirus negative ? ? ?Antimicrobials: ? ? ? ?Devices ?  ? ?LINES / TUBES:  ? ? ? ? ?Continuous Infusions: ? lactated ringers 100 mL/hr at 07/31/21 0245  ? ? ? ?Objective: ?Vitals:  ? 07/30/21 1728 07/30/21 2021 07/30/21 2349 07/31/21 0300  ?BP: 132/77 117/75 103/65 113/77  ?Pulse: 68 74 84 95  ?Resp: 17 17 17 16   ?Temp: 98.9 ?F (37.2 ?C) 98.6 ?F (37 ?C) 98.7 ?F (37.1  ?C) 98.5 ?F (36.9 ?C)  ?TempSrc: Oral Oral Oral Oral  ?SpO2: 100% 97% 94% 97%  ?Weight:      ?Height:      ? ? ?Intake/Output Summary (Last 24 hours) at 07/31/2021 0758 ?Last data filed at 07/31/2021 0400 ?Gross per 24 hour  ?Intake 930.61 ml  ?Output --  ?Net 930.61 ml  ? ?Filed Weights  ? 07/30/21 1435  ?Weight: 127.5 kg  ? ? ?Examination: ? ?General: A/O x4, No acute respiratory distress ?Eyes: negative scleral hemorrhage, negative anisocoria, negative icterus ?ENT: Negative Runny nose, negative gingival bleeding, ?Neck:  Negative scars, masses, torticollis, lymphadenopathy, JVD ?Lungs: Clear to auscultation bilaterally without wheezes or crackles ?Cardiovascular: Regular rate and rhythm without murmur gallop or rub normal S1 and S2 ?Abdomen: MORBIDLY OBESE, negative abdominal pain, nondistended, positive soft, bowel sounds, no rebound, no ascites, no appreciable mass ?Extremities: No significant cyanosis, clubbing, or edema bilateral lower extremities ?Skin: Negative rashes, lesions, ulcers ?Psychiatric:  Negative depression, negative anxiety, negative fatigue, negative mania  ?Central nervous system:  Cranial nerves II through XII intact, tongue/uvula midline, all extremities muscle strength 5/5, sensation intact throughout, negative dysarthria, negative expressive aphasia, negative receptive aphasia. ? ?.  ? ? ? ?Data Reviewed: Care during the described time interval was provided by me .  I have reviewed this patient's available data, including medical history, events of note, physical examination, and all test results as part of my evaluation. ? ?CBC: ?Recent Labs  ?Lab 07/30/21 ?0706 07/31/21 ?0326  ?WBC 5.1 7.5  ?HGB 14.0 13.6  ?HCT 42.9 40.3  ?MCV 91.9 92.2  ?PLT 226 227  ? ?Basic Metabolic Panel: ?Recent Labs  ?Lab 07/30/21 ?0706 07/31/21 ?0326  ?NA 141 138  ?K 3.7 3.1*  ?CL 105 103  ?CO2 28 26  ?GLUCOSE 141* 103*  ?BUN 14 16  ?CREATININE 0.93 0.93  ?CALCIUM 9.4 8.7*  ? ?GFR: ?Estimated Creatinine  Clearance: 114 mL/min (by C-G formula based on SCr of 0.93 mg/dL). ?Liver Function Tests: ?Recent Labs  ?Lab 07/30/21 ?0706 07/31/21 ?0326  ?AST 20 19  ?ALT 18 20  ?ALKPHOS 88 72  ?BILITOT 0.6 0.6  ?PROT 7.5 6.5  ?ALBUMIN 4.2 3.6  ? ?Recent Labs  ?Lab 07/30/21 ?0706  ?LIPASE 26  ? ?No results for input(s): AMMONIA in the last 168 hours. ?Coagulation Profile: ?No results for input(s): INR, PROTIME in the last 168 hours. ?Cardiac Enzymes: ?No results for input(s): CKTOTAL, CKMB, CKMBINDEX, TROPONINI in the last 168 hours. ?BNP (last 3 results) ?No results  for input(s): PROBNP in the last 8760 hours. ?HbA1C: ?Recent Labs  ?  07/30/21 ?0706  ?HGBA1C 5.4  ? ?CBG: ?No results for input(s): GLUCAP in the last 168 hours. ?Lipid Profile: ?No results for input(s): CHOL, HDL, LDLCALC, TRIG, CHOLHDL, LDLDIRECT in the last 72 hours. ?Thyroid Function Tests: ?No results for input(s): TSH, T4TOTAL, FREET4, T3FREE, THYROIDAB in the last 72 hours. ?Anemia Panel: ?No results for input(s): VITAMINB12, FOLATE, FERRITIN, TIBC, IRON, RETICCTPCT in the last 72 hours. ?Sepsis Labs: ?No results for input(s): PROCALCITON, LATICACIDVEN in the last 168 hours. ? ?No results found for this or any previous visit (from the past 240 hour(s)).  ? ? ? ? ? ?Radiology Studies: ?CT ABDOMEN PELVIS W CONTRAST ? ?Result Date: 07/30/2021 ?CLINICAL DATA:  Acute, nonlocalized abdominal pain with diffuse abdominal tenderness. EXAM: CT ABDOMEN AND PELVIS WITH CONTRAST TECHNIQUE: Multidetector CT imaging of the abdomen and pelvis was performed using the standard protocol following bolus administration of intravenous contrast. RADIATION DOSE REDUCTION: This exam was performed according to the departmental dose-optimization program which includes automated exposure control, adjustment of the mA and/or kV according to patient size and/or use of iterative reconstruction technique. CONTRAST:  OMNIPAQUE IOHEXOL 300 MG/ML  SOLN COMPARISON:  04/25/2021 FINDINGS:  Lower chest:  Small sliding hiatal hernia Hepatobiliary: Hepatic steatosis with diffuse low-density and subtle pericholecystic sparing.No evidence of biliary obstruction or stone. Pancreas: Unremarkable. Spleen:

## 2021-07-31 NOTE — Progress Notes (Signed)
Transition of Care (TOC) Screening Note ? ?Patient Details  ?Name: Monique Shaw ?Date of Birth: 1980/04/12 ? ?Transition of Care (TOC) CM/SW Contact:    ?Ewing Schlein, LCSW ?Phone Number: ?07/31/2021, 9:18 AM ? ?Transition of Care Department [facility]) has reviewed patient and no TOC needs have been identified at this time. We will continue to monitor patient advancement through interdisciplinary progression rounds. If new patient transition needs arise, please place a TOC consult. ?

## 2021-08-01 DIAGNOSIS — I10 Essential (primary) hypertension: Secondary | ICD-10-CM | POA: Diagnosis not present

## 2021-08-01 DIAGNOSIS — F419 Anxiety disorder, unspecified: Secondary | ICD-10-CM | POA: Diagnosis not present

## 2021-08-01 DIAGNOSIS — F320 Major depressive disorder, single episode, mild: Secondary | ICD-10-CM | POA: Diagnosis not present

## 2021-08-01 DIAGNOSIS — R112 Nausea with vomiting, unspecified: Secondary | ICD-10-CM | POA: Diagnosis not present

## 2021-08-01 LAB — CBC WITH DIFFERENTIAL/PLATELET
Abs Immature Granulocytes: 0.02 10*3/uL (ref 0.00–0.07)
Basophils Absolute: 0 10*3/uL (ref 0.0–0.1)
Basophils Relative: 0 %
Eosinophils Absolute: 0 10*3/uL (ref 0.0–0.5)
Eosinophils Relative: 0 %
HCT: 37.5 % (ref 36.0–46.0)
Hemoglobin: 12.5 g/dL (ref 12.0–15.0)
Immature Granulocytes: 0 %
Lymphocytes Relative: 32 %
Lymphs Abs: 1.7 10*3/uL (ref 0.7–4.0)
MCH: 30 pg (ref 26.0–34.0)
MCHC: 33.3 g/dL (ref 30.0–36.0)
MCV: 90.1 fL (ref 80.0–100.0)
Monocytes Absolute: 0.6 10*3/uL (ref 0.1–1.0)
Monocytes Relative: 11 %
Neutro Abs: 3 10*3/uL (ref 1.7–7.7)
Neutrophils Relative %: 57 %
Platelets: 205 10*3/uL (ref 150–400)
RBC: 4.16 MIL/uL (ref 3.87–5.11)
RDW: 12.8 % (ref 11.5–15.5)
WBC: 5.3 10*3/uL (ref 4.0–10.5)
nRBC: 0 % (ref 0.0–0.2)

## 2021-08-01 LAB — COMPREHENSIVE METABOLIC PANEL
ALT: 17 U/L (ref 0–44)
AST: 14 U/L — ABNORMAL LOW (ref 15–41)
Albumin: 3.3 g/dL — ABNORMAL LOW (ref 3.5–5.0)
Alkaline Phosphatase: 65 U/L (ref 38–126)
Anion gap: 6 (ref 5–15)
BUN: 12 mg/dL (ref 6–20)
CO2: 26 mmol/L (ref 22–32)
Calcium: 8.6 mg/dL — ABNORMAL LOW (ref 8.9–10.3)
Chloride: 103 mmol/L (ref 98–111)
Creatinine, Ser: 0.79 mg/dL (ref 0.44–1.00)
GFR, Estimated: >60 mL/min (ref >60)
Glucose, Bld: 99 mg/dL (ref 70–99)
Potassium: 3.6 mmol/L (ref 3.5–5.1)
Sodium: 135 mmol/L (ref 135–145)
Total Bilirubin: 0.9 mg/dL (ref 0.3–1.2)
Total Protein: 6 g/dL — ABNORMAL LOW (ref 6.5–8.1)

## 2021-08-01 LAB — MAGNESIUM: Magnesium: 1.8 mg/dL (ref 1.7–2.4)

## 2021-08-01 LAB — PHOSPHORUS: Phosphorus: 2.6 mg/dL (ref 2.5–4.6)

## 2021-08-01 MED ORDER — METOCLOPRAMIDE HCL 10 MG PO TABS: 10.0000 mg | ORAL_TABLET | Freq: Three times a day (TID) | ORAL | 0 refills | Status: DC

## 2021-08-01 MED ORDER — LORAZEPAM 2 MG/ML IJ SOLN
0.5000 mg | Freq: Once | INTRAMUSCULAR | Status: AC
  Administered 2021-08-01: 0.5 mg via INTRAVENOUS
  Filled 2021-08-01: qty 1

## 2021-08-01 MED ORDER — POTASSIUM CHLORIDE CRYS ER 20 MEQ PO TBCR
50.0000 meq | EXTENDED_RELEASE_TABLET | Freq: Once | ORAL | Status: AC
  Administered 2021-08-01: 50 meq via ORAL
  Filled 2021-08-01: qty 1

## 2021-08-01 NOTE — Progress Notes (Signed)
Discharge package printed and instructions given to patient. Pt verbalizes understanding. 

## 2021-08-01 NOTE — Discharge Summary (Signed)
Physician Discharge Summary  ?Monique Shaw KGU:[id number] DOB: 1981-01-22 DOA: 07/30/2021 ? ?PCP: Harvie Heck, MD ? ?Admit date: 07/30/2021 ?Discharge date: 08/01/2021 ? ?Time spent: 35 minutes ? ?Recommendations for Outpatient Follow-up:   ? ?Intractable N/V ?-5/15 resolved with medication.  See noncompliance with medication ?     ?Noncompliance with medication ?- 5/15 per pharmacy review patient not taking her medication prescribed for intractable N/V. ?- Bentyl 10 mg TID ?-Pepcid 40 mg daily ?-Reglan 10 mg  qac/qhs ?-Protonix 80 mg daily ?-Compazine 10 mg TID PRN ?- Carafate 1 g TID ?  ?Essential HTN ?-Hold home BP medication ?- Hydralazine PRN ?      ?HLD ?-Restart home meds    ?  ?Migraines ?-Restart home meds    ?  ?Anxiety/Depression ?-Will not restart Xanax per pharmacy not taking    ?  ?Hypokalemia ?- Potassium goal> 4 ?- 5/16 K-Dur 50 mEq ?  ?Morbidly obese (BMI  41.51 kg/m?). ?-Address with PCP ? ? ? ?Discharge Diagnoses:  ?Principal Problem: ?  Intractable nausea and vomiting ?Active Problems: ?  Anxiety ?  Depression ?  Hyperlipidemia ?  OSA (obstructive sleep apnea) ?  Essential hypertension ?  Migraines ? ? ?Discharge Condition: Stable ? ?Diet recommendation: Heart healthy ? ?Filed Weights  ? 07/30/21 1435  ?Weight: 127.5 kg  ? ? ?History of present illness:  ?Monique Shaw is a 41 y.o. BF PMHx chronic N/V, GERD, anxiety/depression, HTN, HLD, Migraines.  IBS, OSA, s/p Sacral Stimulator. ?  ?Presenting with N/V. Her symptoms started last night. She is not sure of the trigger. She tried her home zofran, but it wasn't helpful. Her vomiting has been persistent through this morning. It has led to mild abdominal pain. She does not note any frank blood. No sick contacts. She denies fever or diarrhea. She says her last BM was yesterday. When her symptoms didn't improve this morning, she decided to come to the ED for assistance.   ? ?Hospital Course:  ?See above ? ?Procedures: ?5/14 CT  abdomen pelvis W contrast ?-No acute finding. ?-Hepatic steatosis and small hiatal hernia. 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 There is a sacral stimulator which has its leads ?terminating in the left gluteal musculature.  ? ? ?Cultures  ?5/15 influenza A/B negative ?5/15 SARS coronavirus negative ? ? ? ?Discharge Exam: ?Vitals:  ? 07/31/21 0912 07/31/21 1313 07/31/21 2121 08/01/21 0524  ?BP: 127/85 136/80 139/85 138/79  ?Pulse: 92 65 64 70  ?Resp: 18 18 17 17   ?Temp: 98.5 ?F (36.9 ?C) 97.7 ?F (36.5 ?C) 98.7 ?F (37.1 ?C) 98.2 ?F (36.8 ?C)  ?TempSrc: Oral Oral Oral Oral  ?SpO2: 100% 100% 98% 99%  ?Weight:      ?Height:      ? ? ?General: A/O x4, No acute respiratory distress ?Eyes: negative scleral hemorrhage, negative anisocoria, negative icterus ?ENT: Negative Runny nose, negative gingival bleeding, ?Neck:  Negative scars, masses, torticollis, lymphadenopathy, JVD ?Lungs: Clear to auscultation bilaterally without wheezes or crackles ?Cardiovascular: Regular rate and rhythm without murmur gallop or rub normal S1 and S2 ?Abdomen: MORBIDLY OBESE, negative abdominal pain, nondistended, positive soft, bowel sounds, no rebound, no ascites, no appreciable mass ? ? ?Discharge Instructions ? ? ?Allergies as of 08/01/2021   ?No Known Allergies ?  ? ?  ?Medication List  ?  ? ?STOP taking these medications   ? ?ALPRAZolam 1 MG tablet ?Commonly known as: 08/03/2021 ?  ?SUMAtriptan  20 MG/ACT nasal spray ?Commonly known as: Imitrex ?  ? ?  ? ?TAKE these medications   ? ?ARIPiprazole 5 MG tablet ?Commonly known as: ABILIFY ?Take 5 mg by mouth daily. ?  ?atorvastatin 20 MG tablet ?Commonly known as: LIPITOR ?Take 20 mg by mouth daily. ?  ?Azelastine HCl 137 MCG/SPRAY Soln ?Place 1 spray into both nostrils daily. ?  ?buPROPion 300 MG 24 hr tablet ?Commonly known as: WELLBUTRIN XL ?Take 300 mg by mouth every morning. ?  ?busPIRone 15 MG tablet ?Commonly known as:  BUSPAR ?Take 15 mg by mouth 2 (two) times daily. ?  ?desvenlafaxine 100 MG 24 hr tablet ?Commonly known as: PRISTIQ ?Take 100 mg by mouth daily. ?  ?dicyclomine 10 MG/5ML solution ?Commonly known as: BENTYL ?Take 5 mLs (10 mg total) by mouth 3 (three) times daily before meals. ?What changed:  ?when to take this ?reasons to take this ?  ?esomeprazole 40 MG capsule ?Commonly known as: NEXIUM ?Take 40 mg by mouth 2 (two) times daily. ?  ?famotidine 40 MG tablet ?Commonly known as: PEPCID ?Take 40 mg by mouth daily. ?  ?hydrOXYzine 25 MG tablet ?Commonly known as: ATARAX ?Take 1 mg by mouth See admin instructions. Take 25 mg by mouth 6 times a day ?  ?levocetirizine 5 MG tablet ?Commonly known as: XYZAL ?Take 5 mg by mouth every evening. ?  ?lisinopril 5 MG tablet ?Commonly known as: ZESTRIL ?Take 5 mg by mouth daily. ?  ?metoCLOPramide 10 MG tablet ?Commonly known as: Reglan ?Take 1 tablet (10 mg total) by mouth 4 (four) times daily -  before meals and at bedtime. ?What changed:  ?medication strength ?how much to take ?when to take this ?reasons to take this ?  ?montelukast 10 MG tablet ?Commonly known as: SINGULAIR ?Take 10 mg by mouth daily. ?  ?pantoprazole 40 MG tablet ?Commonly known as: PROTONIX ?Take 40 mg by mouth daily. ?  ?prochlorperazine 5 MG tablet ?Commonly known as: COMPAZINE ?Take 2 tablets (10 mg total) by mouth every 8 (eight) hours as needed for up to 5 days for nausea or vomiting. ?  ?sucralfate 1 g tablet ?Commonly known as: Carafate ?Take 1 tablet (1 g total) by mouth 4 (four) times daily. ?  ?SUMAtriptan 50 MG tablet ?Commonly known as: IMITREX ?Take 1 tablet (50 mg total) by mouth once. May repeat in 2 hours if needed. No more than 2 per 24 hours. No more than 3 times per week. ?What changed: Another medication with the same name was removed. Continue taking this medication, and follow the directions you see here. ?  ?traZODone 50 MG tablet ?Commonly known as: DESYREL ?Take 1 tablet (50 mg  total) by mouth at bedtime as needed for sleep. ?What changed: how much to take ?  ?Vitamin D3 1.25 MG (50000 UT) Caps ?Take 1 capsule by mouth once a week. wednesday ?  ? ?  ? ?No Known Allergies ? ? ? ?The results of significant diagnostics from this hospitalization (including imaging, microbiology, ancillary and laboratory) are listed below for reference.   ? ?Significant Diagnostic Studies: ?CT ABDOMEN PELVIS W CONTRAST ? ?Result Date: 07/30/2021 ?CLINICAL DATA:  Acute, nonlocalized abdominal pain with diffuse abdominal tenderness. EXAM: CT ABDOMEN AND PELVIS WITH CONTRAST TECHNIQUE: Multidetector CT imaging of the abdomen and pelvis was performed using the standard protocol following bolus administration of intravenous contrast. RADIATION DOSE REDUCTION: This exam was performed according to the departmental dose-optimization program which includes automated exposure control, adjustment of  the mA and/or kV according to patient size and/or use of iterative reconstruction technique. CONTRAST:  100mL OMNIPAQUE IOHEXOL 300 MG/ML  SOLN COMPARISON:  04/25/2021 FINDINGS: Lower chest:  Small sliding hiatal hernia Hepatobiliary: Hepatic steatosis with diffuse low-density and subtle pericholecystic sparing.No evidence of biliary obstruction or stone. Pancreas: Unremarkable. Spleen: Unremarkable. Adrenals/Urinary Tract: Negative adrenals. No hydronephrosis or stone. Unremarkable bladder. Stomach/Bowel:  No obstruction. No appendicitis. Vascular/Lymphatic: No acute vascular abnormality. No mass or adenopathy. Reproductive:IUD in place. Fibroid appearance including 3.3 cm fibroid deforming the uterine fundus. Other: No ascites or pneumoperitoneum. Musculoskeletal: No acute abnormalities. Focal disc degeneration at L5-S1 with disc collapse, bulging, and ridging causing biforaminal impingement. There is a sacral stimulator which has its leads terminating in the left gluteal musculature. IMPRESSION: 1. No acute finding. 2.   Hepatic steatosis and small hiatal hernia. 3. Sacral stimulator with lead terminating in the left gluteal musculature. Electronically Signed   By: Tiburcio PeaJonathan  Watts M.D.   On: 07/30/2021 08:27   ? ?Microbiology:

## 2021-08-01 NOTE — Plan of Care (Signed)
  Problem: Activity: Goal: Risk for activity intolerance will decrease Outcome: Progressing   Problem: Pain Managment: Goal: General experience of comfort will improve Outcome: Progressing   Problem: Safety: Goal: Ability to remain free from injury will improve Outcome: Progressing   

## 2021-11-18 ENCOUNTER — Inpatient Hospital Stay (HOSPITAL_COMMUNITY)
Admission: AD | Admit: 2021-11-18 | Discharge: 2021-11-18 | Disposition: A | Discharge status: Home / Self Care | Payer: BC Managed Care – PPO | Attending: Obstetrics and Gynecology | Admitting: Obstetrics and Gynecology

## 2021-11-18 ENCOUNTER — Other Ambulatory Visit: Payer: Self-pay

## 2021-11-18 ENCOUNTER — Inpatient Hospital Stay (HOSPITAL_COMMUNITY): Payer: BC Managed Care – PPO

## 2021-11-18 ENCOUNTER — Encounter (HOSPITAL_COMMUNITY)
Admission: AD | Disposition: A | Discharge status: Home / Self Care | Payer: Self-pay | Source: Home / Self Care | Attending: Obstetrics and Gynecology

## 2021-11-18 ENCOUNTER — Encounter (HOSPITAL_COMMUNITY): Payer: Self-pay

## 2021-11-18 ENCOUNTER — Inpatient Hospital Stay (HOSPITAL_COMMUNITY): Payer: BC Managed Care – PPO | Admitting: Anesthesiology

## 2021-11-18 DIAGNOSIS — K661 Hemoperitoneum: Secondary | ICD-10-CM

## 2021-11-18 DIAGNOSIS — G4730 Sleep apnea, unspecified: Secondary | ICD-10-CM | POA: Diagnosis not present

## 2021-11-18 DIAGNOSIS — K219 Gastro-esophageal reflux disease without esophagitis: Secondary | ICD-10-CM | POA: Insufficient documentation

## 2021-11-18 DIAGNOSIS — O00102 Left tubal pregnancy without intrauterine pregnancy: Secondary | ICD-10-CM | POA: Insufficient documentation

## 2021-11-18 DIAGNOSIS — Z3A01 Less than 8 weeks gestation of pregnancy: Secondary | ICD-10-CM

## 2021-11-18 DIAGNOSIS — J45909 Unspecified asthma, uncomplicated: Secondary | ICD-10-CM | POA: Insufficient documentation

## 2021-11-18 DIAGNOSIS — R519 Headache, unspecified: Secondary | ICD-10-CM | POA: Insufficient documentation

## 2021-11-18 DIAGNOSIS — I10 Essential (primary) hypertension: Secondary | ICD-10-CM | POA: Insufficient documentation

## 2021-11-18 HISTORY — PX: LAPAROSCOPIC BILATERAL SALPINGECTOMY: SHX5889

## 2021-11-18 LAB — CBC
HCT: 36.5 % (ref 36.0–46.0)
Hemoglobin: 12.3 g/dL (ref 12.0–15.0)
MCH: 29.9 pg (ref 26.0–34.0)
MCHC: 33.7 g/dL (ref 30.0–36.0)
MCV: 88.8 fL (ref 80.0–100.0)
Platelets: 240 10*3/uL (ref 150–400)
RBC: 4.11 MIL/uL (ref 3.87–5.11)
RDW: 13.6 % (ref 11.5–15.5)
WBC: 5.4 10*3/uL (ref 4.0–10.5)
nRBC: 0 % (ref 0.0–0.2)

## 2021-11-18 LAB — URINALYSIS, ROUTINE W REFLEX MICROSCOPIC
Bilirubin Urine: NEGATIVE
Glucose, UA: NEGATIVE mg/dL
Ketones, ur: NEGATIVE mg/dL
Nitrite: NEGATIVE
Protein, ur: NEGATIVE mg/dL
Specific Gravity, Urine: 1.026 (ref 1.005–1.030)
pH: 5 (ref 5.0–8.0)

## 2021-11-18 LAB — WET PREP, GENITAL
Clue Cells Wet Prep HPF POC: NONE SEEN
Sperm: NONE SEEN
Trich, Wet Prep: NONE SEEN
WBC, Wet Prep HPF POC: >=10 — AB (ref <10)
Yeast Wet Prep HPF POC: NONE SEEN

## 2021-11-18 LAB — HIV ANTIBODY (ROUTINE TESTING W REFLEX): HIV Screen 4th Generation wRfx: NONREACTIVE

## 2021-11-18 LAB — HCG, QUANTITATIVE, PREGNANCY: hCG, Beta Chain, Quant, S: 8358 m[IU]/mL — ABNORMAL HIGH (ref <5)

## 2021-11-18 LAB — TYPE AND SCREEN
ABO/RH(D): O NEG
Antibody Screen: NEGATIVE

## 2021-11-18 LAB — POCT PREGNANCY, URINE: Preg Test, Ur: POSITIVE — AB

## 2021-11-18 SURGERY — SALPINGECTOMY, BILATERAL, LAPAROSCOPIC
Anesthesia: General | Laterality: Left

## 2021-11-18 MED ORDER — ONDANSETRON HCL 4 MG/2ML IJ SOLN
INTRAMUSCULAR | Status: AC
  Filled 2021-11-18: qty 2

## 2021-11-18 MED ORDER — MIDAZOLAM HCL 2 MG/2ML IJ SOLN
INTRAMUSCULAR | Status: DC | PRN
  Administered 2021-11-18: 2 mg via INTRAVENOUS

## 2021-11-18 MED ORDER — ORAL CARE MOUTH RINSE: 15.0000 mL | Freq: Once | OROMUCOSAL | Status: AC

## 2021-11-18 MED ORDER — ONDANSETRON HCL 4 MG/2ML IJ SOLN
INTRAMUSCULAR | Status: DC | PRN
  Administered 2021-11-18: 4 mg via INTRAVENOUS

## 2021-11-18 MED ORDER — DIPHENHYDRAMINE HCL 50 MG/ML IJ SOLN
INTRAMUSCULAR | Status: DC | PRN
  Administered 2021-11-18: 12.5 mg via INTRAVENOUS

## 2021-11-18 MED ORDER — ACETAMINOPHEN 500 MG PO TABS
ORAL_TABLET | ORAL | Status: AC
  Administered 2021-11-18: 1000 mg via ORAL
  Filled 2021-11-18: qty 2

## 2021-11-18 MED ORDER — DEXAMETHASONE SODIUM PHOSPHATE 10 MG/ML IJ SOLN
INTRAMUSCULAR | Status: DC | PRN
  Administered 2021-11-18: 5 mg via INTRAVENOUS

## 2021-11-18 MED ORDER — SILVER NITRATE-POT NITRATE 75-25 % EX MISC
CUTANEOUS | Status: DC | PRN
  Administered 2021-11-18: 2 via TOPICAL

## 2021-11-18 MED ORDER — KETOROLAC TROMETHAMINE 30 MG/ML IJ SOLN
INTRAMUSCULAR | Status: AC
  Filled 2021-11-18: qty 1

## 2021-11-18 MED ORDER — FENTANYL CITRATE (PF) 250 MCG/5ML IJ SOLN
INTRAMUSCULAR | Status: DC | PRN
  Administered 2021-11-18: 100 ug via INTRAVENOUS
  Administered 2021-11-18: 25 ug via INTRAVENOUS

## 2021-11-18 MED ORDER — MIDAZOLAM HCL 2 MG/2ML IJ SOLN
INTRAMUSCULAR | Status: AC
  Filled 2021-11-18: qty 2

## 2021-11-18 MED ORDER — RHO D IMMUNE GLOBULIN 1500 UNIT/2ML IJ SOSY
300.0000 ug | PREFILLED_SYRINGE | Freq: Once | INTRAMUSCULAR | Status: AC
  Administered 2021-11-18: 300 ug via INTRAMUSCULAR
  Filled 2021-11-18: qty 2

## 2021-11-18 MED ORDER — POVIDONE-IODINE 10 % EX SWAB: 2.0000 | Freq: Once | CUTANEOUS | Status: DC

## 2021-11-18 MED ORDER — FENTANYL CITRATE (PF) 250 MCG/5ML IJ SOLN
INTRAMUSCULAR | Status: AC
  Filled 2021-11-18: qty 5

## 2021-11-18 MED ORDER — SODIUM CHLORIDE 0.9 % IV SOLN
2.0000 g | INTRAVENOUS | Status: AC
  Administered 2021-11-18: 2 g via INTRAVENOUS

## 2021-11-18 MED ORDER — CHLORHEXIDINE GLUCONATE 0.12 % MT SOLN: 15.0000 mL | Freq: Once | OROMUCOSAL | Status: AC

## 2021-11-18 MED ORDER — LACTATED RINGERS IV SOLN: INTRAVENOUS | Status: DC

## 2021-11-18 MED ORDER — KETOROLAC TROMETHAMINE 30 MG/ML IJ SOLN
INTRAMUSCULAR | Status: DC | PRN
  Administered 2021-11-18: 30 mg via INTRAVENOUS

## 2021-11-18 MED ORDER — PROPOFOL 10 MG/ML IV BOLUS
INTRAVENOUS | Status: DC | PRN
  Administered 2021-11-18: 200 mg via INTRAVENOUS

## 2021-11-18 MED ORDER — SODIUM CHLORIDE 0.9 % IV SOLN
INTRAVENOUS | Status: AC
  Filled 2021-11-18: qty 2

## 2021-11-18 MED ORDER — BUPIVACAINE HCL (PF) 0.25 % IJ SOLN
INTRAMUSCULAR | Status: AC
Start: 2021-11-18 — End: ?
  Filled 2021-11-18: qty 30

## 2021-11-18 MED ORDER — SUCCINYLCHOLINE CHLORIDE 200 MG/10ML IV SOSY
PREFILLED_SYRINGE | INTRAVENOUS | Status: DC | PRN
  Administered 2021-11-18: 120 mg via INTRAVENOUS

## 2021-11-18 MED ORDER — SUCCINYLCHOLINE CHLORIDE 200 MG/10ML IV SOSY
PREFILLED_SYRINGE | INTRAVENOUS | Status: AC
  Filled 2021-11-18: qty 10

## 2021-11-18 MED ORDER — LIDOCAINE 2% (20 MG/ML) 5 ML SYRINGE
INTRAMUSCULAR | Status: DC | PRN
  Administered 2021-11-18: 60 mg via INTRAVENOUS

## 2021-11-18 MED ORDER — LIDOCAINE 2% (20 MG/ML) 5 ML SYRINGE
INTRAMUSCULAR | Status: AC
  Filled 2021-11-18: qty 5

## 2021-11-18 MED ORDER — DIPHENHYDRAMINE HCL 50 MG/ML IJ SOLN
INTRAMUSCULAR | Status: AC
  Filled 2021-11-18: qty 1

## 2021-11-18 MED ORDER — HYDROMORPHONE HCL 1 MG/ML IJ SOLN
0.2500 mg | INTRAMUSCULAR | Status: DC | PRN
  Administered 2021-11-18 (×2): 0.5 mg via INTRAVENOUS

## 2021-11-18 MED ORDER — SUGAMMADEX SODIUM 200 MG/2ML IV SOLN
INTRAVENOUS | Status: DC | PRN
  Administered 2021-11-18 (×2): 100 mg via INTRAVENOUS

## 2021-11-18 MED ORDER — IBUPROFEN 800 MG PO TABS: 800.0000 mg | ORAL_TABLET | Freq: Three times a day (TID) | ORAL | 0 refills | Status: DC | PRN

## 2021-11-18 MED ORDER — DEXAMETHASONE SODIUM PHOSPHATE 10 MG/ML IJ SOLN
INTRAMUSCULAR | Status: AC
  Filled 2021-11-18: qty 1

## 2021-11-18 MED ORDER — BUPIVACAINE HCL (PF) 0.25 % IJ SOLN
INTRAMUSCULAR | Status: DC | PRN
  Administered 2021-11-18: 30 mL

## 2021-11-18 MED ORDER — PROPOFOL 10 MG/ML IV BOLUS
INTRAVENOUS | Status: AC
  Filled 2021-11-18: qty 20

## 2021-11-18 MED ORDER — ROCURONIUM BROMIDE 10 MG/ML (PF) SYRINGE
PREFILLED_SYRINGE | INTRAVENOUS | Status: DC | PRN
  Administered 2021-11-18: 30 mg via INTRAVENOUS

## 2021-11-18 MED ORDER — OXYCODONE HCL 5 MG PO TABS: 5.0000 mg | ORAL_TABLET | Freq: Four times a day (QID) | ORAL | 0 refills | Status: DC | PRN

## 2021-11-18 MED ORDER — HYDROMORPHONE HCL 1 MG/ML IJ SOLN
INTRAMUSCULAR | Status: AC
  Filled 2021-11-18: qty 1

## 2021-11-18 MED ORDER — ACETAMINOPHEN 500 MG PO TABS: 1000.0000 mg | ORAL_TABLET | ORAL | Status: AC

## 2021-11-18 MED ORDER — CHLORHEXIDINE GLUCONATE 0.12 % MT SOLN
OROMUCOSAL | Status: AC
  Administered 2021-11-18: 15 mL via OROMUCOSAL
  Filled 2021-11-18: qty 15

## 2021-11-18 MED ORDER — ROCURONIUM BROMIDE 10 MG/ML (PF) SYRINGE
PREFILLED_SYRINGE | INTRAVENOUS | Status: AC
  Filled 2021-11-18: qty 10

## 2021-11-18 MED ORDER — SODIUM CHLORIDE 0.9 % IR SOLN
Status: DC | PRN
  Administered 2021-11-18: 3000 mL

## 2021-11-18 SURGICAL SUPPLY — 35 items
APL SRG 38 LTWT LNG FL B (MISCELLANEOUS)
APPLICATOR ARISTA FLEXITIP XL (MISCELLANEOUS) IMPLANT
BAG SPEC RTRVL LRG 6X4 10 (ENDOMECHANICALS) ×1
CABLE HIGH FREQUENCY MONO STRZ (ELECTRODE) IMPLANT
DRSG OPSITE POSTOP 3X4 (GAUZE/BANDAGES/DRESSINGS) IMPLANT
GLOVE BIOGEL PI IND STRL 6.5 (GLOVE) ×2 IMPLANT
GLOVE BIOGEL PI IND STRL 7.0 (GLOVE) ×4 IMPLANT
GLOVE BIOGEL PI INDICATOR 6.5 (GLOVE) ×1
GLOVE BIOGEL PI INDICATOR 7.0 (GLOVE) ×2
GLOVE SURG ENC TEXT LTX SZ6.5 (GLOVE) ×4 IMPLANT
GOWN STRL REUS W/ TWL LRG LVL3 (GOWN DISPOSABLE) ×4 IMPLANT
GOWN STRL REUS W/TWL LRG LVL3 (GOWN DISPOSABLE) ×2
HEMOSTAT ARISTA ABSORB 3G PWDR (HEMOSTASIS) IMPLANT
IRRIG SUCT STRYKERFLOW 2 WTIP (MISCELLANEOUS) ×1
IRRIGATION SUCT STRKRFLW 2 WTP (MISCELLANEOUS) IMPLANT
KIT TURNOVER KIT B (KITS) ×2 IMPLANT
NS IRRIG 1000ML POUR BTL (IV SOLUTION) ×2 IMPLANT
PACK LAPAROSCOPY BASIN (CUSTOM PROCEDURE TRAY) ×2 IMPLANT
PACK TRENDGUARD 450 HYBRID PRO (MISCELLANEOUS) IMPLANT
POUCH LAPAROSCOPIC INSTRUMENT (MISCELLANEOUS) ×2 IMPLANT
POUCH SPECIMEN RETRIEVAL 10MM (ENDOMECHANICALS) IMPLANT
PROTECTOR NERVE ULNAR (MISCELLANEOUS) ×4 IMPLANT
SET TUBE SMOKE EVAC HIGH FLOW (TUBING) ×2 IMPLANT
SHEARS HARMONIC ACE PLUS 36CM (ENDOMECHANICALS) IMPLANT
SLEEVE ENDOPATH XCEL 5M (ENDOMECHANICALS) ×2 IMPLANT
SOLUTION ELECTROLUBE (MISCELLANEOUS) IMPLANT
SUT VIC AB 4-0 PS2 27 (SUTURE) IMPLANT
SUT VICRYL 0 UR6 27IN ABS (SUTURE) IMPLANT
SUT VICRYL 4-0 PS2 18IN ABS (SUTURE) ×2 IMPLANT
TOWEL GREEN STERILE FF (TOWEL DISPOSABLE) ×4 IMPLANT
TRAY FOLEY W/BAG SLVR 14FR (SET/KITS/TRAYS/PACK) ×2 IMPLANT
TRENDGUARD 450 HYBRID PRO PACK (MISCELLANEOUS) ×1
TROCAR XCEL NON-BLD 11X100MML (ENDOMECHANICALS) IMPLANT
TROCAR XCEL NON-BLD 5MMX100MML (ENDOMECHANICALS) ×2 IMPLANT
WARMER LAPAROSCOPE (MISCELLANEOUS) ×2 IMPLANT

## 2021-11-18 NOTE — H&P (Signed)
[name]: [id number]   Arrival date and time: 11/18/21 1120    Event Date/Time   First Provider Initiated Contact with Patient 11/18/21 1309          Chief Complaint  Patient presents with   Abdominal Pain   Vaginal Bleeding    Monique Shaw is a 41 y.o. year old [id number] female at [redacted]w[redacted]d weeks gestation who presents to MAU reporting intermittent lower LT abdominal cramping and and occasional pressure; pain rated 4/10. She states "it feels like a pull when I lift my LT leg up." She also reports light pink spotting x 1 episode with wiping in the BR. She denies any recent SI. She is an established patient of [facility]; Dr. Sallye Ober. Her spouse is present and contributing to the history taking.       OB History       Gravida  2   Para  1   Term  1   Preterm      AB      Living  1        SAB      IAB      Ectopic      Multiple      Live Births  1                   Past Medical History:  Diagnosis Date   Allergy     Anxiety     ASCVD (arteriosclerotic cardiovascular disease) 2011   Asthma     Blood type, Rh negative     Depression     GERD (gastroesophageal reflux disease)     H/O migraine 10/25/10   H/O toxoplasmosis     H/O varicella      pt denies    History of elevated lipids 2011   Hx: UTI (urinary tract infection) 2007   Hypertension     IBS (irritable bowel syndrome)     Irregular menstrual cycle 2004   Migraine     Obese     Sleep apnea     Weight gain 2004           Past Surgical History:  Procedure Laterality Date   BIOPSY   05/04/2021    Procedure: BIOPSY;  Surgeon: Kathi Der, MD;  Location: [facility];  Service: Gastroenterology;;   ESOPHAGOGASTRODUODENOSCOPY N/A 05/04/2021    Procedure: ESOPHAGOGASTRODUODENOSCOPY (EGD);  Surgeon: Kathi Der, MD;  Location: [facility];  Service: Gastroenterology;  Laterality: N/A;   gastroenteritis       WISDOM TOOTH EXTRACTION        *spinal interstem devise placed in back for  management of incontinence        Family History  Problem Relation Age of Onset   Heart disease Mother     Hypertension Mother     Cancer Mother     Hypertension Father     Anemia Father     Hypertension Maternal Grandmother     Heart disease Paternal Grandfather          MI   Cancer Maternal Uncle     Cancer Paternal Aunt     Alcohol abuse Paternal Uncle        Social History        Tobacco Use   Smoking status: Never   Smokeless tobacco: Never  Substance Use Topics   Alcohol use: No   Drug use: No      Allergies: No Known Allergies  Medications Prior to Admission  Medication Sig Dispense Refill Last Dose   folic acid (FOLVITE) 1 MG tablet Take 1 mg by mouth daily.     11/18/2021   NIFEdipine (PROCARDIA) 10 MG capsule Take 10 mg by mouth 3 (three) times daily.     11/18/2021   Prenatal Vit-Fe Fumarate-FA (PRENATAL MULTIVITAMIN) TABS tablet Take 1 tablet by mouth daily at 12 noon.     11/18/2021   ARIPiprazole (ABILIFY) 5 MG tablet Take 5 mg by mouth daily.         atorvastatin (LIPITOR) 20 MG tablet Take 20 mg by mouth daily.         Azelastine HCl 137 MCG/SPRAY SOLN Place 1 spray into both nostrils daily. (Patient not taking: Reported on 07/30/2021)         buPROPion (WELLBUTRIN XL) 300 MG 24 hr tablet Take 300 mg by mouth every morning.         busPIRone (BUSPAR) 15 MG tablet Take 15 mg by mouth 2 (two) times daily.         Cholecalciferol (VITAMIN D3) 1.25 MG (50000 UT) CAPS Take 1 capsule by mouth once a week. wednesday         desvenlafaxine (PRISTIQ) 100 MG 24 hr tablet Take 100 mg by mouth daily.         dicyclomine (BENTYL) 10 MG/5ML solution Take 5 mLs (10 mg total) by mouth 3 (three) times daily before meals. (Patient taking differently: Take 10 mg by mouth daily as needed (stomach discomfort).) 240 mL 12     esomeprazole (NEXIUM) 40 MG capsule Take 40 mg by mouth 2 (two) times daily.         famotidine (PEPCID) 40 MG tablet Take 40 mg by mouth daily.          hydrOXYzine (ATARAX) 25 MG tablet Take 1 mg by mouth See admin instructions. Take 25 mg by mouth 6 times a day         levocetirizine (XYZAL) 5 MG tablet Take 5 mg by mouth every evening.         lisinopril (ZESTRIL) 5 MG tablet Take 5 mg by mouth daily.         metoCLOPramide (REGLAN) 10 MG tablet Take 1 tablet (10 mg total) by mouth 4 (four) times daily -  before meals and at bedtime. 90 tablet 0     montelukast (SINGULAIR) 10 MG tablet Take 10 mg by mouth daily.         pantoprazole (PROTONIX) 40 MG tablet Take 40 mg by mouth daily. (Patient not taking: Reported on 07/30/2021)         prochlorperazine (COMPAZINE) 5 MG tablet Take 2 tablets (10 mg total) by mouth every 8 (eight) hours as needed for up to 5 days for nausea or vomiting. (Patient not taking: Reported on 07/30/2021) 30 tablet 0     sucralfate (CARAFATE) 1 g tablet Take 1 tablet (1 g total) by mouth 4 (four) times daily. (Patient not taking: Reported on 07/30/2021) 120 tablet 1     SUMAtriptan (IMITREX) 50 MG tablet Take 1 tablet (50 mg total) by mouth once. May repeat in 2 hours if needed. No more than 2 per 24 hours. No more than 3 times per week. (Patient not taking: Reported on 05/03/2021) 10 tablet 3     traZODone (DESYREL) 50 MG tablet Take 1 tablet (50 mg total) by mouth at bedtime as needed for sleep. (Patient taking differently: Take 150 mg  by mouth at bedtime as needed for sleep.) 30 tablet 0        Review of Systems  Constitutional: Negative.   HENT: Negative.    Eyes: Negative.   Respiratory: Negative.    Cardiovascular: Negative.   Gastrointestinal: Negative.   Endocrine: Negative.   Genitourinary:  Positive for pelvic pain (lower LT side, increase when lifts of LT leg, "feels like a pull and occasional pressure.") and vaginal bleeding (pink spotting with wiping x 1 episode).  Musculoskeletal: Negative.   Skin: Negative.   Allergic/Immunologic: Negative.   Neurological: Negative.   Hematological: Negative.    Psychiatric/Behavioral: Negative.      Physical Exam    Blood pressure 121/81, pulse 87, temperature 98.6 F (37 C), temperature source Oral, resp. rate 19, height 5\' 9"  (1.753 m), weight (!) 138 kg, last menstrual period 09/28/2021, SpO2 100 %.   Physical Exam Vitals and nursing note reviewed. Exam conducted with a chaperone present.  Constitutional:      Appearance: Normal appearance. She is obese.  Abdominal:     Palpations: Abdomen is soft.  Genitourinary:    General: Normal vulva.     Comments: Pelvic exam: External genitalia normal, SE: vaginal walls pink and well rugated, cervix is smooth, pink, no lesions, scant amt of clear to white vaginal d/c -- WP, GC/CT done, cervix closed/long/soft, Uterus is non-tender, no CMT or friability, mild LT adnexal tenderness with palpation. Musculoskeletal:        General: Normal range of motion.  Skin:    General: Skin is warm and dry.  Neurological:     Mental Status: She is alert and oriented to person, place, and time.  Psychiatric:        Mood and Affect: Mood normal.        Behavior: Behavior normal.        Thought Content: Thought content normal.        Judgment: Judgment normal.      MAU Course  Procedures   MDM [facility] UPT CBC ABO/Rh -- known O Neg HCG Wet Prep GC/CT -- pending HIV -- pending OB < 14 wks 09/30/2021 with TV   *Consult with Dr. [place] @ [phone] - notified of patient's complaints, assessments, lab & U/S results as verbally given by Dr. [id number] (radiologist)  orders received from Dr. Neva Seat to prep patient for OR         Results for orders placed or performed during the hospital encounter of 11/18/21 (from the past 24 hour(s))  Pregnancy, urine POC     Status: Abnormal    Collection Time: 11/18/21 11:46 AM  Result Value Ref Range    Preg Test, Ur POSITIVE (A) NEGATIVE  Urinalysis, Routine w reflex microscopic Urine, Clean Catch     Status: Abnormal    Collection Time: 11/18/21 11:51 AM  Result Value Ref Range     Color, Urine YELLOW YELLOW    APPearance HAZY (A) CLEAR    Specific Gravity, Urine 1.026 1.005 - 1.030    pH 5.0 5.0 - 8.0    Glucose, UA NEGATIVE NEGATIVE mg/dL    Hgb urine dipstick MODERATE (A) NEGATIVE    Bilirubin Urine NEGATIVE NEGATIVE    Ketones, ur NEGATIVE NEGATIVE mg/dL    Protein, ur NEGATIVE NEGATIVE mg/dL    Nitrite NEGATIVE NEGATIVE    Leukocytes,Ua SMALL (A) NEGATIVE    RBC / HPF 0-5 0 - 5 RBC/hpf    WBC, UA 6-10 0 - 5 WBC/hpf  Bacteria, UA RARE (A) NONE SEEN    Squamous Epithelial / LPF 6-10 0 - 5    Mucus PRESENT    Wet prep, genital     Status: Abnormal    Collection Time: 11/18/21  1:19 PM  Result Value Ref Range    Yeast Wet Prep HPF POC NONE SEEN NONE SEEN    Trich, Wet Prep NONE SEEN NONE SEEN    Clue Cells Wet Prep HPF POC NONE SEEN NONE SEEN    WBC, Wet Prep HPF POC >=10 (A) <10    Sperm NONE SEEN    CBC     Status: None    Collection Time: 11/18/21  1:25 PM  Result Value Ref Range    WBC 5.4 4.0 - 10.5 K/uL    RBC 4.11 3.87 - 5.11 MIL/uL    Hemoglobin 12.3 12.0 - 15.0 g/dL    HCT 84.6 96.2 - 95.2 %    MCV 88.8 80.0 - 100.0 fL    MCH 29.9 26.0 - 34.0 pg    MCHC 33.7 30.0 - 36.0 g/dL    RDW 84.1 32.4 - 40.1 %    Platelets 240 150 - 400 K/uL    nRBC 0.0 0.0 - 0.2 %  hCG, quantitative, pregnancy     Status: Abnormal    Collection Time: 11/18/21  1:25 PM  Result Value Ref Range    hCG, Beta Chain, Quant, S 8,358 (H) <5 mIU/mL  Rh IG workup (includes ABO/Rh)     Status: None (Preliminary result)    Collection Time: 11/18/21  1:25 PM  Result Value Ref Range    Gestational Age(Wks) 7.2      ABO/RH(D) O NEG      Antibody Screen NEG      Unit Number [id number]      Blood Component Type RHIG      Unit division 00      Status of Unit ISSUED      Transfusion Status          OK TO TRANSFUSE Performed at [facility], [address], [place], [place] [address]        US OB LESS THAN 14 WEEKS WITH [place] TRANSVAGINAL   Result Date:  11/18/2021 CLINICAL DATA:  Abdominal pain. EXAM: OBSTETRIC <14 WK [place] AND TRANSVAGINAL OB US TECHNIQUE: Both transabdominal and transvaginal ultrasound examinations were performed for complete evaluation of the gestation as well as the maternal uterus, adnexal regions, and pelvic cul-de-sac. Transvaginal technique was performed to assess early pregnancy. COMPARISON:  CT scan of Jul 30, 2021. FINDINGS: Intrauterine gestational sac: None Yolk sac:  Not Visualized. Embryo:  Not Visualized. Cardiac Activity: Not Visualized. Subchorionic hemorrhage:  None visualized. Maternal uterus/adnexae: Right ovary is unremarkable. Left ovary is not clearly visualized. There is a large complex masslike abnormality in the left adnexal region. This is concerning for possible ruptured ectopic pregnancy with associated hemorrhage. IMPRESSION: No evidence of intrauterine gestation. There is a large complex masslike abnormality in the left adnexal region which is concerning for hemorrhage secondary to possible ruptured ectopic pregnancy. Correlation with laboratory data is recommended. Critical Value/emergent results were called by telephone at the time of interpretation on 11/18/2021 at 3:53 pm to provider Raelyn Mora , who verbally acknowledged these results. Electronically Signed   By: Lupita Raider M.D.   On: 11/18/2021 15:53      Assessment and Plan  1. Ruptured left tubal ectopic pregnancy causing hemoperitoneum Recommend laparoscopic removal of  ectopic possible left salpingostomy vs left salpingectomy. R/b/a of surgery discussed with patient including but not limited to infection bleeding. Damage to bowel bladder / ureters with the need for further surgery. R/O transfusion / transfusion reaction/ HIV/ Hepatitis B and C discussed. Pt voiced understanding and desires to proceed with surgery.

## 2021-11-18 NOTE — MAU Note (Signed)
...  Monique Shaw Duke Salvia is a 41 y.o. at approximately [redacted]w[redacted]d here in MAU reporting: Intermittent left lower abdominal cramping that also occasionally feels like pressure. She reports when she lifts her leg up and down it "almost feels like a pull." She reports last night she had one episode of light pink vaginal spotting when she woke up to use the restroom and none since. Denies recent IC. Denies vaginal itching, vaginal odors, and vaginal discharge.  LMP: 09/28/2021 Onset of complaint:  Pain score: 4/10 left lower abdomen  Lab orders placed from triage:  POCT Preg, UA

## 2021-11-18 NOTE — MAU Note (Signed)
Transporter at bedside to take patient to short stay.

## 2021-11-18 NOTE — Progress Notes (Signed)
Right external jugular PIV removed and site redressed per [facility] policy and procedure. Patient tolerated well. Catheter removed intact, no bleeding, bruising or complications noted.

## 2021-11-18 NOTE — Progress Notes (Signed)
IV Team consult to remove IJ central line. Pt noted to have an EJ per flowsheet documentation. Notified bedside RN that they are able to remove an EJ.

## 2021-11-18 NOTE — Anesthesia Preprocedure Evaluation (Addendum)
Anesthesia Evaluation  Patient identified by MRN, date of birth, ID band Patient awake    Reviewed: Allergy & Precautions, H&P , NPO status , Patient's Chart, lab work & pertinent test results  Airway Mallampati: III  TM Distance: >3 FB Neck ROM: Full    Dental no notable dental hx. (+) Teeth Intact, Dental Advisory Given   Pulmonary asthma , sleep apnea ,    Pulmonary exam normal breath sounds clear to auscultation       Cardiovascular hypertension, Pt. on medications  Rhythm:Regular Rate:Normal     Neuro/Psych  Headaches, Anxiety Depression    GI/Hepatic Neg liver ROS, GERD  Medicated,  Endo/Other  Morbid obesity  Renal/GU negative Renal ROS  negative genitourinary   Musculoskeletal   Abdominal   Peds  Hematology negative hematology ROS (+)   Anesthesia Other Findings   Reproductive/Obstetrics negative OB ROS                            Anesthesia Physical Anesthesia Plan  ASA: 3  Anesthesia Plan: General   Post-op Pain Management: Ofirmev IV (intra-op)* and Toradol IV (intra-op)*   Induction: Intravenous, Rapid sequence and Cricoid pressure planned  PONV Risk Score and Plan: 4 or greater and Ondansetron, Dexamethasone and Midazolam  Airway Management Planned: Oral ETT  Additional Equipment:   Intra-op Plan:   Post-operative Plan: Extubation in OR  Informed Consent: I have reviewed the patients History and Physical, chart, labs and discussed the procedure including the risks, benefits and alternatives for the proposed anesthesia with the patient or authorized representative who has indicated his/her understanding and acceptance.     Dental advisory given  Plan Discussed with: CRNA  Anesthesia Plan Comments:         Anesthesia Quick Evaluation

## 2021-11-18 NOTE — Anesthesia Procedure Notes (Signed)
Procedure Name: Intubation Date/Time: 11/18/2021 6:26 PM  Performed by: Trinna Post., CRNAPre-anesthesia Checklist: Patient identified, Suction available, Emergency Drugs available, Patient being monitored and Timeout performed Patient Re-evaluated:Patient Re-evaluated prior to induction Oxygen Delivery Method: Circle system utilized Preoxygenation: Pre-oxygenation with 100% oxygen Induction Type: IV induction, Rapid sequence and Cricoid Pressure applied Laryngoscope Size: Mac and 3 Grade View: Grade I Tube type: Oral Tube size: 7.0 mm Number of attempts: 1 Airway Equipment and Method: Stylet Placement Confirmation: ETT inserted through vocal cords under direct vision, positive ETCO2 and breath sounds checked- equal and bilateral Secured at: 22 cm Tube secured with: Tape Dental Injury: Teeth and Oropharynx as per pre-operative assessment

## 2021-11-18 NOTE — Anesthesia Postprocedure Evaluation (Signed)
Anesthesia Post Note  Patient: Monique Shaw  Procedure(s) Performed: LAPAROSCOPIC LEFT SALPINGECTOMY WITH REMOVAL OF ECTOPIC PREGNANCY (Left)     Patient location during evaluation: PACU Anesthesia Type: General Level of consciousness: awake and alert Pain management: pain level controlled Vital Signs Assessment: post-procedure vital signs reviewed and stable Respiratory status: spontaneous breathing, nonlabored ventilation and respiratory function stable Cardiovascular status: blood pressure returned to baseline and stable Postop Assessment: no apparent nausea or vomiting Anesthetic complications: no   No notable events documented.  Last Vitals:  Vitals:   11/18/21 2030 11/18/21 2045  BP: 115/71 128/85  Pulse: 68 66  Resp: 16 12  Temp:    SpO2: 100% 100%    Last Pain:  Vitals:   11/18/21 2045  TempSrc:   PainSc: 0-No pain                 Joey Lierman,W. EDMOND

## 2021-11-18 NOTE — Op Note (Signed)
11/18/2021  7:59 PM  PATIENT:  Monique Shaw  41 y.o. female  PRE-OPERATIVE DIAGNOSIS:  Ectopic Pregnancy  POST-OPERATIVE DIAGNOSIS:  Ectopic Pregnancy  PROCEDURE:  Procedure(s): LAPAROSCOPIC LEFT SALPINGECTOMY WITH REMOVAL OF ECTOPIC PREGNANCY (Left)  SURGEON:  Surgeon(s) and Role:    Gerald Leitz, MD - Primary  PHYSICIAN ASSISTANT:   ASSISTANTS: none   ANESTHESIA:   general  EBL:  20 mL   BLOOD ADMINISTERED:none  DRAINS: Foley catheter  LOCAL MEDICATIONS USED:  MARCAINE     SPECIMEN:  Source of Specimen:  left fallopian tube and ectopic pregnancy   DISPOSITION OF SPECIMEN:  PATHOLOGY  COUNTS:  YES  TOURNIQUET:  * No tourniquets in log *  DICTATION: .Note written in EPIC  PLAN OF CARE: Discharge to home after PACU  PATIENT DISPOSITION:  PACU - hemodynamically stable.   Delay start of Pharmacological VTE agent (>24hrs) due to surgical blood loss or risk of bleeding: not applicable  Findings: Partially rupture left ectopic pregnancy . Normal appearing right and left ovary. Normal appearing right fallopian tube. Fundal Fibroid noted   Procedure: Procedure: the patient was taken to the operating room placed under general anesthesia. Time Out was performed.  She was  Prepped and draped in the normal sterile fashion. A foley catheter was placed. A uterine manipulator was placed. Attention was turned to the abdomen where the umbilicus was injected with 10 cc of marcaine. A 10 mm trocar was placed under direct visualization. Pneumoperitoneum was achieved with C02 gas... A two  5 mm trocar was placed in the  left lower quadrant. Each trocar site was injected with 10 cc of marcaine prior to trocar placement. The harmonic scalpel was used to excise  The left fallopian tube was excised along the mesosalpinx to the cornu with the harmonic scalpel.  An endo catch bag was placed through the 10 mm umbilical port. The specimen was placed in the bag and removed through the  umbilical incision. Pneumoperitoneum was reestablished. The pelvis was irrigated. Excellent hemostasis was noted. All trocars were removed under direct visualization . The pneumoperitoneum was released.  The fascia of the umbilical incision was re approximated with 0 vicryl. The skin incisions were closed with 4-0 vicryl and derma bond.  the patient was taken to the recovery room awake and in stable condition.  Sponge lap and needle counts were correct times 2.

## 2021-11-18 NOTE — H&P (Signed)
History     [name]: [id number]  Arrival date and time: 11/18/21 1120   Event Date/Time   First Provider Initiated Contact with Patient 11/18/21 1309      Chief Complaint  Patient presents with   Abdominal Pain   Vaginal Bleeding   [name] is a 41 y.o. year old G6P1001 female at [redacted]w[redacted]d weeks gestation who presents to MAU reporting intermittent lower LT abdominal cramping and and occasional pressure; pain rated 4/10. She states "it feels like a pull when I lift my LT leg up." She also reports light pink spotting x 1 episode with wiping in the BR. She denies any recent SI. She is an established patient of [facility]; Dr. Alesia Richards. Her spouse is present and contributing to the history taking.    OB History     Gravida  2   Para  1   Term  1   Preterm      AB      Living  1      SAB      IAB      Ectopic      Multiple      Live Births  1           Past Medical History:  Diagnosis Date   Allergy    Anxiety    ASCVD (arteriosclerotic cardiovascular disease) 2011   Asthma    Blood type, Rh negative    Depression    GERD (gastroesophageal reflux disease)    H/O migraine 10/25/10   H/O toxoplasmosis    H/O varicella    pt denies    History of elevated lipids 2011   Hx: UTI (urinary tract infection) 2007   Hypertension    IBS (irritable bowel syndrome)    Irregular menstrual cycle 2004   Migraine    Obese    Sleep apnea    Weight gain 2004    Past Surgical History:  Procedure Laterality Date   BIOPSY  05/04/2021   Procedure: BIOPSY;  Surgeon: Otis Brace, MD;  Location: [facility];  Service: Gastroenterology;;   ESOPHAGOGASTRODUODENOSCOPY N/A 05/04/2021   Procedure: ESOPHAGOGASTRODUODENOSCOPY (EGD);  Surgeon: Otis Brace, MD;  Location: [facility];  Service: Gastroenterology;  Laterality: N/A;   gastroenteritis     WISDOM TOOTH EXTRACTION      Family History  Problem Relation Age of Onset   Heart disease Mother     Hypertension Mother    Cancer Mother    Hypertension Father    Anemia Father    Hypertension Maternal Grandmother    Heart disease Paternal Grandfather        MI   Cancer Maternal Uncle    Cancer Paternal Aunt    Alcohol abuse Paternal Uncle     Social History   Tobacco Use   Smoking status: Never   Smokeless tobacco: Never  Substance Use Topics   Alcohol use: No   Drug use: No    Allergies: No Known Allergies  Medications Prior to Admission  Medication Sig Dispense Refill Last Dose   folic acid (FOLVITE) 1 MG tablet Take 1 mg by mouth daily.   11/18/2021   NIFEdipine (PROCARDIA) 10 MG capsule Take 10 mg by mouth 3 (three) times daily.   11/18/2021   Prenatal Vit-Fe Fumarate-FA (PRENATAL MULTIVITAMIN) TABS tablet Take 1 tablet by mouth daily at 12 noon.   11/18/2021   ARIPiprazole (ABILIFY) 5 MG tablet Take 5 mg by mouth daily.  atorvastatin (LIPITOR) 20 MG tablet Take 20 mg by mouth daily.      Azelastine HCl 137 MCG/SPRAY SOLN Place 1 spray into both nostrils daily. (Patient not taking: Reported on 07/30/2021)      buPROPion (WELLBUTRIN XL) 300 MG 24 hr tablet Take 300 mg by mouth every morning.      busPIRone (BUSPAR) 15 MG tablet Take 15 mg by mouth 2 (two) times daily.      Cholecalciferol (VITAMIN D3) 1.25 MG (50000 UT) CAPS Take 1 capsule by mouth once a week. wednesday      desvenlafaxine (PRISTIQ) 100 MG 24 hr tablet Take 100 mg by mouth daily.      dicyclomine (BENTYL) 10 MG/5ML solution Take 5 mLs (10 mg total) by mouth 3 (three) times daily before meals. (Patient taking differently: Take 10 mg by mouth daily as needed (stomach discomfort).) 240 mL 12    esomeprazole (NEXIUM) 40 MG capsule Take 40 mg by mouth 2 (two) times daily.      famotidine (PEPCID) 40 MG tablet Take 40 mg by mouth daily.      hydrOXYzine (ATARAX) 25 MG tablet Take 1 mg by mouth See admin instructions. Take 25 mg by mouth 6 times a day      levocetirizine (XYZAL) 5 MG tablet Take 5 mg by mouth  every evening.      lisinopril (ZESTRIL) 5 MG tablet Take 5 mg by mouth daily.      metoCLOPramide (REGLAN) 10 MG tablet Take 1 tablet (10 mg total) by mouth 4 (four) times daily -  before meals and at bedtime. 90 tablet 0    montelukast (SINGULAIR) 10 MG tablet Take 10 mg by mouth daily.      pantoprazole (PROTONIX) 40 MG tablet Take 40 mg by mouth daily. (Patient not taking: Reported on 07/30/2021)      prochlorperazine (COMPAZINE) 5 MG tablet Take 2 tablets (10 mg total) by mouth every 8 (eight) hours as needed for up to 5 days for nausea or vomiting. (Patient not taking: Reported on 07/30/2021) 30 tablet 0    sucralfate (CARAFATE) 1 g tablet Take 1 tablet (1 g total) by mouth 4 (four) times daily. (Patient not taking: Reported on 07/30/2021) 120 tablet 1    SUMAtriptan (IMITREX) 50 MG tablet Take 1 tablet (50 mg total) by mouth once. May repeat in 2 hours if needed. No more than 2 per 24 hours. No more than 3 times per week. (Patient not taking: Reported on 05/03/2021) 10 tablet 3    traZODone (DESYREL) 50 MG tablet Take 1 tablet (50 mg total) by mouth at bedtime as needed for sleep. (Patient taking differently: Take 150 mg by mouth at bedtime as needed for sleep.) 30 tablet 0     Review of Systems  Constitutional: Negative.   HENT: Negative.    Eyes: Negative.   Respiratory: Negative.    Cardiovascular: Negative.   Gastrointestinal: Negative.   Endocrine: Negative.   Genitourinary:  Positive for pelvic pain (lower LT side, increase when lifts of LT leg, "feels like a pull and occasional pressure.") and vaginal bleeding (pink spotting with wiping x 1 episode).  Musculoskeletal: Negative.   Skin: Negative.   Allergic/Immunologic: Negative.   Neurological: Negative.   Hematological: Negative.   Psychiatric/Behavioral: Negative.     Physical Exam   Blood pressure 121/81, pulse 87, temperature 98.6 F (37 C), temperature source Oral, resp. rate 19, height 5\' 9"  (1.753 m), weight (!) 138 kg,  last  menstrual period 09/28/2021, SpO2 100 %.  Physical Exam Vitals and nursing note reviewed. Exam conducted with a chaperone present.  Constitutional:      Appearance: Normal appearance. She is obese.  Abdominal:     Palpations: Abdomen is soft.  Genitourinary:    General: Normal vulva.     Comments: Pelvic exam: External genitalia normal, SE: vaginal walls pink and well rugated, cervix is smooth, pink, no lesions, scant amt of clear to white vaginal d/c -- WP, GC/CT done, cervix closed/long/soft, Uterus is non-tender, no CMT or friability, mild LT adnexal tenderness with palpation. Musculoskeletal:        General: Normal range of motion.  Skin:    General: Skin is warm and dry.  Neurological:     Mental Status: She is alert and oriented to person, place, and time.  Psychiatric:        Mood and Affect: Mood normal.        Behavior: Behavior normal.        Thought Content: Thought content normal.        Judgment: Judgment normal.    MAU Course  Procedures  MDM [facility] UPT CBC ABO/Rh -- known O Neg HCG Wet Prep GC/CT -- pending HIV -- pending OB < 14 wks [place] with TV  *Consult with Dr. Richardson Dopp @ [phone] - notified of patient's complaints, assessments, lab & U/S results as verbally given by Dr. Neva Seat (radiologist)  orders received from Dr. Richardson Dopp to prep patient for OR  Results for orders placed or performed during the hospital encounter of 11/18/21 (from the past 24 hour(s))  Pregnancy, urine POC     Status: Abnormal   Collection Time: 11/18/21 11:46 AM  Result Value Ref Range   Preg Test, Ur POSITIVE (A) NEGATIVE  Urinalysis, Routine w reflex microscopic Urine, Clean Catch     Status: Abnormal   Collection Time: 11/18/21 11:51 AM  Result Value Ref Range   Color, Urine YELLOW YELLOW   APPearance HAZY (A) CLEAR   Specific Gravity, Urine 1.026 1.005 - 1.030   pH 5.0 5.0 - 8.0   Glucose, UA NEGATIVE NEGATIVE mg/dL   Hgb urine dipstick MODERATE (A) NEGATIVE   Bilirubin Urine  NEGATIVE NEGATIVE   Ketones, ur NEGATIVE NEGATIVE mg/dL   Protein, ur NEGATIVE NEGATIVE mg/dL   Nitrite NEGATIVE NEGATIVE   Leukocytes,Ua SMALL (A) NEGATIVE   RBC / HPF 0-5 0 - 5 RBC/hpf   WBC, UA 6-10 0 - 5 WBC/hpf   Bacteria, UA RARE (A) NONE SEEN   Squamous Epithelial / LPF 6-10 0 - 5   Mucus PRESENT   Wet prep, genital     Status: Abnormal   Collection Time: 11/18/21  1:19 PM  Result Value Ref Range   Yeast Wet Prep HPF POC NONE SEEN NONE SEEN   Trich, Wet Prep NONE SEEN NONE SEEN   Clue Cells Wet Prep HPF POC NONE SEEN NONE SEEN   WBC, Wet Prep HPF POC >=10 (A) <10   Sperm NONE SEEN   CBC     Status: None   Collection Time: 11/18/21  1:25 PM  Result Value Ref Range   WBC 5.4 4.0 - 10.5 K/uL   RBC 4.11 3.87 - 5.11 MIL/uL   Hemoglobin 12.3 12.0 - 15.0 g/dL   HCT 30.1 60.1 - 09.3 %   MCV 88.8 80.0 - 100.0 fL   MCH 29.9 26.0 - 34.0 pg   MCHC 33.7 30.0 - 36.0 g/dL   RDW  13.6 11.5 - 15.5 %   Platelets 240 150 - 400 K/uL   nRBC 0.0 0.0 - 0.2 %  hCG, quantitative, pregnancy     Status: Abnormal   Collection Time: 11/18/21  1:25 PM  Result Value Ref Range   hCG, Beta Chain, Quant, S 8,358 (H) <5 mIU/mL  Rh IG workup (includes ABO/Rh)     Status: None (Preliminary result)   Collection Time: 11/18/21  1:25 PM  Result Value Ref Range   Gestational Age(Wks) 7.2    ABO/RH(D) O NEG    Antibody Screen NEG    Unit Number [id number]    Blood Component Type RHIG    Unit division 00    Status of Unit ISSUED    Transfusion Status      OK TO TRANSFUSE Performed at [facility] [address], [place], [place] [address]     US OB LESS THAN 14 WEEKS WITH [place] TRANSVAGINAL  Result Date: 11/18/2021 CLINICAL DATA:  Abdominal pain. EXAM: OBSTETRIC <14 WK [place] AND TRANSVAGINAL OB US TECHNIQUE: Both transabdominal and transvaginal ultrasound examinations were performed for complete evaluation of the gestation as well as the maternal uterus, adnexal regions, and pelvic cul-de-sac.  Transvaginal technique was performed to assess early pregnancy. COMPARISON:  CT scan of Jul 30, 2021. FINDINGS: Intrauterine gestational sac: None Yolk sac:  Not Visualized. Embryo:  Not Visualized. Cardiac Activity: Not Visualized. Subchorionic hemorrhage:  None visualized. Maternal uterus/adnexae: Right ovary is unremarkable. Left ovary is not clearly visualized. There is a large complex masslike abnormality in the left adnexal region. This is concerning for possible ruptured ectopic pregnancy with associated hemorrhage. IMPRESSION: No evidence of intrauterine gestation. There is a large complex masslike abnormality in the left adnexal region which is concerning for hemorrhage secondary to possible ruptured ectopic pregnancy. Correlation with laboratory data is recommended. Critical Value/emergent results were called by telephone at the time of interpretation on 11/18/2021 at 3:53 pm to provider Laury Deep , who verbally acknowledged these results. Electronically Signed   By: Marijo Conception M.D.   On: 11/18/2021 15:53     Assessment and Plan  1. Ruptured left tubal ectopic pregnancy causing hemoperitoneum - To OR for laparoscopic removal of LT adnexal ruptured ectopic pregnancy - Dr. Landry Mellow assumes care of patient at 1555  2. [redacted] weeks gestation of pregnancy    Laury Deep, CNM 11/18/2021, 1:09 PM

## 2021-11-18 NOTE — Transfer of Care (Signed)
Immediate Anesthesia Transfer of Care Note  Patient: Monique Shaw  Procedure(s) Performed: LAPAROSCOPIC LEFT SALPINGECTOMY WITH REMOVAL OF ECTOPIC PREGNANCY (Left)  Patient Location: PACU  Anesthesia Type:General  Level of Consciousness: awake, alert  and oriented  Airway & Oxygen Therapy: Patient Spontanous Breathing  Post-op Assessment: Report given to RN and Post -op Vital signs reviewed and stable  Post vital signs: Reviewed and stable  Last Vitals:  Vitals Value Taken Time  BP 120/72 11/18/21 2000  Temp 36 C 11/18/21 1945  Pulse 67 11/18/21 2007  Resp 14 11/18/21 2007  SpO2 100 % 11/18/21 2007  Vitals shown include unvalidated device data.  Last Pain:  Vitals:   11/18/21 1945  TempSrc:   PainSc: 6       Patients Stated Pain Goal: 2 (11/18/21 1945)  Complications: No notable events documented.

## 2021-11-19 ENCOUNTER — Encounter (HOSPITAL_COMMUNITY): Payer: Self-pay | Admitting: Obstetrics and Gynecology

## 2021-11-19 LAB — RH IG WORKUP (INCLUDES ABO/RH)
ABO/RH(D): O NEG
Antibody Screen: NEGATIVE
Gestational Age(Wks): 7.2
Unit division: 0

## 2021-11-21 LAB — GC/CHLAMYDIA PROBE AMP (~~LOC~~) NOT AT ARMC
Chlamydia: NEGATIVE
Comment: NEGATIVE
Comment: NORMAL
Neisseria Gonorrhea: NEGATIVE

## 2021-11-21 LAB — SURGICAL PATHOLOGY

## 2022-05-31 ENCOUNTER — Other Ambulatory Visit: Payer: Self-pay

## 2022-05-31 ENCOUNTER — Encounter (HOSPITAL_BASED_OUTPATIENT_CLINIC_OR_DEPARTMENT_OTHER): Payer: Self-pay | Admitting: Pediatrics

## 2022-05-31 ENCOUNTER — Emergency Department (HOSPITAL_BASED_OUTPATIENT_CLINIC_OR_DEPARTMENT_OTHER)
Admission: EM | Admit: 2022-05-31 | Discharge: 2022-05-31 | Disposition: A | Discharge status: Home / Self Care | Payer: BC Managed Care – PPO | Attending: Emergency Medicine | Admitting: Emergency Medicine

## 2022-05-31 ENCOUNTER — Emergency Department (HOSPITAL_BASED_OUTPATIENT_CLINIC_OR_DEPARTMENT_OTHER): Payer: BC Managed Care – PPO

## 2022-05-31 DIAGNOSIS — R1084 Generalized abdominal pain: Secondary | ICD-10-CM | POA: Insufficient documentation

## 2022-05-31 DIAGNOSIS — R110 Nausea: Secondary | ICD-10-CM

## 2022-05-31 DIAGNOSIS — I10 Essential (primary) hypertension: Secondary | ICD-10-CM | POA: Insufficient documentation

## 2022-05-31 DIAGNOSIS — K5900 Constipation, unspecified: Secondary | ICD-10-CM | POA: Insufficient documentation

## 2022-05-31 DIAGNOSIS — R112 Nausea with vomiting, unspecified: Secondary | ICD-10-CM | POA: Insufficient documentation

## 2022-05-31 LAB — COMPREHENSIVE METABOLIC PANEL
ALT: 14 U/L (ref 0–44)
AST: 22 U/L (ref 15–41)
Albumin: 4.1 g/dL (ref 3.5–5.0)
Alkaline Phosphatase: 75 U/L (ref 38–126)
Anion gap: 9 (ref 5–15)
BUN: 21 mg/dL — ABNORMAL HIGH (ref 6–20)
CO2: 21 mmol/L — ABNORMAL LOW (ref 22–32)
Calcium: 8.9 mg/dL (ref 8.9–10.3)
Chloride: 104 mmol/L (ref 98–111)
Creatinine, Ser: 1.12 mg/dL — ABNORMAL HIGH (ref 0.44–1.00)
GFR, Estimated: >60 mL/min (ref >60)
Glucose, Bld: 125 mg/dL — ABNORMAL HIGH (ref 70–99)
Potassium: 3.7 mmol/L (ref 3.5–5.1)
Sodium: 134 mmol/L — ABNORMAL LOW (ref 135–145)
Total Bilirubin: 1.1 mg/dL (ref 0.3–1.2)
Total Protein: 7.8 g/dL (ref 6.5–8.1)

## 2022-05-31 LAB — CBC
HCT: 42.7 % (ref 36.0–46.0)
Hemoglobin: 14.4 g/dL (ref 12.0–15.0)
MCH: 29.6 pg (ref 26.0–34.0)
MCHC: 33.7 g/dL (ref 30.0–36.0)
MCV: 87.7 fL (ref 80.0–100.0)
Platelets: 278 10*3/uL (ref 150–400)
RBC: 4.87 MIL/uL (ref 3.87–5.11)
RDW: 13.3 % (ref 11.5–15.5)
WBC: 5.3 10*3/uL (ref 4.0–10.5)
nRBC: 0 % (ref 0.0–0.2)

## 2022-05-31 LAB — URINALYSIS, MICROSCOPIC (REFLEX)

## 2022-05-31 LAB — URINALYSIS, ROUTINE W REFLEX MICROSCOPIC
Glucose, UA: 100 mg/dL — AB
Hgb urine dipstick: NEGATIVE
Ketones, ur: 80 mg/dL — AB
Leukocytes,Ua: NEGATIVE
Nitrite: NEGATIVE
Protein, ur: 100 mg/dL — AB
Specific Gravity, Urine: >=1.03 (ref 1.005–1.030)
pH: 5.5 (ref 5.0–8.0)

## 2022-05-31 LAB — LIPASE, BLOOD: Lipase: 34 U/L (ref 11–51)

## 2022-05-31 LAB — PREGNANCY, URINE: Preg Test, Ur: NEGATIVE

## 2022-05-31 MED ORDER — METOCLOPRAMIDE HCL 5 MG/ML IJ SOLN
10.0000 mg | Freq: Once | INTRAMUSCULAR | Status: AC
  Administered 2022-05-31: 10 mg via INTRAVENOUS
  Filled 2022-05-31: qty 2

## 2022-05-31 MED ORDER — ONDANSETRON HCL 4 MG/2ML IJ SOLN
4.0000 mg | Freq: Once | INTRAMUSCULAR | Status: AC
  Administered 2022-05-31: 4 mg via INTRAVENOUS
  Filled 2022-05-31: qty 2

## 2022-05-31 MED ORDER — LACTATED RINGERS IV BOLUS
1000.0000 mL | Freq: Once | INTRAVENOUS | Status: AC
  Administered 2022-05-31: 1000 mL via INTRAVENOUS

## 2022-05-31 MED ORDER — POLYETHYLENE GLYCOL 3350 17 GM/SCOOP PO POWD: 1.0000 | Freq: Once | ORAL | 0 refills | Status: AC

## 2022-05-31 MED ORDER — IOHEXOL 300 MG/ML  SOLN
125.0000 mL | Freq: Once | INTRAMUSCULAR | Status: AC | PRN
  Administered 2022-05-31: 125 mL via INTRAVENOUS

## 2022-05-31 NOTE — Discharge Instructions (Signed)
You are seen today in the emergency department due to nausea, vomiting constipation.  CT scan was negative for any emergent process, you have leiomyomas and should follow-up with your gynecologist regarding this.  Buy MiraLAX, combine 217 g with 2 L Gatorade bottles and drink 1 cup every hour until you are having regular bowel movements.  Follow-up with your GI doctor next week for return to the ED for new or concerning symptoms.

## 2022-05-31 NOTE — ED Triage Notes (Signed)
C/O NV and constipation since Tuesday along with some abdominal discomfort;

## 2022-05-31 NOTE — ED Provider Notes (Signed)
[facility] Provider Note   [name]: [id number] Arrival date & time: 05/31/22  1303     History  Chief Complaint  Patient presents with   Abdominal Pain   Constipation   Nausea   Emesis    Monique Shaw is a 42 y.o. female.   Abdominal Pain Associated symptoms: constipation and vomiting   Constipation Associated symptoms: abdominal pain and vomiting   Emesis Associated symptoms: abdominal pain      Patient with medical history of GERD, IBS, OSA, hypertension, anxiety presents to the emergency department due to nausea, vomiting and constipation.  Symptoms started 5 to 6 days ago over the weekend initially with constipation, typically she has diarrhea but she was only having hard stools which is very irregular for her.  She had to manually disimpact herself Tuesday which helped shortly but since Wednesday she has not had a bowel movement or passed gas.  Started having nausea and vomiting yesterday, seen in urgent care sent to ED for further evaluation.  Patient started he weglovy for weight loss 2 weeks ago, on a low-dose titrating upwards.  Patient is on fertility medication currently trying to get pregnant.  History of left salpingectomy, no other abdominal surgeries.  Denies any chest pain, shortness of breath, vaginal discharge, pelvic pain.  Home Medications Prior to Admission medications   Medication Sig Start Date End Date Taking? Authorizing Provider  Cholecalciferol (VITAMIN D3) 1.25 MG (50000 UT) CAPS Take 1 capsule by mouth once a week. wednesday 02/21/21   [provider]  dicyclomine (BENTYL) 10 MG/5ML solution Take 5 mLs (10 mg total) by mouth 3 (three) times daily before meals. Patient not taking: Reported on 11/18/2021 05/05/21   Nita Sells, MD  folic acid (FOLVITE) 1 MG tablet Take 1 mg by mouth daily.    [provider]  ibuprofen (ADVIL) 800 MG tablet Take 1 tablet (800 mg total) by mouth  every 8 (eight) hours as needed. 11/18/21   Christophe Louis, MD  metoCLOPramide (REGLAN) 10 MG tablet Take 1 tablet (10 mg total) by mouth 4 (four) times daily -  before meals and at bedtime. Patient not taking: Reported on 11/18/2021 08/01/21   Allie Bossier, MD  NIFEdipine (ADALAT CC) 30 MG 24 hr tablet Take 30 mg by mouth daily. 10/13/21   [provider]  oxyCODONE (OXY IR/ROXICODONE) 5 MG immediate release tablet Take 1 tablet (5 mg total) by mouth every 6 (six) hours as needed for severe pain. 11/18/21   Christophe Louis, MD  Prenatal Vit-Fe Fumarate-FA (PRENATAL MULTIVITAMIN) TABS tablet Take 1 tablet by mouth daily at 12 noon.    [provider]  prochlorperazine (COMPAZINE) 5 MG tablet Take 2 tablets (10 mg total) by mouth every 8 (eight) hours as needed for up to 5 days for nausea or vomiting. Patient not taking: Reported on 07/30/2021 05/05/21 05/10/21  Nita Sells, MD  sucralfate (CARAFATE) 1 g tablet Take 1 tablet (1 g total) by mouth 4 (four) times daily. Patient not taking: Reported on 07/30/2021 05/05/21 05/05/22  Nita Sells, MD  SUMAtriptan (IMITREX) 50 MG tablet Take 1 tablet (50 mg total) by mouth once. May repeat in 2 hours if needed. No more than 2 per 24 hours. No more than 3 times per week. Patient not taking: Reported on 05/03/2021 01/17/16 05/17/16  McVey, Gelene Mink, PA-C  traZODone (DESYREL) 50 MG tablet Take 1 tablet (50 mg total) by mouth at bedtime as needed  for sleep. Patient not taking: Reported on 11/18/2021 05/05/21   Nita Sells, MD      Allergies    Patient has no known allergies.    Review of Systems   Review of Systems  Gastrointestinal:  Positive for abdominal pain, constipation and vomiting.    Physical Exam Updated Vital Signs BP (!) 135/99 (BP Location: Left Arm)   Pulse 73   Temp 98.5 F (36.9 C) (Oral)   Resp 18   Ht '5\' 9"'$  (1.753 m)   Wt 135.2 kg   LMP 05/07/2022   SpO2 100%   Breastfeeding Unknown   BMI 44.01  kg/m  Physical Exam Vitals and nursing note reviewed. Exam conducted with a chaperone present.  Constitutional:      Appearance: Normal appearance.  HENT:     Head: Normocephalic and atraumatic.  Eyes:     General: No scleral icterus.       Right eye: No discharge.        Left eye: No discharge.     Extraocular Movements: Extraocular movements intact.     Pupils: Pupils are equal, round, and reactive to light.  Cardiovascular:     Rate and Rhythm: Normal rate and regular rhythm.     Pulses: Normal pulses.     Heart sounds: Normal heart sounds. No murmur heard.    No friction rub. No gallop.  Pulmonary:     Effort: Pulmonary effort is normal. No respiratory distress.     Breath sounds: Normal breath sounds.  Abdominal:     General: Abdomen is flat. Bowel sounds are normal. There is no distension.     Palpations: Abdomen is soft.     Tenderness: There is generalized abdominal tenderness.  Skin:    General: Skin is warm and dry.     Coloration: Skin is not jaundiced.  Neurological:     Mental Status: She is alert. Mental status is at baseline.     Coordination: Coordination normal.     ED Results / Procedures / Treatments   Labs (all labs ordered are listed, but only abnormal results are displayed) Labs Reviewed  COMPREHENSIVE METABOLIC PANEL - Abnormal; Notable for the following components:      Result Value   Sodium 134 (*)    CO2 21 (*)    Glucose, Bld 125 (*)    BUN 21 (*)    Creatinine, Ser 1.12 (*)    All other components within normal limits  LIPASE, BLOOD  CBC  URINALYSIS, ROUTINE W REFLEX MICROSCOPIC  PREGNANCY, URINE    EKG None  Radiology No results found.  Procedures Procedures    Medications Ordered in ED Medications  ondansetron (ZOFRAN) injection 4 mg (has no administration in time range)    ED Course/ Medical Decision Making/ A&P                             Medical Decision Making Amount and/or Complexity of Data Reviewed Labs:  ordered. Radiology: ordered.  Risk Prescription drug management.   Patient presents due to constipation, nausea and vomiting.  Differential includes not limited to small bowel obstruction, electrolyte derangement, AKI, dehydration, ectopic pregnancy, mass, fecal impaction, stercoral colitis, medication adverse effect.  Patient's husband is at bedside providing dependent history.  Also reviewed external medical records including surgical history.  I ordered, viewed, and  interpreted laboratory workup.  CBC without leukocytosis or anemia.  CMP without gross electrolyte derangement, mild  AKI with a creatinine 1.12.  Lipase within normal limits, not consistent pancreatitis.  UA is notable for ketonuria and proteinuria consistent with dehydration, no underlying UTI.  Patient is not pregnant, not ectopic.  CT abdomen and pelvis was ordered to evaluate for obstruction, negative for obstructive process, mass.  She does have leiomyomas which I discussed with the patient we have a copy of the CT scan results.  Suspect functional constipation, will have patient try bowel prep, GI follow-up and return precautions discussed.        Final Clinical Impression(s) / ED Diagnoses Final diagnoses:  None    Rx / DC Orders ED Discharge Orders     None         Sherrill Raring, Hershal Coria 05/31/22 2247    Drenda Freeze, MD 06/01/22 [phone]

## 2022-10-17 ENCOUNTER — Encounter (HOSPITAL_COMMUNITY): Payer: Self-pay | Admitting: Family Medicine

## 2022-10-17 ENCOUNTER — Other Ambulatory Visit: Payer: Self-pay

## 2022-10-17 ENCOUNTER — Observation Stay (HOSPITAL_BASED_OUTPATIENT_CLINIC_OR_DEPARTMENT_OTHER)
Admission: EM | Admit: 2022-10-17 | Discharge: 2022-10-18 | Disposition: A | Discharge status: Home / Self Care | Payer: BC Managed Care – PPO | Attending: Student | Admitting: Student

## 2022-10-17 DIAGNOSIS — J45909 Unspecified asthma, uncomplicated: Secondary | ICD-10-CM | POA: Diagnosis not present

## 2022-10-17 DIAGNOSIS — F419 Anxiety disorder, unspecified: Secondary | ICD-10-CM | POA: Diagnosis present

## 2022-10-17 DIAGNOSIS — F32A Depression, unspecified: Secondary | ICD-10-CM | POA: Diagnosis present

## 2022-10-17 DIAGNOSIS — D259 Leiomyoma of uterus, unspecified: Secondary | ICD-10-CM | POA: Insufficient documentation

## 2022-10-17 DIAGNOSIS — F320 Major depressive disorder, single episode, mild: Secondary | ICD-10-CM | POA: Diagnosis not present

## 2022-10-17 DIAGNOSIS — I10 Essential (primary) hypertension: Secondary | ICD-10-CM | POA: Diagnosis not present

## 2022-10-17 DIAGNOSIS — E785 Hyperlipidemia, unspecified: Secondary | ICD-10-CM | POA: Diagnosis present

## 2022-10-17 DIAGNOSIS — R112 Nausea with vomiting, unspecified: Principal | ICD-10-CM | POA: Diagnosis present

## 2022-10-17 DIAGNOSIS — Z79899 Other long term (current) drug therapy: Secondary | ICD-10-CM | POA: Diagnosis not present

## 2022-10-17 DIAGNOSIS — E66813 Obesity, class 3: Secondary | ICD-10-CM | POA: Diagnosis present

## 2022-10-17 DIAGNOSIS — G43009 Migraine without aura, not intractable, without status migrainosus: Secondary | ICD-10-CM | POA: Diagnosis present

## 2022-10-17 DIAGNOSIS — G4733 Obstructive sleep apnea (adult) (pediatric): Secondary | ICD-10-CM | POA: Diagnosis present

## 2022-10-17 DIAGNOSIS — E7800 Pure hypercholesterolemia, unspecified: Secondary | ICD-10-CM

## 2022-10-17 LAB — CBC
HCT: 46.4 % — ABNORMAL HIGH (ref 36.0–46.0)
Hemoglobin: 15.7 g/dL — ABNORMAL HIGH (ref 12.0–15.0)
MCH: 29.4 pg (ref 26.0–34.0)
MCHC: 33.8 g/dL (ref 30.0–36.0)
MCV: 86.9 fL (ref 80.0–100.0)
Platelets: 269 10*3/uL (ref 150–400)
RBC: 5.34 MIL/uL — ABNORMAL HIGH (ref 3.87–5.11)
RDW: 13.8 % (ref 11.5–15.5)
WBC: 10 10*3/uL (ref 4.0–10.5)
nRBC: 0 % (ref 0.0–0.2)

## 2022-10-17 LAB — COMPREHENSIVE METABOLIC PANEL
ALT: 14 U/L (ref 0–44)
AST: 22 U/L (ref 15–41)
Albumin: 4.2 g/dL (ref 3.5–5.0)
Alkaline Phosphatase: 85 U/L (ref 38–126)
Anion gap: 12 (ref 5–15)
BUN: 14 mg/dL (ref 6–20)
CO2: 23 mmol/L (ref 22–32)
Calcium: 9.4 mg/dL (ref 8.9–10.3)
Chloride: 102 mmol/L (ref 98–111)
Creatinine, Ser: 0.97 mg/dL (ref 0.44–1.00)
GFR, Estimated: >60 mL/min (ref >60)
Glucose, Bld: 170 mg/dL — ABNORMAL HIGH (ref 70–99)
Potassium: 3.7 mmol/L (ref 3.5–5.1)
Sodium: 137 mmol/L (ref 135–145)
Total Bilirubin: 0.8 mg/dL (ref 0.3–1.2)
Total Protein: 8 g/dL (ref 6.5–8.1)

## 2022-10-17 LAB — URINALYSIS, ROUTINE W REFLEX MICROSCOPIC
Glucose, UA: NEGATIVE mg/dL
Hgb urine dipstick: NEGATIVE
Ketones, ur: 15 mg/dL — AB
Nitrite: NEGATIVE
Protein, ur: 100 mg/dL — AB
Specific Gravity, Urine: >=1.03 (ref 1.005–1.030)
pH: 6 (ref 5.0–8.0)

## 2022-10-17 LAB — TROPONIN I (HIGH SENSITIVITY)
Troponin I (High Sensitivity): 3 ng/L (ref <18)
Troponin I (High Sensitivity): 3 ng/L (ref <18)

## 2022-10-17 LAB — URINALYSIS, MICROSCOPIC (REFLEX)

## 2022-10-17 LAB — LIPASE, BLOOD: Lipase: 28 U/L (ref 11–51)

## 2022-10-17 LAB — PREGNANCY, URINE: Preg Test, Ur: NEGATIVE

## 2022-10-17 MED ORDER — PANTOPRAZOLE SODIUM 40 MG IV SOLR
40.0000 mg | Freq: Once | INTRAVENOUS | Status: AC
  Administered 2022-10-17: 40 mg via INTRAVENOUS
  Filled 2022-10-17: qty 10

## 2022-10-17 MED ORDER — LORAZEPAM 2 MG/ML IJ SOLN: 1.0000 mg | Freq: Once | INTRAMUSCULAR | Status: DC | PRN

## 2022-10-17 MED ORDER — SODIUM CHLORIDE 0.9 % IV SOLN
12.5000 mg | Freq: Once | INTRAVENOUS | Status: AC
  Administered 2022-10-17: 12.5 mg via INTRAVENOUS
  Filled 2022-10-17: qty 0.5

## 2022-10-17 MED ORDER — ONDANSETRON HCL 4 MG/2ML IJ SOLN: 4.0000 mg | Freq: Four times a day (QID) | INTRAMUSCULAR | Status: DC | PRN

## 2022-10-17 MED ORDER — ENOXAPARIN SODIUM 40 MG/0.4ML IJ SOSY
40.0000 mg | PREFILLED_SYRINGE | INTRAMUSCULAR | Status: DC
  Administered 2022-10-17: 40 mg via SUBCUTANEOUS
  Filled 2022-10-17: qty 0.4

## 2022-10-17 MED ORDER — PROMETHAZINE HCL 25 MG/ML IJ SOLN
INTRAMUSCULAR | Status: AC
  Filled 2022-10-17: qty 1

## 2022-10-17 MED ORDER — SODIUM CHLORIDE 0.9% FLUSH
3.0000 mL | Freq: Two times a day (BID) | INTRAVENOUS | Status: DC
  Administered 2022-10-17: 3 mL via INTRAVENOUS

## 2022-10-17 MED ORDER — MORPHINE SULFATE (PF) 2 MG/ML IV SOLN: 1.0000 mg | Freq: Four times a day (QID) | INTRAVENOUS | Status: DC | PRN

## 2022-10-17 MED ORDER — POTASSIUM CHLORIDE IN NACL 20-0.9 MEQ/L-% IV SOLN
INTRAVENOUS | Status: DC
  Filled 2022-10-17: qty 1000

## 2022-10-17 MED ORDER — LORAZEPAM 2 MG/ML IJ SOLN
1.0000 mg | Freq: Once | INTRAMUSCULAR | Status: AC
  Administered 2022-10-17: 1 mg via INTRAVENOUS
  Filled 2022-10-17: qty 1

## 2022-10-17 MED ORDER — SODIUM CHLORIDE 0.9 % IV BOLUS
1000.0000 mL | Freq: Once | INTRAVENOUS | Status: AC
  Administered 2022-10-17: 1000 mL via INTRAVENOUS

## 2022-10-17 MED ORDER — PROCHLORPERAZINE EDISYLATE 10 MG/2ML IJ SOLN
10.0000 mg | Freq: Once | INTRAMUSCULAR | Status: AC
  Administered 2022-10-17: 10 mg via INTRAVENOUS
  Filled 2022-10-17: qty 2

## 2022-10-17 MED ORDER — SODIUM CHLORIDE 0.9 % IV SOLN: INTRAVENOUS | Status: AC

## 2022-10-17 MED ORDER — ACETAMINOPHEN 650 MG RE SUPP: 650.0000 mg | Freq: Four times a day (QID) | RECTAL | Status: DC | PRN

## 2022-10-17 MED ORDER — ACETAMINOPHEN 325 MG PO TABS: 650.0000 mg | ORAL_TABLET | Freq: Four times a day (QID) | ORAL | Status: DC | PRN

## 2022-10-17 MED ORDER — ONDANSETRON HCL 4 MG/2ML IJ SOLN
4.0000 mg | Freq: Once | INTRAMUSCULAR | Status: AC
  Administered 2022-10-17: 4 mg via INTRAVENOUS
  Filled 2022-10-17: qty 2

## 2022-10-17 MED ORDER — POLYETHYLENE GLYCOL 3350 17 G PO PACK: 17.0000 g | PACK | Freq: Every day | ORAL | Status: DC | PRN

## 2022-10-17 MED ORDER — MORPHINE SULFATE (PF) 2 MG/ML IV SOLN: 1.0000 mg | INTRAVENOUS | Status: DC | PRN

## 2022-10-17 MED ORDER — ALPRAZOLAM 0.5 MG PO TABS
1.0000 mg | ORAL_TABLET | Freq: Every day | ORAL | Status: DC | PRN
  Administered 2022-10-17: 1 mg via ORAL
  Filled 2022-10-17: qty 2

## 2022-10-17 MED ORDER — PROMETHAZINE HCL 25 MG PO TABS: 12.5000 mg | ORAL_TABLET | Freq: Four times a day (QID) | ORAL | Status: DC | PRN

## 2022-10-17 MED ORDER — PANTOPRAZOLE SODIUM 40 MG IV SOLR
40.0000 mg | INTRAVENOUS | Status: DC
  Administered 2022-10-17: 40 mg via INTRAVENOUS
  Filled 2022-10-17: qty 10

## 2022-10-17 NOTE — H&P (Signed)
History and Physical   Monique Shaw:[id number] DOB: 07-08-1980 DOA: 10/17/2022  PCP: Harvie Heck, MD   Patient coming from: Home  Chief Complaint: Nausea and vomiting  HPI: Monique Shaw is a 42 y.o. female with medical history significant of hypertension, hyperlipidemia, asthma, GERD, OSA, obesity, depression, anxiety, migraines, nausea and vomiting presenting with recurrent nausea and vomiting.  Patient reports recurrent episode of nausea vomiting.  Has had similar history of the same in prior admissions.  She states that for the past day or so she has been unable to tolerate any p.o. intake.  Reports nausea and vomiting about every 30 minutes.  Some brown emesis with possible bloody streaks.  States has been similar to previous episodes.  Was admitted to outside hospital last week.  Does follow with GI outpatient and plans to start alternative PPI soon.  Patient uncomfortable going home with persistent symptoms and is requesting observation and possible evaluation for EGD.  Previous EGD a couple years ago showed gastritis and esophagitis.  Denies fevers, chills, chest pain, shortness of breath.  ED Course: Vital signs in the ED notable for blood pressure in the 100s to 140 systolic.  Lab workup included CMP with glucose 170.  CBC showed hemoglobin mildly elevated at 15.7.  Lipase normal.  Troponin negative x 2.  Urinalysis with bilirubin, ketones, protein, leukocytes, rare bacteria.  Patient received morphine, Ativan, Zofran, Compazine, Phenergan in the ED.  Also received IV PPI and a liter of IV fluids.  No imaging today but had a CT with similar symptoms in March which looked okay.  Message sent to GI, Deboraha Sprang, for consultation when patient arrives.  Review of Systems: As per HPI otherwise all other systems reviewed and are negative.  Past Medical History:  Diagnosis Date   Allergy    Anxiety    ASCVD (arteriosclerotic cardiovascular disease) 2011   Asthma    Blood type, Rh  negative    Depression    GERD (gastroesophageal reflux disease)    H/O migraine 10/25/2010   H/O toxoplasmosis    H/O varicella    pt denies    History of elevated lipids 2011   Hx: UTI (urinary tract infection) 2007   Hypertension    IBS (irritable bowel syndrome)    Irregular menstrual cycle 2004   Migraine    Nausea & vomiting 04/27/2021   Obese    Sleep apnea    Weight gain 2004    Past Surgical History:  Procedure Laterality Date   BIOPSY  05/04/2021   Procedure: BIOPSY;  Surgeon: Kathi Der, MD;  Location: [facility];  Service: Gastroenterology;;   ESOPHAGOGASTRODUODENOSCOPY N/A 05/04/2021   Procedure: ESOPHAGOGASTRODUODENOSCOPY (EGD);  Surgeon: Kathi Der, MD;  Location: [facility];  Service: Gastroenterology;  Laterality: N/A;   gastroenteritis     LAPAROSCOPIC BILATERAL SALPINGECTOMY Left 11/18/2021   Procedure: LAPAROSCOPIC LEFT SALPINGECTOMY WITH REMOVAL OF ECTOPIC PREGNANCY;  Surgeon: Gerald Leitz, MD;  Location: [facility];  Service: Gynecology;  Laterality: Left;   WISDOM TOOTH EXTRACTION      Social History  reports that she has never smoked. She has never used smokeless tobacco. She reports that she does not drink alcohol and does not use drugs.  No Known Allergies  Family History  Problem Relation Age of Onset   Heart disease Mother    Hypertension Mother    Cancer Mother    Hypertension Father    Anemia Father    Hypertension Maternal Grandmother    Heart  disease Paternal Grandfather        MI   Cancer Maternal Uncle    Cancer Paternal Aunt    Alcohol abuse Paternal Uncle   Reviewed on admission  Prior to Admission medications   Medication Sig Start Date End Date Taking? Authorizing Provider  ALPRAZolam Prudy Feeler) 1 MG tablet Take 1 mg by mouth daily as needed for sleep. 07/18/22  Yes [provider]  ARIPiprazole (ABILIFY) 5 MG tablet Take 5 mg by mouth at bedtime. 08/30/22  Yes [provider]  buPROPion (WELLBUTRIN XL)  150 MG 24 hr tablet Take 450 mg by mouth daily. 08/05/22  Yes [provider]  busPIRone (BUSPAR) 15 MG tablet Take 15 mg by mouth 2 (two) times daily. 08/05/22  Yes [provider]  Cholecalciferol (VITAMIN D3) 1.25 MG (50000 UT) CAPS Take 50,000 Units by mouth once a week. Wednesdays 02/21/21  Yes [provider]  Coenzyme Q10 100 MG capsule Take 100 mg by mouth daily. 04/26/22 04/26/23 Yes [provider]  Diethylpropion HCl CR 75 MG TB24 Take 75 mg by mouth daily. 09/14/22  Yes [provider]  famotidine (PEPCID) 40 MG tablet Take 40 mg by mouth daily. 10/16/22  Yes [provider]  FLUoxetine (PROZAC) 40 MG capsule Take 40 mg by mouth daily. 08/30/22  Yes [provider]  folic acid (FOLVITE) 1 MG tablet Take 1 mg by mouth daily.   Yes [provider]  hydrOXYzine (ATARAX) 25 MG tablet Take 25 mg by mouth daily as needed for itching. 09/24/22  Yes [provider]  letrozole (FEMARA) 2.5 MG tablet Take 5 mg by mouth daily. 09/07/22  Yes [provider]  magnesium oxide (MAG-OX) 400 (240 Mg) MG tablet Take 400 mg by mouth daily. 04/28/22  Yes [provider]  NIFEdipine (ADALAT CC) 30 MG 24 hr tablet Take 30 mg by mouth daily. 10/13/21  Yes [provider]  ondansetron (ZOFRAN-ODT) 4 MG disintegrating tablet Take 4 mg by mouth as needed for nausea or vomiting. 10/08/22  Yes [provider]  pantoprazole (PROTONIX) 40 MG tablet Take 40 mg by mouth 2 (two) times daily. 10/08/22  Yes [provider]  Prenatal Vit-Fe Fumarate-FA (PRENATAL MULTIVITAMIN) TABS tablet Take 1 tablet by mouth daily at 12 noon.   Yes [provider]  prochlorperazine (COMPAZINE) 5 MG tablet Take 2 tablets (10 mg total) by mouth every 8 (eight) hours as needed for up to 5 days for nausea or vomiting. Patient taking differently: Take 10 mg by mouth as needed for nausea or vomiting. 05/05/21 10/17/22 Yes Rhetta Mura, MD  promethazine (PHENERGAN) 25 MG tablet Take 25 mg by mouth every 6 (six) hours as needed. 10/16/22  Yes [provider]  PROMETHEGAN 25 MG suppository Place 25 mg rectally every 6 (six) hours as needed for nausea or vomiting. 06/05/22  Yes [provider]  RABEprazole (ACIPHEX) 20 MG tablet Take 20 mg by mouth 2 (two) times daily. 08/05/22  Yes [provider]  UBRELVY 100 MG TABS Take 100 mg by mouth as needed (migraine). 08/31/22  Yes [provider]  metoCLOPramide (REGLAN) 10 MG tablet Take 1 tablet (10 mg total) by mouth 4 (four) times daily -  before meals and at bedtime. Patient not taking: Reported on 11/18/2021 08/01/21   Drema Dallas, MD  traZODone (DESYREL) 50 MG tablet Take 1 tablet (50 mg total) by mouth at bedtime as needed for sleep. Patient not taking: Reported  on 11/18/2021 05/05/21   Rhetta Mura, MD    Physical Exam: Vitals:   10/17/22 1200 10/17/22 1235 10/17/22 1330 10/17/22 1700  BP: 112/75 (!) 136/91 126/74 108/72  Pulse: 84  88 70  Resp:   18 18  Temp:   98 F (36.7 C) 97.9 F (36.6 C)  TempSrc:    Oral  SpO2: 99%  100% 100%  Weight:        Physical Exam Constitutional:      General: She is not in acute distress.    Appearance: Normal appearance. She is obese.  HENT:     Head: Normocephalic and atraumatic.     Mouth/Throat:     Mouth: Mucous membranes are moist.     Pharynx: Oropharynx is clear.  Eyes:     Extraocular Movements: Extraocular movements intact.     Pupils: Pupils are equal, round, and reactive to light.  Cardiovascular:     Rate and Rhythm: Normal rate and regular rhythm.     Pulses: Normal pulses.     Heart sounds: Normal heart sounds.  Pulmonary:     Effort: Pulmonary effort is normal. No respiratory distress.     Breath sounds: Normal breath sounds.  Abdominal:     General: Bowel sounds are normal. There is no distension.     Palpations: Abdomen is soft.     Tenderness: There  is no abdominal tenderness.  Musculoskeletal:        General: No swelling or deformity.  Skin:    General: Skin is warm and dry.  Neurological:     General: No focal deficit present.     Mental Status: Mental status is at baseline.    Labs on Admission: I have personally reviewed following labs and imaging studies  CBC: Recent Labs  Lab 10/17/22 0459  WBC 10.0  HGB 15.7*  HCT 46.4*  MCV 86.9  PLT 269    Basic Metabolic Panel: Recent Labs  Lab 10/17/22 0459  NA 137  K 3.7  CL 102  CO2 23  GLUCOSE 170*  BUN 14  CREATININE 0.97  CALCIUM 9.4    GFR: Estimated Creatinine Clearance: 110.6 mL/min (by C-G formula based on SCr of 0.97 mg/dL).  Liver Function Tests: Recent Labs  Lab 10/17/22 0459  AST 22  ALT 14  ALKPHOS 85  BILITOT 0.8  PROT 8.0  ALBUMIN 4.2    Urine analysis:    Component Value Date/Time   COLORURINE YELLOW 10/17/2022 0459   APPEARANCEUR HAZY (A) 10/17/2022 0459   LABSPEC >=1.030 10/17/2022 0459   PHURINE 6.0 10/17/2022 0459   GLUCOSEU NEGATIVE 10/17/2022 0459   HGBUR NEGATIVE 10/17/2022 0459   BILIRUBINUR SMALL (A) 10/17/2022 0459   BILIRUBINUR negative 01/11/2013 1133   KETONESUR 15 (A) 10/17/2022 0459   PROTEINUR 100 (A) 10/17/2022 0459   UROBILINOGEN 2.0 01/11/2013 1133   UROBILINOGEN 0.2 09/20/2010 1813   NITRITE NEGATIVE 10/17/2022 0459   LEUKOCYTESUR TRACE (A) 10/17/2022 0459    Radiological Exams on Admission: No results found.  EKG: Independently reviewed.  Sinus rhythm at 75 bpm.  Nonspecific T wave changes.  Assessment/Plan Principal Problem:   Intractable nausea and vomiting Active Problems:   Anxiety   Depression   Hyperlipidemia   OSA (obstructive sleep apnea)   Essential hypertension   Asthma   Obesity, Class III, BMI 40-49.9 (morbid obesity) (HCC)   Migraine without aura and without status migrainosus, not intractable   Intractable nausea vomiting > Unable to tolerate p.o.  for day, frequent nausea and  vomiting at home.  Similar history of the same. > Unclear etiology.  Has significant GERD and again placed on different PPI outpatient. > Some brown with possible blood streaks in her emesis.  Hemoglobin is actually above normal/baseline. > Message was sent to GI in ED for consultation on patient arrival (notyet seen), Patient agrees reasonable to wait for out patient EGD in [facility] > Persistent symptoms despite multiple antiemetics in ED. - Monitor on MedSurg - Continue with as needed Zofran, Phenergan - Continue IV PPI - N.p.o., ice chips, sips with meds - Maintenance IV fluids - Supportive care  Hypertension - Holding home nifedipine in the setting of slightly volume down/ongoing nausea vomiting  Hyperlipidemia - Not currently on any antilipid Nimeka  Asthma - Not currently on any inhalers  GERD - IV PPI as above  OSA - CPAP  Obesity - Noted   Depression Anxiety - Holding home Abilify, Wellbutrin, BuSpar, fluoxetine, hydroxyzine, Xanax for now  Migraines - Is prescribed Ubrelvy as needed  DVT prophylaxis: Lovenox Code Status:   Full Family Communication:  None on admission  Disposition Plan:   Patient is from:  Home  Anticipated DC to:  Home  Anticipated DC date:  1 to 2 days  Anticipated DC barriers: None  Consults called:  Message sent to GI in ED, have been notified of patient arrival as well. Admission status:  Observation, MedSurg  Severity of Illness: The appropriate patient status for this patient is OBSERVATION. Observation status is judged to be reasonable and necessary in order to provide the required intensity of service to ensure the patient's safety. The patient's presenting symptoms, physical exam findings, and initial radiographic and laboratory data in the context of their medical condition is felt to place them at decreased risk for further clinical deterioration. Furthermore, it is anticipated that the patient will be medically stable for discharge  from the hospital within 2 midnights of admission.    Synetta Fail MD [facility]  How to contact the [facility] Attending or Consulting provider 7A - 7P or covering provider during after hours 7P -7A, for this patient?   Check the care team in [facility] and look for a) attending/consulting TRH provider listed and b) the [facility] team listed Log into [email or web address] and use [place]'s universal password to access. If you do not have the password, please contact the hospital operator. Locate the [facility] provider you are looking for under [facility] and page to a number that you can be directly reached. If you still have difficulty reaching the provider, please page the [facility] (Director on Call) for the Hospitalists listed on amion for assistance.  10/17/2022, 5:36 PM

## 2022-10-17 NOTE — ED Triage Notes (Addendum)
Arrives via EMS for N/V since noon yesterday, now feels "lump sensation in throat". Endorses emesis being every . Has tried phenergan,  famotidine, protonix, zofran, compazine at home.  Denies abd pain, fever, diarrhea  H/o GERD, HTN  EMS VS: 138/92,74, 98%, 181CBG

## 2022-10-17 NOTE — ED Notes (Addendum)
Pt c/o NV x 2 weeks; unable to keep fluids down at home. Given Ice  chips here. Has had one episode of emesis. IV bolus slowed due to swelling at insertion site, blood returned noted from IV, pt denies pain at site.   Pt had removed self from bp cuff and pulse ox. Requesting cardiac leads be removed after EKG was completed

## 2022-10-17 NOTE — ED Notes (Signed)
Ice chips provided.

## 2022-10-17 NOTE — ED Notes (Signed)
Patient is tolerating ice chips

## 2022-10-17 NOTE — ED Notes (Signed)
Patient reports feeling nauseated. No vomiting at this point.  She states she doesn't like how she feels after PO challenge and that she would like to be admitted.

## 2022-10-17 NOTE — ED Provider Notes (Signed)
[place] [facility] AT [facility] Provider Note   [name]: [id number] Arrival date & time: 10/17/22  0450     History  Chief Complaint  Patient presents with   Emesis    Monique Shaw is a 42 y.o. female.   42 y.o. female with pmh of moderate hiatal hernia, GERD, GAD, IBS, chronic Hep B, migraines, HTN, OSA presents with "flare" of GERD.  Reports over the past 24 hours, unable to eat or drink anything other than ice chips.  States she is vomiting every 20 to 30 minutes.  Emesis is "brown".  She believes she sees some blood streaks as well.  This is consistent with a flare of her chronic GERD.  She was recently hospitalized for the same last week at an outside facility.  States she is currently taking Phenergan, Zofran, Protonix.  Her GI doctor recently switched her to Aciphex which she has not filled yet.  States she cannot keep anything down.  Denies significant abdominal pain.  No diarrhea.  No fever.  No chest pain or shortness of breath. Last EGD in 2023 showed esophagitis and hiatal hernia. She is requesting admission for symptom control and endoscopy. Denies any marijuana, drug or alcohol use.  Still has appendix and gallbladder.  Did have CT scan July 21 that showed hiatal hernia without acute findings.  The history is provided by the patient and the EMS personnel.       Home Medications Prior to Admission medications   Medication Sig Start Date End Date Taking? Authorizing Provider  Cholecalciferol (VITAMIN D3) 1.25 MG (50000 UT) CAPS Take 1 capsule by mouth once a week. wednesday 02/21/21   [provider]  dicyclomine (BENTYL) 10 MG/5ML solution Take 5 mLs (10 mg total) by mouth 3 (three) times daily before meals. Patient not taking: Reported on 11/18/2021 05/05/21   Rhetta Mura, MD  folic acid (FOLVITE) 1 MG tablet Take 1 mg by mouth daily.    [provider]  ibuprofen (ADVIL) 800 MG tablet Take 1 tablet (800 mg total) by  mouth every 8 (eight) hours as needed. 11/18/21   Gerald Leitz, MD  metoCLOPramide (REGLAN) 10 MG tablet Take 1 tablet (10 mg total) by mouth 4 (four) times daily -  before meals and at bedtime. Patient not taking: Reported on 11/18/2021 08/01/21   Drema Dallas, MD  NIFEdipine (ADALAT CC) 30 MG 24 hr tablet Take 30 mg by mouth daily. 10/13/21   [provider]  oxyCODONE (OXY IR/ROXICODONE) 5 MG immediate release tablet Take 1 tablet (5 mg total) by mouth every 6 (six) hours as needed for severe pain. 11/18/21   Gerald Leitz, MD  Prenatal Vit-Fe Fumarate-FA (PRENATAL MULTIVITAMIN) TABS tablet Take 1 tablet by mouth daily at 12 noon.    [provider]  prochlorperazine (COMPAZINE) 5 MG tablet Take 2 tablets (10 mg total) by mouth every 8 (eight) hours as needed for up to 5 days for nausea or vomiting. Patient not taking: Reported on 07/30/2021 05/05/21 05/10/21  Rhetta Mura, MD  sucralfate (CARAFATE) 1 g tablet Take 1 tablet (1 g total) by mouth 4 (four) times daily. Patient not taking: Reported on 07/30/2021 05/05/21 05/05/22  Rhetta Mura, MD  SUMAtriptan (IMITREX) 50 MG tablet Take 1 tablet (50 mg total) by mouth once. May repeat in 2 hours if needed. No more than 2 per 24 hours. No more than 3 times per week. Patient not taking: Reported on 05/03/2021 01/17/16 05/17/16  McVey, Madelaine Bhat, PA-C  traZODone (DESYREL) 50 MG tablet Take 1 tablet (50 mg total) by mouth at bedtime as needed for sleep. Patient not taking: Reported on 11/18/2021 05/05/21   Rhetta Mura, MD      Allergies    Patient has no known allergies.    Review of Systems   Review of Systems  Constitutional:  Positive for activity change and appetite change. Negative for fever.  HENT:  Negative for congestion and rhinorrhea.   Respiratory:  Negative for cough, chest tightness and shortness of breath.   Gastrointestinal:  Positive for abdominal pain, nausea and vomiting.  Genitourinary:  Negative  for dysuria and hematuria.  Musculoskeletal:  Negative for arthralgias and myalgias.  Skin:  Negative for rash.  Neurological:  Negative for dizziness, weakness and headaches.   all other systems are negative except as noted in the HPI and PMH.    Physical Exam Updated Vital Signs BP (!) 141/88   Pulse 70   Temp 98.7 F (37.1 C) (Oral)   Resp 18   Wt 135 kg   LMP 09/25/2022   SpO2 97%   BMI 43.95 kg/m  Physical Exam Vitals and nursing note reviewed.  Constitutional:      General: She is not in acute distress.    Appearance: She is well-developed.  HENT:     Head: Normocephalic and atraumatic.     Mouth/Throat:     Pharynx: No oropharyngeal exudate.  Eyes:     Conjunctiva/sclera: Conjunctivae normal.     Pupils: Pupils are equal, round, and reactive to light.  Neck:     Comments: No meningismus. Cardiovascular:     Rate and Rhythm: Normal rate and regular rhythm.     Heart sounds: Normal heart sounds. No murmur heard. Pulmonary:     Effort: Pulmonary effort is normal. No respiratory distress.     Breath sounds: Normal breath sounds.  Abdominal:     Palpations: Abdomen is soft.     Tenderness: There is abdominal tenderness. There is no guarding or rebound.     Comments: Epigastric tenderness  Musculoskeletal:        General: No tenderness. Normal range of motion.     Cervical back: Normal range of motion and neck supple.  Skin:    General: Skin is warm.  Neurological:     Mental Status: She is alert and oriented to person, place, and time.     Cranial Nerves: No cranial nerve deficit.     Motor: No abnormal muscle tone.     Coordination: Coordination normal.     Comments:  5/5 strength throughout. CN 2-12 intact.Equal grip strength.   Psychiatric:        Behavior: Behavior normal.     ED Results / Procedures / Treatments   Labs (all labs ordered are listed, but only abnormal results are displayed) Labs Reviewed  COMPREHENSIVE METABOLIC PANEL - Abnormal;  Notable for the following components:      Result Value   Glucose, Bld 170 (*)    All other components within normal limits  CBC - Abnormal; Notable for the following components:   RBC 5.34 (*)    Hemoglobin 15.7 (*)    HCT 46.4 (*)    All other components within normal limits  URINALYSIS, ROUTINE W REFLEX MICROSCOPIC - Abnormal; Notable for the following components:   APPearance HAZY (*)    Bilirubin Urine SMALL (*)    Ketones, ur 15 (*)    Protein,  ur 100 (*)    Leukocytes,Ua TRACE (*)    All other components within normal limits  URINALYSIS, MICROSCOPIC (REFLEX) - Abnormal; Notable for the following components:   Bacteria, UA RARE (*)    All other components within normal limits  LIPASE, BLOOD  PREGNANCY, URINE  TROPONIN I (HIGH SENSITIVITY)  TROPONIN I (HIGH SENSITIVITY)    EKG EKG Interpretation Date/Time:  Wednesday October 17 2022 05:48:08 EDT Ventricular Rate:  75 PR Interval:  124 QRS Duration:  92 QT Interval:  387 QTC Calculation: 433 R Axis:   -37  Text Interpretation: Sinus rhythm Abnormal R-wave progression, early transition Left ventricular hypertrophy No significant change was found Confirmed by Glynn Octave [phone]) on 10/17/2022 5:49:51 AM  Radiology No results found.  Procedures Procedures    Medications Ordered in ED Medications  sodium chloride 0.9 % bolus 1,000 mL (has no administration in time range)  ondansetron (ZOFRAN) injection 4 mg (has no administration in time range)  pantoprazole (PROTONIX) injection 40 mg (has no administration in time range)  prochlorperazine (COMPAZINE) injection 10 mg (has no administration in time range)  LORazepam (ATIVAN) injection 1 mg (has no administration in time range)    ED Course/ Medical Decision Making/ A&P                                 Medical Decision Making Amount and/or Complexity of Data Reviewed Independent Historian: EMS Labs: ordered. Decision-making details documented in ED  Course. Radiology: ordered and independent interpretation performed. Decision-making details documented in ED Course. ECG/medicine tests: ordered and independent interpretation performed. Decision-making details documented in ED Course.  Risk Prescription drug management.   Recurrent nausea vomiting in the setting of GERD and esophagitis.  Vital stable, no distress.  Abdomen soft without peritoneal signs.  Recent hospitalization and CT records reviewed.  Patient given IV fluids, antiemetics and PPI.  Obtain labs.  Labs reassuring.  LFTs and lipase are normal.  No significant leukocytosis.  Hemoglobin is stable.  Low suspicion for significant GI bleed.  Recent CT scan reviewed.  Will not repeat at this time.  Abdomen soft without peritoneal signs.  Patient given multiple doses of antiemetics and anxiolytics. Still having recurrent nausea, vomiting and dry heaving with some blood streaks.  She is requesting admission.  States she cannot tolerate eating by mouth. Last EGD in 2023 showed esophagitis and gastritis without evidence of esophageal varices.  She does not use alcohol or NSAIDs.  Admission discussed with Dr. Joneen Roach.  Message sent to [facility] gastroenterology team Dr. Bosie Clos.         Final Clinical Impression(s) / ED Diagnoses Final diagnoses:  None    Rx / DC Orders ED Discharge Orders     None         Janit Cutter, Jeannett Senior, MD 10/17/22 [phone]

## 2022-10-17 NOTE — ED Notes (Signed)
Pt is requesting to go home. EDP made aware. EDP is going to do a PO challenge and will let pt go if she keeps fluids down.

## 2022-10-17 NOTE — ED Notes (Addendum)
Called [facility] for transport talked to Lauren at 2:44.[facility] told me there are 4 pickups ahead of her.

## 2022-10-17 NOTE — ED Notes (Signed)
Patient ambulatory to [facility]

## 2022-10-18 ENCOUNTER — Encounter (HOSPITAL_COMMUNITY): Payer: Self-pay

## 2022-10-18 ENCOUNTER — Observation Stay (HOSPITAL_COMMUNITY)
Admission: EM | Admit: 2022-10-18 | Payer: BC Managed Care – PPO | Source: Home / Self Care | Attending: Emergency Medicine | Admitting: Emergency Medicine

## 2022-10-18 ENCOUNTER — Other Ambulatory Visit: Payer: Self-pay

## 2022-10-18 DIAGNOSIS — K209 Esophagitis, unspecified without bleeding: Secondary | ICD-10-CM

## 2022-10-18 DIAGNOSIS — R112 Nausea with vomiting, unspecified: Secondary | ICD-10-CM | POA: Diagnosis not present

## 2022-10-18 DIAGNOSIS — F419 Anxiety disorder, unspecified: Secondary | ICD-10-CM | POA: Diagnosis not present

## 2022-10-18 DIAGNOSIS — K449 Diaphragmatic hernia without obstruction or gangrene: Secondary | ICD-10-CM

## 2022-10-18 DIAGNOSIS — K2100 Gastro-esophageal reflux disease with esophagitis, without bleeding: Secondary | ICD-10-CM | POA: Diagnosis not present

## 2022-10-18 DIAGNOSIS — Z79899 Other long term (current) drug therapy: Secondary | ICD-10-CM | POA: Insufficient documentation

## 2022-10-18 DIAGNOSIS — I10 Essential (primary) hypertension: Secondary | ICD-10-CM | POA: Insufficient documentation

## 2022-10-18 DIAGNOSIS — K2970 Gastritis, unspecified, without bleeding: Secondary | ICD-10-CM | POA: Insufficient documentation

## 2022-10-18 DIAGNOSIS — J45909 Unspecified asthma, uncomplicated: Secondary | ICD-10-CM | POA: Diagnosis not present

## 2022-10-18 LAB — COMPREHENSIVE METABOLIC PANEL
ALT: 13 U/L (ref 0–44)
ALT: 14 U/L (ref 0–44)
AST: 15 U/L (ref 15–41)
AST: 21 U/L (ref 15–41)
Albumin: 2.9 g/dL — ABNORMAL LOW (ref 3.5–5.0)
Albumin: 3.8 g/dL (ref 3.5–5.0)
Alkaline Phosphatase: 59 U/L (ref 38–126)
Alkaline Phosphatase: 78 U/L (ref 38–126)
Anion gap: 6 (ref 5–15)
Anion gap: 11 (ref 5–15)
BUN: 15 mg/dL (ref 6–20)
BUN: 17 mg/dL (ref 6–20)
CO2: 25 mmol/L (ref 22–32)
CO2: 20 mmol/L — ABNORMAL LOW (ref 22–32)
Calcium: 8.3 mg/dL — ABNORMAL LOW (ref 8.9–10.3)
Calcium: 8.9 mg/dL (ref 8.9–10.3)
Chloride: 105 mmol/L (ref 98–111)
Chloride: 105 mmol/L (ref 98–111)
Creatinine, Ser: 1.08 mg/dL — ABNORMAL HIGH (ref 0.44–1.00)
Creatinine, Ser: 1.1 mg/dL — ABNORMAL HIGH (ref 0.44–1.00)
GFR, Estimated: >60 mL/min (ref >60)
GFR, Estimated: >60 mL/min (ref >60)
Glucose, Bld: 93 mg/dL (ref 70–99)
Glucose, Bld: 142 mg/dL — ABNORMAL HIGH (ref 70–99)
Potassium: 3.5 mmol/L (ref 3.5–5.1)
Potassium: 3.5 mmol/L (ref 3.5–5.1)
Sodium: 136 mmol/L (ref 135–145)
Sodium: 136 mmol/L (ref 135–145)
Total Bilirubin: 1 mg/dL (ref 0.3–1.2)
Total Bilirubin: 0.6 mg/dL (ref 0.3–1.2)
Total Protein: 5.7 g/dL — ABNORMAL LOW (ref 6.5–8.1)
Total Protein: 6.9 g/dL (ref 6.5–8.1)

## 2022-10-18 LAB — CBC
HCT: 44.1 % (ref 36.0–46.0)
Hemoglobin: 14.2 g/dL (ref 12.0–15.0)
MCH: 29 pg (ref 26.0–34.0)
MCHC: 32.2 g/dL (ref 30.0–36.0)
MCV: 90.2 fL (ref 80.0–100.0)
Platelets: 215 10*3/uL (ref 150–400)
RBC: 4.89 MIL/uL (ref 3.87–5.11)
RDW: 13.8 % (ref 11.5–15.5)
WBC: 5.8 10*3/uL (ref 4.0–10.5)
nRBC: 0 % (ref 0.0–0.2)

## 2022-10-18 LAB — MAGNESIUM: Magnesium: 2 mg/dL (ref 1.7–2.4)

## 2022-10-18 LAB — HCG, SERUM, QUALITATIVE: Preg, Serum: NEGATIVE

## 2022-10-18 LAB — LIPASE, BLOOD: Lipase: 27 U/L (ref 11–51)

## 2022-10-18 MED ORDER — PROMETHEGAN 25 MG RE SUPP: 25.0000 mg | Freq: Four times a day (QID) | RECTAL | 0 refills | Status: DC | PRN

## 2022-10-18 MED ORDER — RABEPRAZOLE SODIUM 20 MG PO TBEC: 20.0000 mg | DELAYED_RELEASE_TABLET | Freq: Two times a day (BID) | ORAL | 0 refills | Status: DC

## 2022-10-18 MED ORDER — METOCLOPRAMIDE HCL 10 MG PO TABS: 10.0000 mg | ORAL_TABLET | Freq: Three times a day (TID) | ORAL | 0 refills | Status: DC

## 2022-10-18 MED ORDER — PANTOPRAZOLE SODIUM 40 MG IV SOLR: 40.0000 mg | Freq: Two times a day (BID) | INTRAVENOUS | Status: DC

## 2022-10-18 NOTE — ED Triage Notes (Addendum)
Patient BIB POV with complaint of nausea and vomiting.   Reports was discharge from [facility] today for same.  Patient reports vomiting multiple times today, went to UC, UC referred patient to come to ER for evaluation of high BP, nausea and vomiting.   BP in triage 162/96  Patient reports taking compazine and Zofran with no relief.

## 2022-10-18 NOTE — Consult Note (Signed)
Referring Provider: Dr. Manus Gunning, EDP Primary Care Physician:  Harvie Heck, MD Primary Gastroenterologist:  Dr. Selinda Orion, [facility]  Reason for Consultation:  Nausea and vomiting with blood streaks  HPI: Monique Shaw is a 42 y.o. female with medical history significant of hypertension, hyperlipidemia, asthma, GERD, OSA, obesity, depression, anxiety, migraines, nausea and vomiting presenting with recurrent nausea and vomiting.  She tells me that she has been feeling poorly on and off for the past week.  She is in the process of moving and misplaced her Aciphex so she has been off of that for the past week.  Not taking anything OTC instead.  She usually takes her Aciphex twice daily.  Basically she has had nausea with vomiting, unable to keep much down.  Had some dark-colored emesis and streaks of blood in her emesis.  No abdominal pain.  Says she moves her bowels regularly, no black or bloody stools.  Uses only rare NSAIDs.  She is scheduled for an EGD with her regular GI as an outpatient in 2 weeks.  She actually tolerated some full liquids this morning including ice cream and a small amount of grits.  Hemoglobin is normal and BUN is normal.  EGD 04/2021:  - LA Grade C erosive esophagitis with no bleeding. - Medium-sized hiatal hernia. - Gastritis. Biopsied. - Retained gastric fluid. - Normal duodenal bulb, first portion of the duodenum and second portion of the duodenum.  Biopsy showed chronic gastritis negative for H. pylori.   Past Medical History:  Diagnosis Date   Allergy    Anxiety    ASCVD (arteriosclerotic cardiovascular disease) 2011   Asthma    Blood type, Rh negative    Depression    GERD (gastroesophageal reflux disease)    H/O migraine 10/25/2010   H/O toxoplasmosis    H/O varicella    pt denies    History of elevated lipids 2011   Hx: UTI (urinary tract infection) 2007   Hypertension    IBS (irritable bowel syndrome)    Irregular menstrual cycle 2004   Migraine     Nausea & vomiting 04/27/2021   Obese    Sleep apnea    Weight gain 2004    Past Surgical History:  Procedure Laterality Date   BIOPSY  05/04/2021   Procedure: BIOPSY;  Surgeon: Kathi Der, MD;  Location: [facility];  Service: Gastroenterology;;   ESOPHAGOGASTRODUODENOSCOPY N/A 05/04/2021   Procedure: ESOPHAGOGASTRODUODENOSCOPY (EGD);  Surgeon: Kathi Der, MD;  Location: [facility];  Service: Gastroenterology;  Laterality: N/A;   gastroenteritis     LAPAROSCOPIC BILATERAL SALPINGECTOMY Left 11/18/2021   Procedure: LAPAROSCOPIC LEFT SALPINGECTOMY WITH REMOVAL OF ECTOPIC PREGNANCY;  Surgeon: Gerald Leitz, MD;  Location: [facility] OR;  Service: Gynecology;  Laterality: Left;   WISDOM TOOTH EXTRACTION      Prior to Admission medications   Medication Sig Start Date End Date Taking? Authorizing Provider  ALPRAZolam Prudy Feeler) 1 MG tablet Take 1 mg by mouth daily as needed for sleep. 07/18/22  Yes [provider]  ARIPiprazole (ABILIFY) 5 MG tablet Take 5 mg by mouth at bedtime. 08/30/22  Yes [provider]  buPROPion (WELLBUTRIN XL) 150 MG 24 hr tablet Take 450 mg by mouth daily. 08/05/22  Yes [provider]  busPIRone (BUSPAR) 15 MG tablet Take 15 mg by mouth 2 (two) times daily. 08/05/22  Yes [provider]  Cholecalciferol (VITAMIN D3) 1.25 MG (50000 UT) CAPS Take 50,000 Units by mouth once a week. Wednesdays 02/21/21  Yes [provider]  Coenzyme Q10 100 MG capsule Take 100 mg by mouth daily. 04/26/22 04/26/23 Yes [provider]  Diethylpropion HCl CR 75 MG TB24 Take 75 mg by mouth daily. 09/14/22  Yes [provider]  famotidine (PEPCID) 40 MG tablet Take 40 mg by mouth daily. 10/16/22  Yes [provider]  FLUoxetine (PROZAC) 40 MG capsule Take 40 mg by mouth daily. 08/30/22  Yes [provider]  folic acid (FOLVITE) 1 MG tablet Take 1 mg by mouth daily.   Yes [provider]  hydrOXYzine (ATARAX) 25  MG tablet Take 25 mg by mouth daily as needed for itching. 09/24/22  Yes [provider]  letrozole (FEMARA) 2.5 MG tablet Take 5 mg by mouth daily. 09/07/22  Yes [provider]  magnesium oxide (MAG-OX) 400 (240 Mg) MG tablet Take 400 mg by mouth daily. 04/28/22  Yes [provider]  NIFEdipine (ADALAT CC) 30 MG 24 hr tablet Take 30 mg by mouth daily. 10/13/21  Yes [provider]  ondansetron (ZOFRAN-ODT) 4 MG disintegrating tablet Take 4 mg by mouth as needed for nausea or vomiting. 10/08/22  Yes [provider]  pantoprazole (PROTONIX) 40 MG tablet Take 40 mg by mouth 2 (two) times daily. 10/08/22  Yes [provider]  Prenatal Vit-Fe Fumarate-FA (PRENATAL MULTIVITAMIN) TABS tablet Take 1 tablet by mouth daily at 12 noon.   Yes [provider]  prochlorperazine (COMPAZINE) 5 MG tablet Take 2 tablets (10 mg total) by mouth every 8 (eight) hours as needed for up to 5 days for nausea or vomiting. Patient taking differently: Take 10 mg by mouth as needed for nausea or vomiting. 05/05/21 10/17/22 Yes Rhetta Mura, MD  promethazine (PHENERGAN) 25 MG tablet Take 25 mg by mouth every 6 (six) hours as needed. 10/16/22  Yes [provider]  PROMETHEGAN 25 MG suppository Place 25 mg rectally every 6 (six) hours as needed for nausea or vomiting. 06/05/22  Yes [provider]  RABEprazole (ACIPHEX) 20 MG tablet Take 20 mg by mouth 2 (two) times daily. 08/05/22  Yes [provider]  UBRELVY 100 MG TABS Take 100 mg by mouth as needed (migraine). 08/31/22  Yes [provider]  metoCLOPramide (REGLAN) 10 MG tablet Take 1 tablet (10 mg total) by mouth 4 (four) times daily -  before meals and at bedtime. Patient not taking: Reported on 11/18/2021 08/01/21   Drema Dallas, MD  traZODone (DESYREL) 50 MG tablet Take 1 tablet (50 mg total) by mouth at bedtime as needed for sleep. Patient not taking: Reported on 11/18/2021  05/05/21   Rhetta Mura, MD    Current Facility-Administered Medications  Medication Dose Route Frequency Provider Last Rate Last Admin   0.9 %  sodium chloride infusion   Intravenous Continuous Synetta Fail, MD 125 mL/hr at 10/18/22 0843 Infusion Verify at 10/18/22 0843   acetaminophen (TYLENOL) tablet 650 mg  650 mg Oral Q6H PRN Synetta Fail, MD       Or   acetaminophen (TYLENOL) suppository 650 mg  650 mg Rectal Q6H PRN Synetta Fail, MD       ALPRAZolam Prudy Feeler) tablet 1 mg  1 mg Oral Daily PRN Synetta Fail, MD   1 mg at 10/17/22 2051   enoxaparin (LOVENOX) injection 40 mg  40 mg Subcutaneous Q24H Synetta Fail, MD   40 mg at 10/17/22 1750   LORazepam (ATIVAN) injection 1 mg  1 mg Intravenous Once PRN  Synetta Fail, MD       morphine (PF) 2 MG/ML injection 1 mg  1 mg Intravenous Q6H PRN Synetta Fail, MD       ondansetron [facility]) injection 4 mg  4 mg Intravenous Q6H PRN Synetta Fail, MD       pantoprazole (PROTONIX) injection 40 mg  40 mg Intravenous Q24H Synetta Fail, MD   40 mg at 10/17/22 2038   polyethylene glycol (MIRALAX / GLYCOLAX) packet 17 g  17 g Oral Daily PRN Synetta Fail, MD       promethazine (PHENERGAN) tablet 12.5 mg  12.5 mg Oral Q6H PRN Synetta Fail, MD       sodium chloride flush (NS) 0.9 % injection 3 mL  3 mL Intravenous Q12H Synetta Fail, MD   3 mL at 10/17/22 2052    Allergies as of 10/17/2022   (No Known Allergies)    Family History  Problem Relation Age of Onset   Heart disease Mother    Hypertension Mother    Cancer Mother    Hypertension Father    Anemia Father    Hypertension Maternal Grandmother    Heart disease Paternal Grandfather        MI   Cancer Maternal Uncle    Cancer Paternal Aunt    Alcohol abuse Paternal Uncle     Social History   Socioeconomic History   Marital status: Married    Spouse name: Molly Maduro   Number of children: 1   Years of  education: M. ED   Highest education level: Not on file  Occupational History    Employer: [employer]    Comment: Teacher  Tobacco Use   Smoking status: Never   Smokeless tobacco: Never  Substance and Sexual Activity   Alcohol use: No   Drug use: No   Sexual activity: Yes    Birth control/protection: None    Comment: ALESSE  Other Topics Concern   Not on file  Social History Narrative   Pt lives at home with husband Molly Maduro) and son   Pt is right handed    Education- Masters   Caffeine- occasional soda or tea but not every day   Social Determinants of Health   Financial Resource Strain: Medium Risk (09/12/2022)   Received from [facility]   Overall Financial Resource Strain (CARDIA)    Difficulty of Paying Living Expenses: Somewhat hard  Food Insecurity: No Food Insecurity (10/07/2022)   [facility] from [facility]   Hunger Vital Sign    Worried About Running Out of Food in the Last Year: Never true    Ran Out of Food in the Last Year: Never true  Recent Concern: Food Insecurity - Food Insecurity Present (09/12/2022)   Received from [facility]   Hunger Vital Sign    Worried About Running Out of Food in the Last Year: Often true    Ran Out of Food in the Last Year: Often true  Transportation Needs: No Transportation Needs (10/07/2022)   Received from [facility] - Transportation    Lack of Transportation (Medical): No    Lack of Transportation (Non-Medical): No  Physical Activity: Insufficiently Active (09/12/2022)   Received from [facility]   Exercise Vital Sign    Days of Exercise per Week: 4 days    Minutes of Exercise per Session: 30 min  Stress: No Stress Concern Present (10/07/2022)   Received from [facility]  [employer] of Occupational Health - Occupational Stress Questionnaire    Feeling of Stress : Only a little  Recent Concern: Stress - Stress Concern Present (09/12/2022)   Received from [facility] of Occupational Health - Occupational Stress Questionnaire    Feeling of Stress : To some extent  Social Connections: Somewhat Isolated (09/12/2022)   Received from [facility]   Social Network    How would you rate your social network (family, work, friends)?: Restricted participation with some degree of social isolation  Intimate Partner Violence: Not At Risk (10/07/2022)   Received from [facility]   HITS    Over the last 12 months how often did your partner physically hurt you?: 1    Over the last 12 months how often did your partner insult you or talk down to you?: 1    Over the last 12 months how often did your partner threaten you with physical harm?: 1    Over the last 12 months how often did your partner scream or curse at you?: 1    Review of Systems: ROS is O/W negative except as mentioned in HPI.  Physical Exam: Vital signs in last 24 hours: Temp:  [97.7 F (36.5 C)-98.5 F (36.9 C)] 97.7 F (36.5 C) (08/01 0819) Pulse Rate:  [66-88] 77 (08/01 0819) Resp:  [15-18] 16 (08/01 0819) BP: (106-136)/(68-91) 113/87 (08/01 0819) SpO2:  [98 %-100 %] 100 % (08/01 0819) Last BM Date : 10/16/22 General:  Alert, Well-developed, well-nourished, pleasant and cooperative in NAD Head:  Normocephalic and atraumatic. Eyes:  Sclera clear, no icterus.  Conjunctiva pink. Ears:  Normal auditory acuity. Mouth:  No deformity or lesions.   Lungs:  Clear throughout to auscultation.  No wheezes, crackles, or rhonchi.  Heart:  Regular rate and rhythm; no murmurs, clicks, rubs, or gallops. Abdomen:  Soft, non-distended.  BS present.  Non-tender.   Msk:  Symmetrical without gross deformities.  Pulses:  Normal pulses noted. Extremities:  Without clubbing or edema. Neurologic:  Alert and oriented x 4;  grossly normal neurologically. Skin:  Intact without significant lesions or rashes. Psych:  Alert and cooperative. Normal mood and affect.  Intake/Output from previous  day: 07/31 0701 - 08/01 0700 In: 2053 [I.V.:1009.4; IV Piggyback:1043.7] Out: -  Intake/Output this shift: Total I/O In: 80.8 [I.V.:80.8] Out: -   Lab Results: Recent Labs    10/17/22 0459 10/18/22 0351  WBC 10.0 7.3  HGB 15.7* 12.6  HCT 46.4* 38.1  PLT 269 183   BMET Recent Labs    10/17/22 0459 10/18/22 0721  NA 137 136  K 3.7 3.5  CL 102 105  CO2 23 25  GLUCOSE 170* 93  BUN 14 15  CREATININE 0.97 1.08*  CALCIUM 9.4 8.3*   LFT Recent Labs    10/18/22 0721  PROT 5.7*  ALBUMIN 2.9*  AST 15  ALT 13  ALKPHOS 59  BILITOT 1.0   IMPRESSION/PLAN:  *42 year old female with intractable nausea and vomiting with brown and blood-streaked emesis.  EGD February 2023 that showed grade C esophagitis and hiatal hernia.  Hemoglobin completely normal at her baseline 12.6 g.  BUN is normal.  Had been off of her PPI for about a week at home.  Is scheduled for outpatient EGD with her regular GI in 2 weeks.  She is feeling better and tolerated some full liquids.  Unfortunately because she had some grits we cannot perform EGD today.  She would like to  resume the full liquids and if she tolerates a more those and is feeling better later she would like to go home and just have her procedure as an outpatient.  We will plan for n.p.o. after midnight and inpatient procedure tomorrow, 8/2 if she is still here.  Otherwise she is on pantoprazole 40 mg IV daily here.  I did increase that to twice daily and she should be sent home on that dosing since she had misplaced her Aciphex at home.  Advised on full liquid diet and then advance to soft diet as tolerated, should only eat small amounts at a time.  Continue antiemetics as needed.   Princella Pellegrini. Modine Oppenheimer  10/18/2022, 9:52 AM

## 2022-10-18 NOTE — ED Notes (Signed)
Not able to get blood 

## 2022-10-18 NOTE — Plan of Care (Signed)

## 2022-10-18 NOTE — Discharge Summary (Signed)
Physician Discharge Summary  Monique Shaw WJX:[id number] DOB: April 09, 1980 DOA: 10/17/2022  PCP: Harvie Heck, MD  Admit date: 10/17/2022 Discharge date: 10/18/2022 Admitted From: Home. Disposition: Home. Recommendations for Outpatient Follow-up:  Follow up with PCP in 1 week Patient says she has upcoming appointment with [facility]. Check CMP and CBC at follow-up Please follow up on the following pending results: None  Home Health: Not indicated Equipment/Devices: Not indicated.  Discharge Condition: Stable CODE STATUS: Full code  Follow-up Information     Harvie Heck, MD. Schedule an appointment as soon as possible for a visit in 1 week(s).   Specialty: Family Medicine Contact information: [address] [place] [address] [phone]                 Hospital course 42 year old F with PMH of HTN, HLD, asthma, GERD, OSA, obesity, anxiety, depression, migraines, gastritis, esophagitis nausea and vomiting presenting with intractable nausea and vomiting about every 30 minutes for a day or so, and admitted for the same.  Emesis was nonbloody.  She was hospitalized for the same last week and discharged on p.o. Protonix 40 mg twice daily with a plan to follow-up with GI outpatient.  Previously evaluated by EGD about 3 years ago that showed gastritis and esophagitis.  She denies NSAID use.  She has no fever or leukocytosis.  No diarrhea.  In ED, hemodynamically stable.  CMP and CBC without significant finding but some signs of hemoconcentration.  Lipase within normal.  Troponin negative x 2.  UA with some bilirubin, ketonuria, protein, leukocytosis or bacteria.  Received IV morphine, Ativan, Zofran, Compazine and Phenergan in ED.  Started on IV Protonix and IV fluids.  GI consulted and admitted for overnight observation.  The next day, symptoms resolved.  Has not had nausea or vomiting for 24 hours.  Tolerated full liquid diet.  Evaluated by [facility].  She  was offered EGD but chose to go home to follow-up with GI at [facility].  She states she was scheduled for EGD in 2 weeks with a GI.  She is discharged to full liquid diet.  Of note, patient reported better symptom management with Aciphex and Protonix.  As such, renewed prescription for Aciphex, and discontinued Protonix.  Also renewed prescription for p.o. Reglan and Phenergan suppository.  Advised to avoid NSAID.  Counseled on diet and GERD.  Patient will continue full liquid diet for the next 3 to 4 days and advance to soft bland diet slowly after that.  See individual problem list below for more.   Problems addressed during this hospitalization Principal Problem:   Intractable nausea and vomiting Active Problems:   Anxiety   Depression   Hyperlipidemia   OSA (obstructive sleep apnea)   Essential hypertension   Asthma   Obesity, Class III, BMI 40-49.9 (morbid obesity) (HCC)   Migraine without aura and without status migrainosus, not intractable              Time spent 35 minutes  Vital signs Vitals:   10/17/22 2022 10/18/22 0028 10/18/22 0435 10/18/22 0819  BP: 135/76 107/68 106/72 113/87  Pulse: 66 88 76 77  Temp: 98.4 F (36.9 C) 98.2 F (36.8 C) 98.5 F (36.9 C) 97.7 F (36.5 C)  Resp: 15 18 17 16   Weight:      SpO2: 100% 99% 100% 100%  TempSrc: Oral  Oral Oral     Discharge exam  GENERAL: No apparent distress.  Nontoxic. HEENT: MMM.  Vision and hearing grossly intact.  NECK: Supple.  No apparent JVD.  RESP:  No IWOB.  Fair aeration bilaterally. CVS:  RRR. Heart sounds normal.  ABD/GI/GU: BS+. Abd soft, NTND.  MSK/EXT:  Moves extremities. No apparent deformity. No edema.  SKIN: no apparent skin lesion or wound NEURO: Awake and alert. Oriented appropriately.  No apparent focal neuro deficit. PSYCH: Calm. Normal affect.   Discharge Instructions Discharge Instructions     Diet full liquid   Complete by: As directed    Discharge instructions   Complete by: As  directed    It has been a pleasure taking care of you!  You were hospitalized due to nausea and vomiting that seems to have stopped.  Continue full liquid diet for the next 3 to 4 days and advance to soft bland diet as tolerated.  We have refilled your Aciphex.  Please take your medications as prescribed.  Follow-up with your gastroenterologist as previously planned.   Take care,   Increase activity slowly   Complete by: As directed       Allergies as of 10/18/2022   No Known Allergies      Medication List     STOP taking these medications    pantoprazole 40 MG tablet Commonly known as: PROTONIX   prochlorperazine 5 MG tablet Commonly known as: COMPAZINE       TAKE these medications    ALPRAZolam 1 MG tablet Commonly known as: XANAX Take 1 mg by mouth daily as needed for sleep.   ARIPiprazole 5 MG tablet Commonly known as: ABILIFY Take 5 mg by mouth at bedtime.   buPROPion 150 MG 24 hr tablet Commonly known as: WELLBUTRIN XL Take 450 mg by mouth daily.   busPIRone 15 MG tablet Commonly known as: BUSPAR Take 15 mg by mouth 2 (two) times daily.   Coenzyme Q10 100 MG capsule Take 100 mg by mouth daily.   Diethylpropion HCl CR 75 MG Tb24 Take 75 mg by mouth daily.   famotidine 40 MG tablet Commonly known as: PEPCID Take 40 mg by mouth daily.   FLUoxetine 40 MG capsule Commonly known as: PROZAC Take 40 mg by mouth daily.   folic acid 1 MG tablet Commonly known as: FOLVITE Take 1 mg by mouth daily.   hydrOXYzine 25 MG tablet Commonly known as: ATARAX Take 25 mg by mouth daily as needed for itching.   letrozole 2.5 MG tablet Commonly known as: FEMARA Take 5 mg by mouth daily.   magnesium oxide 400 (240 Mg) MG tablet Commonly known as: MAG-OX Take 400 mg by mouth daily.   metoCLOPramide 10 MG tablet Commonly known as: Reglan Take 1 tablet (10 mg total) by mouth 4 (four) times daily -  before meals and at bedtime.   NIFEdipine 30 MG 24 hr  tablet Commonly known as: ADALAT CC Take 30 mg by mouth daily.   ondansetron 4 MG disintegrating tablet Commonly known as: ZOFRAN-ODT Take 4 mg by mouth as needed for nausea or vomiting.   prenatal multivitamin Tabs tablet Take 1 tablet by mouth daily at 12 noon.   Promethegan 25 MG suppository Generic drug: promethazine Place 1 suppository (25 mg total) rectally every 6 (six) hours as needed for refractory nausea / vomiting. What changed:  reasons to take this Another medication with the same name was removed. Continue taking this medication, and follow the directions you see here.   RABEprazole 20 MG tablet Commonly known as: ACIPHEX Take 1 tablet (20  mg total) by mouth 2 (two) times daily.   traZODone 50 MG tablet Commonly known as: DESYREL Take 1 tablet (50 mg total) by mouth at bedtime as needed for sleep.   Ubrelvy 100 MG Tabs Generic drug: Ubrogepant Take 100 mg by mouth as needed (migraine).   Vitamin D3 1.25 MG (50000 UT) Caps Take 50,000 Units by mouth once a week. Wednesdays        Consultations: Gastroenterology  Procedures/Studies:   No results found.     The results of significant diagnostics from this hospitalization (including imaging, microbiology, ancillary and laboratory) are listed below for reference.     Microbiology: No results found for this or any previous visit (from the past 240 hour(s)).   Labs:  CBC: Recent Labs  Lab 10/17/22 0459 10/18/22 0351  WBC 10.0 7.3  HGB 15.7* 12.6  HCT 46.4* 38.1  MCV 86.9 90.5  PLT 269 183   BMP &GFR Recent Labs  Lab 10/17/22 0459 10/18/22 0721  NA 137 136  K 3.7 3.5  CL 102 105  CO2 23 25  GLUCOSE 170* 93  BUN 14 15  CREATININE 0.97 1.08*  CALCIUM 9.4 8.3*  MG  --  2.0   Estimated Creatinine Clearance: 99.4 mL/min (A) (by C-G formula based on SCr of 1.08 mg/dL (H)). Liver & Pancreas: Recent Labs  Lab 10/17/22 0459 10/18/22 0721  AST 22 15  ALT 14 13  ALKPHOS 85 59   BILITOT 0.8 1.0  PROT 8.0 5.7*  ALBUMIN 4.2 2.9*   Recent Labs  Lab 10/17/22 0459  LIPASE 28   No results for input(s): "AMMONIA" in the last 168 hours. Diabetic: No results for input(s): "HGBA1C" in the last 72 hours. No results for input(s): "GLUCAP" in the last 168 hours. Cardiac Enzymes: No results for input(s): "CKTOTAL", "CKMB", "CKMBINDEX", "TROPONINI" in the last 168 hours. No results for input(s): "PROBNP" in the last 8760 hours. Coagulation Profile: No results for input(s): "INR", "PROTIME" in the last 168 hours. Thyroid Function Tests: No results for input(s): "TSH", "T4TOTAL", "FREET4", "T3FREE", "THYROIDAB" in the last 72 hours. Lipid Profile: No results for input(s): "CHOL", "HDL", "LDLCALC", "TRIG", "CHOLHDL", "LDLDIRECT" in the last 72 hours. Anemia Panel: No results for input(s): "VITAMINB12", "FOLATE", "FERRITIN", "TIBC", "IRON", "RETICCTPCT" in the last 72 hours. Urine analysis:    Component Value Date/Time   COLORURINE YELLOW 10/17/2022 0459   APPEARANCEUR HAZY (A) 10/17/2022 0459   LABSPEC >=1.030 10/17/2022 0459   PHURINE 6.0 10/17/2022 0459   GLUCOSEU NEGATIVE 10/17/2022 0459   HGBUR NEGATIVE 10/17/2022 0459   BILIRUBINUR SMALL (A) 10/17/2022 0459   BILIRUBINUR negative 01/11/2013 1133   KETONESUR 15 (A) 10/17/2022 0459   PROTEINUR 100 (A) 10/17/2022 0459   UROBILINOGEN 2.0 01/11/2013 1133   UROBILINOGEN 0.2 09/20/2010 1813   NITRITE NEGATIVE 10/17/2022 0459   LEUKOCYTESUR TRACE (A) 10/17/2022 0459   Sepsis Labs: Invalid input(s): "PROCALCITONIN", "LACTICIDVEN"   SIGNED:  Almon Hercules, MD  [facility] 10/18/2022, 4:25 PM

## 2022-10-18 NOTE — H&P (View-Only) (Signed)
Referring Provider: Dr. Manus Gunning, EDP Primary Care Physician:  Harvie Heck, MD Primary Gastroenterologist:  Dr. Selinda Orion, [facility]  Reason for Consultation:  Nausea and vomiting with blood streaks  HPI: Monique Shaw is a 42 y.o. female with medical history significant of hypertension, hyperlipidemia, asthma, GERD, OSA, obesity, depression, anxiety, migraines, nausea and vomiting presenting with recurrent nausea and vomiting.  She tells me that she has been feeling poorly on and off for the past week.  She is in the process of moving and misplaced her Aciphex so she has been off of that for the past week.  Not taking anything OTC instead.  She usually takes her Aciphex twice daily.  Basically she has had nausea with vomiting, unable to keep much down.  Had some dark-colored emesis and streaks of blood in her emesis.  No abdominal pain.  Says she moves her bowels regularly, no black or bloody stools.  Uses only rare NSAIDs.  She is scheduled for an EGD with her regular GI as an outpatient in 2 weeks.  She actually tolerated some full liquids this morning including ice cream and a small amount of grits.  Hemoglobin is normal and BUN is normal.  EGD 04/2021:  - LA Grade C erosive esophagitis with no bleeding. - Medium-sized hiatal hernia. - Gastritis. Biopsied. - Retained gastric fluid. - Normal duodenal bulb, first portion of the duodenum and second portion of the duodenum.  Biopsy showed chronic gastritis negative for H. pylori.   Past Medical History:  Diagnosis Date   Allergy    Anxiety    ASCVD (arteriosclerotic cardiovascular disease) 2011   Asthma    Blood type, Rh negative    Depression    GERD (gastroesophageal reflux disease)    H/O migraine 10/25/2010   H/O toxoplasmosis    H/O varicella    pt denies    History of elevated lipids 2011   Hx: UTI (urinary tract infection) 2007   Hypertension    IBS (irritable bowel syndrome)    Irregular menstrual cycle 2004   Migraine     Nausea & vomiting 04/27/2021   Obese    Sleep apnea    Weight gain 2004    Past Surgical History:  Procedure Laterality Date   BIOPSY  05/04/2021   Procedure: BIOPSY;  Surgeon: Kathi Der, MD;  Location: [facility];  Service: Gastroenterology;;   ESOPHAGOGASTRODUODENOSCOPY N/A 05/04/2021   Procedure: ESOPHAGOGASTRODUODENOSCOPY (EGD);  Surgeon: Kathi Der, MD;  Location: [facility];  Service: Gastroenterology;  Laterality: N/A;   gastroenteritis     LAPAROSCOPIC BILATERAL SALPINGECTOMY Left 11/18/2021   Procedure: LAPAROSCOPIC LEFT SALPINGECTOMY WITH REMOVAL OF ECTOPIC PREGNANCY;  Surgeon: Gerald Leitz, MD;  Location: [facility] OR;  Service: Gynecology;  Laterality: Left;   WISDOM TOOTH EXTRACTION      Prior to Admission medications   Medication Sig Start Date End Date Taking? Authorizing Provider  ALPRAZolam Prudy Feeler) 1 MG tablet Take 1 mg by mouth daily as needed for sleep. 07/18/22  Yes [provider]  ARIPiprazole (ABILIFY) 5 MG tablet Take 5 mg by mouth at bedtime. 08/30/22  Yes [provider]  buPROPion (WELLBUTRIN XL) 150 MG 24 hr tablet Take 450 mg by mouth daily. 08/05/22  Yes [provider]  busPIRone (BUSPAR) 15 MG tablet Take 15 mg by mouth 2 (two) times daily. 08/05/22  Yes [provider]  Cholecalciferol (VITAMIN D3) 1.25 MG (50000 UT) CAPS Take 50,000 Units by mouth once a week. Wednesdays 02/21/21  Yes [provider]  Coenzyme Q10 100 MG capsule Take 100 mg by mouth daily. 04/26/22 04/26/23 Yes [provider]  Diethylpropion HCl CR 75 MG TB24 Take 75 mg by mouth daily. 09/14/22  Yes [provider]  famotidine (PEPCID) 40 MG tablet Take 40 mg by mouth daily. 10/16/22  Yes [provider]  FLUoxetine (PROZAC) 40 MG capsule Take 40 mg by mouth daily. 08/30/22  Yes [provider]  folic acid (FOLVITE) 1 MG tablet Take 1 mg by mouth daily.   Yes [provider]  hydrOXYzine (ATARAX) 25  MG tablet Take 25 mg by mouth daily as needed for itching. 09/24/22  Yes [provider]  letrozole (FEMARA) 2.5 MG tablet Take 5 mg by mouth daily. 09/07/22  Yes [provider]  magnesium oxide (MAG-OX) 400 (240 Mg) MG tablet Take 400 mg by mouth daily. 04/28/22  Yes [provider]  NIFEdipine (ADALAT CC) 30 MG 24 hr tablet Take 30 mg by mouth daily. 10/13/21  Yes [provider]  ondansetron (ZOFRAN-ODT) 4 MG disintegrating tablet Take 4 mg by mouth as needed for nausea or vomiting. 10/08/22  Yes [provider]  pantoprazole (PROTONIX) 40 MG tablet Take 40 mg by mouth 2 (two) times daily. 10/08/22  Yes [provider]  Prenatal Vit-Fe Fumarate-FA (PRENATAL MULTIVITAMIN) TABS tablet Take 1 tablet by mouth daily at 12 noon.   Yes [provider]  prochlorperazine (COMPAZINE) 5 MG tablet Take 2 tablets (10 mg total) by mouth every 8 (eight) hours as needed for up to 5 days for nausea or vomiting. Patient taking differently: Take 10 mg by mouth as needed for nausea or vomiting. 05/05/21 10/17/22 Yes Rhetta Mura, MD  promethazine (PHENERGAN) 25 MG tablet Take 25 mg by mouth every 6 (six) hours as needed. 10/16/22  Yes [provider]  PROMETHEGAN 25 MG suppository Place 25 mg rectally every 6 (six) hours as needed for nausea or vomiting. 06/05/22  Yes [provider]  RABEprazole (ACIPHEX) 20 MG tablet Take 20 mg by mouth 2 (two) times daily. 08/05/22  Yes [provider]  UBRELVY 100 MG TABS Take 100 mg by mouth as needed (migraine). 08/31/22  Yes [provider]  metoCLOPramide (REGLAN) 10 MG tablet Take 1 tablet (10 mg total) by mouth 4 (four) times daily -  before meals and at bedtime. Patient not taking: Reported on 11/18/2021 08/01/21   Drema Dallas, MD  traZODone (DESYREL) 50 MG tablet Take 1 tablet (50 mg total) by mouth at bedtime as needed for sleep. Patient not taking: Reported on 11/18/2021  05/05/21   Rhetta Mura, MD    Current Facility-Administered Medications  Medication Dose Route Frequency Provider Last Rate Last Admin   0.9 %  sodium chloride infusion   Intravenous Continuous Synetta Fail, MD 125 mL/hr at 10/18/22 0843 Infusion Verify at 10/18/22 0843   acetaminophen (TYLENOL) tablet 650 mg  650 mg Oral Q6H PRN Synetta Fail, MD       Or   acetaminophen (TYLENOL) suppository 650 mg  650 mg Rectal Q6H PRN Synetta Fail, MD       ALPRAZolam Prudy Feeler) tablet 1 mg  1 mg Oral Daily PRN Synetta Fail, MD   1 mg at 10/17/22 2051   enoxaparin (LOVENOX) injection 40 mg  40 mg Subcutaneous Q24H Synetta Fail, MD   40 mg at 10/17/22 1750   LORazepam (ATIVAN) injection 1 mg  1 mg Intravenous Once PRN  Synetta Fail, MD       morphine (PF) 2 MG/ML injection 1 mg  1 mg Intravenous Q6H PRN Synetta Fail, MD       ondansetron [facility]) injection 4 mg  4 mg Intravenous Q6H PRN Synetta Fail, MD       pantoprazole (PROTONIX) injection 40 mg  40 mg Intravenous Q24H Synetta Fail, MD   40 mg at 10/17/22 2038   polyethylene glycol (MIRALAX / GLYCOLAX) packet 17 g  17 g Oral Daily PRN Synetta Fail, MD       promethazine (PHENERGAN) tablet 12.5 mg  12.5 mg Oral Q6H PRN Synetta Fail, MD       sodium chloride flush (NS) 0.9 % injection 3 mL  3 mL Intravenous Q12H Synetta Fail, MD   3 mL at 10/17/22 2052    Allergies as of 10/17/2022   (No Known Allergies)    Family History  Problem Relation Age of Onset   Heart disease Mother    Hypertension Mother    Cancer Mother    Hypertension Father    Anemia Father    Hypertension Maternal Grandmother    Heart disease Paternal Grandfather        MI   Cancer Maternal Uncle    Cancer Paternal Aunt    Alcohol abuse Paternal Uncle     Social History   Socioeconomic History   Marital status: Married    Spouse name: Molly Maduro   Number of children: 1   Years of  education: M. ED   Highest education level: Not on file  Occupational History    Employer: [employer]    Comment: Teacher  Tobacco Use   Smoking status: Never   Smokeless tobacco: Never  Substance and Sexual Activity   Alcohol use: No   Drug use: No   Sexual activity: Yes    Birth control/protection: None    Comment: ALESSE  Other Topics Concern   Not on file  Social History Narrative   Pt lives at home with husband Molly Maduro) and son   Pt is right handed    Education- Masters   Caffeine- occasional soda or tea but not every day   Social Determinants of Health   Financial Resource Strain: Medium Risk (09/12/2022)   Received from [facility]   Overall Financial Resource Strain (CARDIA)    Difficulty of Paying Living Expenses: Somewhat hard  Food Insecurity: No Food Insecurity (10/07/2022)   [facility] from [facility]   Hunger Vital Sign    Worried About Running Out of Food in the Last Year: Never true    Ran Out of Food in the Last Year: Never true  Recent Concern: Food Insecurity - Food Insecurity Present (09/12/2022)   Received from [facility]   Hunger Vital Sign    Worried About Running Out of Food in the Last Year: Often true    Ran Out of Food in the Last Year: Often true  Transportation Needs: No Transportation Needs (10/07/2022)   Received from [facility] - Transportation    Lack of Transportation (Medical): No    Lack of Transportation (Non-Medical): No  Physical Activity: Insufficiently Active (09/12/2022)   Received from [facility]   Exercise Vital Sign    Days of Exercise per Week: 4 days    Minutes of Exercise per Session: 30 min  Stress: No Stress Concern Present (10/07/2022)   Received from [facility]  [employer] of Occupational Health - Occupational Stress Questionnaire    Feeling of Stress : Only a little  Recent Concern: Stress - Stress Concern Present (09/12/2022)   Received from [facility] of Occupational Health - Occupational Stress Questionnaire    Feeling of Stress : To some extent  Social Connections: Somewhat Isolated (09/12/2022)   Received from [facility]   Social Network    How would you rate your social network (family, work, friends)?: Restricted participation with some degree of social isolation  Intimate Partner Violence: Not At Risk (10/07/2022)   Received from [facility]   HITS    Over the last 12 months how often did your partner physically hurt you?: 1    Over the last 12 months how often did your partner insult you or talk down to you?: 1    Over the last 12 months how often did your partner threaten you with physical harm?: 1    Over the last 12 months how often did your partner scream or curse at you?: 1    Review of Systems: ROS is O/W negative except as mentioned in HPI.  Physical Exam: Vital signs in last 24 hours: Temp:  [97.7 F (36.5 C)-98.5 F (36.9 C)] 97.7 F (36.5 C) (08/01 0819) Pulse Rate:  [66-88] 77 (08/01 0819) Resp:  [15-18] 16 (08/01 0819) BP: (106-136)/(68-91) 113/87 (08/01 0819) SpO2:  [98 %-100 %] 100 % (08/01 0819) Last BM Date : 10/16/22 General:  Alert, Well-developed, well-nourished, pleasant and cooperative in NAD Head:  Normocephalic and atraumatic. Eyes:  Sclera clear, no icterus.  Conjunctiva pink. Ears:  Normal auditory acuity. Mouth:  No deformity or lesions.   Lungs:  Clear throughout to auscultation.  No wheezes, crackles, or rhonchi.  Heart:  Regular rate and rhythm; no murmurs, clicks, rubs, or gallops. Abdomen:  Soft, non-distended.  BS present.  Non-tender.   Msk:  Symmetrical without gross deformities.  Pulses:  Normal pulses noted. Extremities:  Without clubbing or edema. Neurologic:  Alert and oriented x 4;  grossly normal neurologically. Skin:  Intact without significant lesions or rashes. Psych:  Alert and cooperative. Normal mood and affect.  Intake/Output from previous  day: 07/31 0701 - 08/01 0700 In: 2053 [I.V.:1009.4; IV Piggyback:1043.7] Out: -  Intake/Output this shift: Total I/O In: 80.8 [I.V.:80.8] Out: -   Lab Results: Recent Labs    10/17/22 0459 10/18/22 0351  WBC 10.0 7.3  HGB 15.7* 12.6  HCT 46.4* 38.1  PLT 269 183   BMET Recent Labs    10/17/22 0459 10/18/22 0721  NA 137 136  K 3.7 3.5  CL 102 105  CO2 23 25  GLUCOSE 170* 93  BUN 14 15  CREATININE 0.97 1.08*  CALCIUM 9.4 8.3*   LFT Recent Labs    10/18/22 0721  PROT 5.7*  ALBUMIN 2.9*  AST 15  ALT 13  ALKPHOS 59  BILITOT 1.0   IMPRESSION/PLAN:  *42 year old female with intractable nausea and vomiting with brown and blood-streaked emesis.  EGD February 2023 that showed grade C esophagitis and hiatal hernia.  Hemoglobin completely normal at her baseline 12.6 g.  BUN is normal.  Had been off of her PPI for about a week at home.  Is scheduled for outpatient EGD with her regular GI in 2 weeks.  She is feeling better and tolerated some full liquids.  Unfortunately because she had some grits we cannot perform EGD today.  She would like to  resume the full liquids and if she tolerates a more those and is feeling better later she would like to go home and just have her procedure as an outpatient.  We will plan for n.p.o. after midnight and inpatient procedure tomorrow, 8/2 if she is still here.  Otherwise she is on pantoprazole 40 mg IV daily here.  I did increase that to twice daily and she should be sent home on that dosing since she had misplaced her Aciphex at home.  Advised on full liquid diet and then advance to soft diet as tolerated, should only eat small amounts at a time.  Continue antiemetics as needed.   Princella Pellegrini. Olan Kurek  10/18/2022, 9:52 AM

## 2022-10-18 NOTE — Plan of Care (Signed)
  Problem: Education: Goal: Knowledge of General Education information will improve Description Including pain rating scale, medication(s)/side effects and non-pharmacologic comfort measures Outcome: Progressing   

## 2022-10-19 ENCOUNTER — Emergency Department (HOSPITAL_COMMUNITY): Payer: BC Managed Care – PPO | Admitting: Anesthesiology

## 2022-10-19 ENCOUNTER — Encounter (HOSPITAL_COMMUNITY)
Admission: EM | Disposition: A | Discharge status: Home / Self Care | Payer: Self-pay | Source: Home / Self Care | Attending: Emergency Medicine

## 2022-10-19 DIAGNOSIS — R112 Nausea with vomiting, unspecified: Secondary | ICD-10-CM

## 2022-10-19 DIAGNOSIS — K2210 Ulcer of esophagus without bleeding: Secondary | ICD-10-CM | POA: Diagnosis not present

## 2022-10-19 DIAGNOSIS — K2970 Gastritis, unspecified, without bleeding: Secondary | ICD-10-CM | POA: Diagnosis not present

## 2022-10-19 DIAGNOSIS — K209 Esophagitis, unspecified without bleeding: Secondary | ICD-10-CM

## 2022-10-19 HISTORY — PX: ESOPHAGOGASTRODUODENOSCOPY (EGD) WITH PROPOFOL: SHX5813

## 2022-10-19 HISTORY — PX: BIOPSY: SHX5522

## 2022-10-19 LAB — CBC
HCT: 41 % (ref 36.0–46.0)
Hemoglobin: 13.2 g/dL (ref 12.0–15.0)
MCH: 29.1 pg (ref 26.0–34.0)
MCHC: 32.2 g/dL (ref 30.0–36.0)
MCV: 90.5 fL (ref 80.0–100.0)
Platelets: 204 10*3/uL (ref 150–400)
RBC: 4.53 MIL/uL (ref 3.87–5.11)
RDW: 13.7 % (ref 11.5–15.5)
WBC: 6.8 10*3/uL (ref 4.0–10.5)
nRBC: 0 % (ref 0.0–0.2)

## 2022-10-19 LAB — CREATININE, SERUM
Creatinine, Ser: 1 mg/dL (ref 0.44–1.00)
GFR, Estimated: >60 mL/min (ref >60)

## 2022-10-19 SURGERY — ESOPHAGOGASTRODUODENOSCOPY (EGD) WITH PROPOFOL
Anesthesia: Monitor Anesthesia Care

## 2022-10-19 MED ORDER — PROPOFOL 10 MG/ML IV BOLUS
INTRAVENOUS | Status: DC | PRN
  Administered 2022-10-19: 100 mg via INTRAVENOUS

## 2022-10-19 MED ORDER — METOCLOPRAMIDE HCL 5 MG/ML IJ SOLN
5.0000 mg | Freq: Once | INTRAMUSCULAR | Status: AC
  Administered 2022-10-19: 5 mg via INTRAVENOUS
  Filled 2022-10-19: qty 2

## 2022-10-19 MED ORDER — PROMETHAZINE HCL 25 MG/ML IJ SOLN
INTRAMUSCULAR | Status: AC
  Filled 2022-10-19: qty 1

## 2022-10-19 MED ORDER — PROPOFOL 500 MG/50ML IV EMUL
INTRAVENOUS | Status: DC | PRN
  Administered 2022-10-19: 180 ug/kg/min via INTRAVENOUS

## 2022-10-19 MED ORDER — SODIUM CHLORIDE 0.9 % IV SOLN: 12.5000 mg | Freq: Four times a day (QID) | INTRAVENOUS | Status: DC | PRN

## 2022-10-19 MED ORDER — ACETAMINOPHEN 325 MG PO TABS: 650.0000 mg | ORAL_TABLET | Freq: Four times a day (QID) | ORAL | Status: DC | PRN

## 2022-10-19 MED ORDER — UBROGEPANT 100 MG PO TABS: 100.0000 mg | ORAL_TABLET | ORAL | Status: DC | PRN

## 2022-10-19 MED ORDER — PROMETHAZINE HCL 25 MG/ML IJ SOLN
INTRAMUSCULAR | Status: AC
  Administered 2022-10-19: 12.5 mg via INTRAVENOUS
  Filled 2022-10-19: qty 1

## 2022-10-19 MED ORDER — PANTOPRAZOLE SODIUM 40 MG IV SOLR
40.0000 mg | Freq: Two times a day (BID) | INTRAVENOUS | Status: DC
  Administered 2022-10-19 – 2022-10-21 (×5): 40 mg via INTRAVENOUS
  Filled 2022-10-19 (×5): qty 10

## 2022-10-19 MED ORDER — PROMETHAZINE (PHENERGAN) 6.25MG IN NS 50ML IVPB
6.2500 mg | INTRAVENOUS | Status: DC | PRN
  Administered 2022-10-19: 6.25 mg via INTRAVENOUS
  Filled 2022-10-19: qty 6.25

## 2022-10-19 MED ORDER — LORAZEPAM 2 MG/ML IJ SOLN
0.5000 mg | Freq: Once | INTRAMUSCULAR | Status: AC
  Administered 2022-10-19: 0.5 mg via INTRAVENOUS
  Filled 2022-10-19: qty 1

## 2022-10-19 MED ORDER — PROMETHAZINE HCL 25 MG/ML IJ SOLN: 6.2500 mg | INTRAMUSCULAR | Status: DC | PRN

## 2022-10-19 MED ORDER — HYDROXYZINE HCL 25 MG PO TABS: 25.0000 mg | ORAL_TABLET | Freq: Every day | ORAL | Status: DC | PRN

## 2022-10-19 MED ORDER — ALPRAZOLAM 0.25 MG PO TABS
1.0000 mg | ORAL_TABLET | Freq: Every day | ORAL | Status: DC | PRN
  Administered 2022-10-19: 1 mg via ORAL
  Filled 2022-10-19: qty 4

## 2022-10-19 MED ORDER — MELATONIN 5 MG PO TABS: 5.0000 mg | ORAL_TABLET | Freq: Every evening | ORAL | Status: DC | PRN

## 2022-10-19 MED ORDER — ENOXAPARIN SODIUM 80 MG/0.8ML IJ SOSY
65.0000 mg | PREFILLED_SYRINGE | INTRAMUSCULAR | Status: DC
  Administered 2022-10-19 – 2022-10-20 (×2): 65 mg via SUBCUTANEOUS
  Filled 2022-10-19 (×2): qty 0.8
  Filled 2022-10-19: qty 0.65

## 2022-10-19 MED ORDER — FOLIC ACID 1 MG PO TABS
1.0000 mg | ORAL_TABLET | Freq: Every day | ORAL | Status: DC
  Administered 2022-10-20 – 2022-10-21 (×2): 1 mg via ORAL
  Filled 2022-10-19 (×2): qty 1

## 2022-10-19 MED ORDER — ARIPIPRAZOLE 10 MG PO TABS
5.0000 mg | ORAL_TABLET | Freq: Every day | ORAL | Status: DC
  Administered 2022-10-19 – 2022-10-20 (×2): 5 mg via ORAL
  Filled 2022-10-19 (×2): qty 1

## 2022-10-19 MED ORDER — SODIUM CHLORIDE 0.9 % IV SOLN: INTRAVENOUS | Status: DC

## 2022-10-19 MED ORDER — FAMOTIDINE 20 MG PO TABS
40.0000 mg | ORAL_TABLET | Freq: Every day | ORAL | Status: DC
  Administered 2022-10-20 – 2022-10-21 (×2): 40 mg via ORAL
  Filled 2022-10-19 (×2): qty 2

## 2022-10-19 MED ORDER — SUCRALFATE 1 GM/10ML PO SUSP
1.0000 g | Freq: Three times a day (TID) | ORAL | Status: DC
  Administered 2022-10-19 – 2022-10-21 (×8): 1 g via ORAL
  Filled 2022-10-19 (×8): qty 10

## 2022-10-19 MED ORDER — POLYETHYLENE GLYCOL 3350 17 G PO PACK: 17.0000 g | PACK | Freq: Every day | ORAL | Status: DC | PRN

## 2022-10-19 MED ORDER — BUSPIRONE HCL 15 MG PO TABS
15.0000 mg | ORAL_TABLET | Freq: Two times a day (BID) | ORAL | Status: DC
  Administered 2022-10-19 – 2022-10-21 (×4): 15 mg via ORAL
  Filled 2022-10-19: qty 1
  Filled 2022-10-19: qty 1.5
  Filled 2022-10-19 (×4): qty 3
  Filled 2022-10-19 (×2): qty 1

## 2022-10-19 MED ORDER — BUPROPION HCL ER (XL) 300 MG PO TB24
450.0000 mg | ORAL_TABLET | Freq: Every day | ORAL | Status: DC
  Administered 2022-10-20 – 2022-10-21 (×2): 450 mg via ORAL
  Filled 2022-10-19 (×2): qty 1

## 2022-10-19 MED ORDER — FLUOXETINE HCL 20 MG PO CAPS
40.0000 mg | ORAL_CAPSULE | Freq: Every day | ORAL | Status: DC
  Administered 2022-10-20 – 2022-10-21 (×2): 40 mg via ORAL
  Filled 2022-10-19 (×3): qty 2

## 2022-10-19 MED ORDER — LIDOCAINE 2% (20 MG/ML) 5 ML SYRINGE
INTRAMUSCULAR | Status: DC | PRN
  Administered 2022-10-19: 100 mg via INTRAVENOUS

## 2022-10-19 MED ORDER — PANTOPRAZOLE SODIUM 40 MG PO TBEC: 40.0000 mg | DELAYED_RELEASE_TABLET | Freq: Two times a day (BID) | ORAL | Status: DC

## 2022-10-19 MED ORDER — PROCHLORPERAZINE EDISYLATE 10 MG/2ML IJ SOLN
5.0000 mg | Freq: Four times a day (QID) | INTRAMUSCULAR | Status: DC | PRN
  Administered 2022-10-19 – 2022-10-20 (×2): 5 mg via INTRAVENOUS
  Filled 2022-10-19 (×2): qty 2

## 2022-10-19 MED ORDER — LACTATED RINGERS IV SOLN: INTRAVENOUS | Status: DC

## 2022-10-19 MED ORDER — SODIUM CHLORIDE 0.9 % IV BOLUS
1000.0000 mL | Freq: Once | INTRAVENOUS | Status: AC
  Administered 2022-10-19: 1000 mL via INTRAVENOUS

## 2022-10-19 MED ORDER — LACTATED RINGERS IV SOLN
INTRAVENOUS | Status: DC | PRN
  Administered 2022-10-19: 1000 mL via INTRAVENOUS

## 2022-10-19 SURGICAL SUPPLY — 15 items
BLOCK BITE 60FR ADLT L/F BLUE (MISCELLANEOUS) ×3 IMPLANT
ELECT REM PT RETURN 9FT ADLT (ELECTROSURGICAL)
ELECTRODE REM PT RTRN 9FT ADLT (ELECTROSURGICAL) IMPLANT
FORCEP RJ3 GP 1.8X160 W-NEEDLE (CUTTING FORCEPS) IMPLANT
FORCEPS BIOP RAD 4 LRG CAP 4 (CUTTING FORCEPS) IMPLANT
NDL SCLEROTHERAPY 25GX240 (NEEDLE) IMPLANT
NEEDLE SCLEROTHERAPY 25GX240 (NEEDLE)
PROBE APC STR FIRE (PROBE) IMPLANT
PROBE INJECTION GOLD (MISCELLANEOUS)
PROBE INJECTION GOLD 7FR (MISCELLANEOUS) IMPLANT
SNARE SHORT THROW 13M SML OVAL (MISCELLANEOUS) IMPLANT
SYR 50ML LL SCALE MARK (SYRINGE) IMPLANT
TUBING ENDO SMARTCAP PENTAX (MISCELLANEOUS) ×6 IMPLANT
TUBING IRRIGATION ENDOGATOR (MISCELLANEOUS) ×3 IMPLANT
WATER STERILE IRR 1000ML POUR (IV SOLUTION) IMPLANT

## 2022-10-19 NOTE — ED Triage Notes (Signed)
Patient returned from endo, biopsies complete. Esophagitis. EDP to reevaluate after endo. VSS per endo nurse, Martine.

## 2022-10-19 NOTE — ED Notes (Signed)
ED TO INPATIENT HANDOFF REPORT  ED Nurse Name and Phone #: [phone]  S Name/Age/Gender Monique Shaw 42 y.o. female Room/Bed: 043C/043C  Code Status   Code Status: Full Code  Home/SNF/Other Home Patient oriented to: self, place, time, and situation Is this baseline? Yes   Triage Complete: Triage complete  Chief Complaint Intractable nausea and vomiting [R11.2]  Triage Note Patient BIB POV with complaint of nausea and vomiting.   Reports was discharge from [facility] today for same.  Patient reports vomiting multiple times today, went to UC, UC referred patient to come to ER for evaluation of high BP, nausea and vomiting.   BP in triage 162/96  Patient reports taking compazine and Zofran with no relief.   Patient returned from endo, biopsies complete. Esophagitis. EDP to reevaluate after endo. VSS per endo nurse, Martine.    Allergies No Known Allergies  Level of Care/Admitting Diagnosis ED Disposition     ED Disposition  Admit   Condition  --   Comment  Hospital Area: [facility] [[id number]]  Level of Care: Med-Surg [16]  May place patient in observation at [facility] or [facility] if equivalent level of care is available:: Yes  Covid Evaluation: Asymptomatic - no recent exposure (last 10 days) testing not required  Diagnosis: Intractable nausea and vomiting [720114]  Admitting Physician: Darlin Drop [[id number]]  Attending Physician: Darlin Drop [[id number]]          B Medical/Surgery History Past Medical History:  Diagnosis Date   Allergy    Anxiety    ASCVD (arteriosclerotic cardiovascular disease) 2011   Asthma    Blood type, Rh negative    Depression    GERD (gastroesophageal reflux disease)    H/O migraine 10/25/2010   H/O toxoplasmosis    H/O varicella    pt denies    History of elevated lipids 2011   Hx: UTI (urinary tract infection) 2007   Hypertension    IBS (irritable bowel syndrome)    Irregular menstrual cycle  2004   Migraine    Nausea & vomiting 04/27/2021   Obese    Sleep apnea    Weight gain 2004   Past Surgical History:  Procedure Laterality Date   BIOPSY  05/04/2021   Procedure: BIOPSY;  Surgeon: Kathi Der, MD;  Location: [facility];  Service: Gastroenterology;;   ESOPHAGOGASTRODUODENOSCOPY N/A 05/04/2021   Procedure: ESOPHAGOGASTRODUODENOSCOPY (EGD);  Surgeon: Kathi Der, MD;  Location: [facility];  Service: Gastroenterology;  Laterality: N/A;   gastroenteritis     LAPAROSCOPIC BILATERAL SALPINGECTOMY Left 11/18/2021   Procedure: LAPAROSCOPIC LEFT SALPINGECTOMY WITH REMOVAL OF ECTOPIC PREGNANCY;  Surgeon: Gerald Leitz, MD;  Location: [facility] OR;  Service: Gynecology;  Laterality: Left;   WISDOM TOOTH EXTRACTION       A IV Location/Drains/Wounds Patient Lines/Drains/Airways Status     Active Line/Drains/Airways     Name Placement date Placement time Site Days   Peripheral IV 10/19/22 20 G Anterior;Right;Upper Arm 10/19/22  0618  Arm  less than 1   Incision - 3 Ports Abdomen 1: Umbilicus 2: Left 3: Lower;Left 11/18/21  --  -- 335            Intake/Output Last 24 hours  Intake/Output Summary (Last 24 hours) at 10/19/2022 1454 Last data filed at 10/19/2022 0902 Gross per 24 hour  Intake 50 ml  Output 0 ml  Net 50 ml    Labs/Imaging Results for orders placed or performed during the hospital encounter  of 10/18/22 (from the past 48 hour(s))  Lipase, blood     Status: None   Collection Time: 10/18/22  8:40 PM  Result Value Ref Range   Lipase 27 11 - 51 U/L    Comment: Performed at [facility], [address], [place], [place] [address]  Comprehensive metabolic panel     Status: Abnormal   Collection Time: 10/18/22  8:40 PM  Result Value Ref Range   Sodium 136 135 - 145 mmol/L   Potassium 3.5 3.5 - 5.1 mmol/L   Chloride 105 98 - 111 mmol/L   CO2 20 (L) 22 - 32 mmol/L   Glucose, Bld 142 (H) 70 - 99 mg/dL    Comment: Glucose reference range applies only to  samples taken after fasting for at least 8 hours.   BUN 17 6 - 20 mg/dL   Creatinine, Ser 4.78 (H) 0.44 - 1.00 mg/dL   Calcium 8.9 8.9 - 29.5 mg/dL   Total Protein 6.9 6.5 - 8.1 g/dL   Albumin 3.8 3.5 - 5.0 g/dL   AST 21 15 - 41 U/L   ALT 14 0 - 44 U/L   Alkaline Phosphatase 78 38 - 126 U/L   Total Bilirubin 0.6 0.3 - 1.2 mg/dL   GFR, Estimated >62 >13 mL/min    Comment: (NOTE) Calculated using the CKD-EPI Creatinine Equation (2021)    Anion gap 11 5 - 15    Comment: Performed at [facility], [address], [place], [place] [address]  CBC     Status: None   Collection Time: 10/18/22  8:40 PM  Result Value Ref Range   WBC 5.8 4.0 - 10.5 K/uL   RBC 4.89 3.87 - 5.11 MIL/uL   Hemoglobin 14.2 12.0 - 15.0 g/dL   HCT 84.6 96.2 - 95.2 %   MCV 90.2 80.0 - 100.0 fL   MCH 29.0 26.0 - 34.0 pg   MCHC 32.2 30.0 - 36.0 g/dL   RDW 84.1 32.4 - 40.1 %   Platelets 215 150 - 400 K/uL   nRBC 0.0 0.0 - 0.2 %    Comment: Performed at [facility], [address] [place], [place] [address]  hCG, serum, qualitative     Status: None   Collection Time: 10/18/22  8:40 PM  Result Value Ref Range   Preg, Serum NEGATIVE NEGATIVE    Comment:        THE SENSITIVITY OF THIS METHODOLOGY IS >10 mIU/mL. Performed at [facility], [address] [place], [place] [address]   CBC     Status: None   Collection Time: 10/19/22 12:47 PM  Result Value Ref Range   WBC 6.8 4.0 - 10.5 K/uL   RBC 4.53 3.87 - 5.11 MIL/uL   Hemoglobin 13.2 12.0 - 15.0 g/dL   HCT 03.4 74.2 - 59.5 %   MCV 90.5 80.0 - 100.0 fL   MCH 29.1 26.0 - 34.0 pg   MCHC 32.2 30.0 - 36.0 g/dL   RDW 63.8 75.6 - 43.3 %   Platelets 204 150 - 400 K/uL   nRBC 0.0 0.0 - 0.2 %    Comment: Performed at [facility], [address] [place], [place] [address]  Creatinine, serum     Status: None   Collection Time: 10/19/22 12:47 PM  Result Value Ref Range   Creatinine, Ser 1.00 0.44 - 1.00 mg/dL   GFR, Estimated >84 >16  mL/min  Comment: (NOTE) Calculated using the CKD-EPI Creatinine Equation (2021) Performed at [facility], [address] [place], [place] [address]    No results found.  Pending Labs Unresulted Labs (From admission, onward)     Start     Ordered   10/26/22 0500  Creatinine, serum  (enoxaparin (LOVENOX)    CrCl >/= 30 ml/min)  Weekly,   R     Comments: while on enoxaparin therapy    10/19/22 1229   10/20/22 0500  CBC  Tomorrow morning,   R        10/19/22 1231   10/20/22 0500  Comprehensive metabolic panel  Tomorrow morning,   R        10/19/22 1231   10/20/22 0500  Magnesium  Tomorrow morning,   R        10/19/22 1231   10/20/22 0500  Phosphorus  Tomorrow morning,   R        10/19/22 1231   10/19/22 1231  Rapid urine drug screen (hospital performed)  ONCE - STAT,   STAT        10/19/22 1231   10/18/22 2130  Urinalysis, Routine w reflex microscopic -  Once,   URGENT        10/18/22 2130            Vitals/Pain Today's Vitals   10/19/22 1029 10/19/22 1040 10/19/22 1113 10/19/22 1240  BP: (!) 168/97  (!) 166/97 (!) 159/99  Pulse: 64 62 (!) 56 68  Resp: 16 (!) 24 12 17   Temp: 97.8 F (36.6 C)  98.9 F (37.2 C)   TempSrc:   Oral   SpO2: 100% 100% 90% 100%  Weight:      Height:      PainSc: 2        Isolation Precautions No active isolations  Medications Medications  sucralfate (CARAFATE) 1 GM/10ML suspension 1 g (1 g Oral Given 10/19/22 1200)  enoxaparin (LOVENOX) injection 65 mg (has no administration in time range)  acetaminophen (TYLENOL) tablet 650 mg (has no administration in time range)  prochlorperazine (COMPAZINE) injection 5 mg (has no administration in time range)  polyethylene glycol (MIRALAX / GLYCOLAX) packet 17 g (has no administration in time range)  melatonin tablet 5 mg (has no administration in time range)  lactated ringers infusion ( Intravenous New Bag/Given 10/19/22 1321)  pantoprazole (PROTONIX) injection 40 mg (40 mg Intravenous  Given 10/19/22 1322)  FLUoxetine (PROZAC) capsule 40 mg (has no administration in time range)  promethazine (PHENERGAN) 6.25 mg/NS 50 mL IVPB (has no administration in time range)  LORazepam (ATIVAN) injection 0.5 mg (0.5 mg Intravenous Given 10/19/22 0618)  sodium chloride 0.9 % bolus 1,000 mL (0 mLs Intravenous Stopped 10/19/22 1052)  metoCLOPramide (REGLAN) injection 5 mg (5 mg Intravenous Given 10/19/22 1200)    Mobility walks     Focused Assessments    R Recommendations: See Admitting Provider Note  Report given to:   Additional Notes:

## 2022-10-19 NOTE — Op Note (Addendum)
[facility] Patient Name: Monique Shaw Procedure Date : 10/19/2022 MRN: [id number] Attending MD: Particia Lather , , [id number] Date of Birth: [birth year] [name]: [id number] Age: 42 Admit Type: Inpatient Procedure:                Upper GI endoscopy Indications:              Follow-up of esophagitis, Nausea with vomiting Providers:                Nicole Kindred "Alan Ripper" Hortencia Conradi [facility], [facility], Technician Referring MD:             Hospitalist team Medicines:                Monitored Anesthesia Care Complications:            No immediate complications. Estimated Blood Loss:     Estimated blood loss was minimal. Procedure:                Pre-Anesthesia Assessment:                           - Prior to the procedure, a History and Physical                            was performed, and patient medications and                            allergies were reviewed. The patient's tolerance of                            previous anesthesia was also reviewed. The risks                            and benefits of the procedure and the sedation                            options and risks were discussed with the patient.                            All questions were answered, and informed consent                            was obtained. Prior Anticoagulants: The patient has                            taken no anticoagulant or antiplatelet agents. ASA                            Grade Assessment: III - A patient with severe                            systemic disease. After reviewing the risks and  benefits, the patient was deemed in satisfactory                            condition to undergo the procedure.                           After obtaining informed consent, the endoscope was                            passed under direct vision. Throughout the                            procedure, the patient's blood pressure, pulse, and                             oxygen saturations were monitored continuously. The                            [id number]-H190 ([id number]) Olympus endoscope was introduced                            through the mouth, and advanced to the second part                            of duodenum. The upper GI endoscopy was                            accomplished without difficulty. The patient                            tolerated the procedure well. Scope In: Scope Out: Findings:      LA Grade C (one or more mucosal breaks continuous between tops of 2 or       more mucosal folds, less than 75% circumference) esophagitis with no       bleeding was found in the distal esophagus. Biopsies were taken with a       cold forceps for histology.      A large hiatal hernia was present.      Localized inflammation characterized by congestion (edema), erythema and       granularity was found in the gastric fundus. Biopsies were taken with a       cold forceps for histology.      Retained fluid was found in the stomach.      The examined duodenum was normal. Impression:               - LA Grade C reflux esophagitis with no bleeding.                            Biopsied.                           - Large hiatal hernia.                           - Gastritis. Biopsied.                           -  Retained gastric fluid.                           - Normal examined duodenum. Recommendation:           - Return patient to ED for ongoing care.                           - Await pathology results.                           - PPI BID. Recommend increasing dosage from 20 mg                            to 40 mg BID.                           - Sucralfate suspension 1 g QID for 4 weeks.                           - Follow a low fat low fiber diet with small meals.                           - Repeat EGD in 2-3 months to assess for healing.                           - We will arrange for outpatient GI follow up.                            - The findings and recommendations were discussed                            with the patient. Procedure Code(s):        --- Professional ---                           [phone], Esophagogastroduodenoscopy, flexible,                            transoral; with biopsy, single or multiple Diagnosis Code(s):        --- Professional ---                           K21.00, Gastro-esophageal reflux disease with                            esophagitis, without bleeding                           K44.9, Diaphragmatic hernia without obstruction or                            gangrene                           K29.70, Gastritis, unspecified, without bleeding  R11.2, Nausea with vomiting, unspecified CPT copyright 2022 American Medical Association. All rights reserved. The codes documented in this report are preliminary and upon coder review may  be revised to meet current compliance requirements. Dr Particia Lather "Alan Ripper" Leonides Schanz,  10/19/2022 9:19:39 AM Number of Addenda: 0

## 2022-10-19 NOTE — ED Provider Notes (Signed)
[place] EMERGENCY DEPARTMENT AT [facility] Provider Note   [name]: [id number] Arrival date & time: 10/18/22  1952     History  Chief Complaint  Patient presents with   Emesis   Nausea    Monique Shaw is a 42 y.o. female who was recently admitted to the hospital for intractable nausea and vomiting, following gastroenterology with [place] Dr. Leonides Schanz.  Patient was discharged from the hospital yesterday, is scheduled for EGD this morning at 7:30 AM here at [facility].  Presents to the ED overnight for persistent intractable nausea and vomiting.  Patient has tried Compazine, Zofran, Phenergan suppositories at home without relief.  I have personally reviewed medical records.  Addition to the above listed history is history of OSA, anxiety depression hyperlipidemia, GERD.  On multiple medications per GI for this problem.   HPI     Home Medications Prior to Admission medications   Medication Sig Start Date End Date Taking? Authorizing Provider  ALPRAZolam Prudy Feeler) 1 MG tablet Take 1 mg by mouth daily as needed for sleep. 07/18/22   [provider]  ARIPiprazole (ABILIFY) 5 MG tablet Take 5 mg by mouth at bedtime. 08/30/22   [provider]  buPROPion (WELLBUTRIN XL) 150 MG 24 hr tablet Take 450 mg by mouth daily. 08/05/22   [provider]  busPIRone (BUSPAR) 15 MG tablet Take 15 mg by mouth 2 (two) times daily. 08/05/22   [provider]  Cholecalciferol (VITAMIN D3) 1.25 MG (50000 UT) CAPS Take 50,000 Units by mouth once a week. Wednesdays 02/21/21   [provider]  Coenzyme Q10 100 MG capsule Take 100 mg by mouth daily. 04/26/22 04/26/23  [provider]  Diethylpropion HCl CR 75 MG TB24 Take 75 mg by mouth daily. 09/14/22   [provider]  famotidine (PEPCID) 40 MG tablet Take 40 mg by mouth daily. 10/16/22   [provider]  FLUoxetine (PROZAC) 40 MG capsule Take 40 mg by mouth daily. 08/30/22   [provider]  folic acid (FOLVITE) 1 MG tablet Take 1 mg by mouth daily.    [provider]  hydrOXYzine (ATARAX) 25 MG tablet Take 25 mg by mouth daily as needed for itching. 09/24/22   [provider]  letrozole (FEMARA) 2.5 MG tablet Take 5 mg by mouth daily. 09/07/22   [provider]  magnesium oxide (MAG-OX) 400 (240 Mg) MG tablet Take 400 mg by mouth daily. 04/28/22   [provider]  metoCLOPramide (REGLAN) 10 MG tablet Take 1 tablet (10 mg total) by mouth 4 (four) times daily -  before meals and at bedtime. 10/18/22   Almon Hercules, MD  NIFEdipine (ADALAT CC) 30 MG 24 hr tablet Take 30 mg by mouth daily. 10/13/21   [provider]  ondansetron (ZOFRAN-ODT) 4 MG disintegrating tablet Take 4 mg by mouth as needed for nausea or vomiting. 10/08/22   [provider]  Prenatal Vit-Fe Fumarate-FA (PRENATAL MULTIVITAMIN) TABS tablet Take 1 tablet by mouth daily at 12 noon.    [provider]  PROMETHEGAN 25 MG suppository Place 1 suppository (25 mg total) rectally every 6 (six) hours as needed for refractory nausea / vomiting. 10/18/22   Almon Hercules, MD  RABEprazole (ACIPHEX) 20 MG tablet Take 1 tablet (20 mg total) by mouth 2 (two) times daily. 10/18/22   Almon Hercules, MD  traZODone (DESYREL) 50 MG tablet Take 1 tablet (50 mg total) by mouth at  bedtime as needed for sleep. Patient not taking: Reported on 11/18/2021 05/05/21   Rhetta Mura, MD  UBRELVY 100 MG TABS Take 100 mg by mouth as needed (migraine). 08/31/22   [provider]      Allergies    Patient has no known allergies.    Review of Systems   Review of Systems  Gastrointestinal:  Positive for nausea and vomiting. Negative for abdominal pain.    Physical Exam Updated Vital Signs BP (!) 155/88   Pulse 64   Temp 98.6 F (37 C)   Resp 16   Ht 5\' 9"  (1.753 m)   Wt 136.1 kg   LMP 09/25/2022   SpO2 99%   BMI 44.30 kg/m  Physical Exam Vitals and nursing  note reviewed.  Constitutional:      Appearance: She is obese. She is not ill-appearing or toxic-appearing.  HENT:     Head: Normocephalic and atraumatic.     Mouth/Throat:     Mouth: Mucous membranes are moist.     Pharynx: No oropharyngeal exudate or posterior oropharyngeal erythema.  Eyes:     General:        Right eye: No discharge.        Left eye: No discharge.     Conjunctiva/sclera: Conjunctivae normal.  Cardiovascular:     Rate and Rhythm: Normal rate and regular rhythm.     Pulses: Normal pulses.     Heart sounds: Normal heart sounds. No murmur heard. Pulmonary:     Effort: Pulmonary effort is normal. No respiratory distress.     Breath sounds: Normal breath sounds. No wheezing or rales.  Abdominal:     General: Bowel sounds are normal. There is no distension.     Palpations: Abdomen is soft.     Tenderness: There is no abdominal tenderness. There is no guarding or rebound.  Musculoskeletal:        General: No deformity.     Cervical back: Neck supple.  Skin:    General: Skin is warm and dry.     Capillary Refill: Capillary refill takes less than 2 seconds.  Neurological:     General: No focal deficit present.     Mental Status: She is alert and oriented to person, place, and time. Mental status is at baseline.  Psychiatric:        Mood and Affect: Mood normal.     ED Results / Procedures / Treatments   Labs (all labs ordered are listed, but only abnormal results are displayed) Labs Reviewed  COMPREHENSIVE METABOLIC PANEL - Abnormal; Notable for the following components:      Result Value   CO2 20 (*)    Glucose, Bld 142 (*)    Creatinine, Ser 1.10 (*)    All other components within normal limits  LIPASE, BLOOD  CBC  HCG, SERUM, QUALITATIVE  URINALYSIS, ROUTINE W REFLEX MICROSCOPIC    EKG EKG Interpretation Date/Time:  Thursday October 18 2022 19:54:31 EDT Ventricular Rate:  78 PR Interval:  122 QRS Duration:  94 QT Interval:  382 QTC  Calculation: 435 R Axis:   -45  Text Interpretation: Normal sinus rhythm with sinus arrhythmia Incomplete right bundle branch block Left anterior fascicular block Moderate voltage criteria for LVH, may be normal variant ( R in aVL , Cornell product ) Septal infarct , age undetermined Abnormal ECG When compared with ECG of 17-Oct-2022 05:48, PREVIOUS ECG IS PRESENT No significant change was found Confirmed by Glynn Octave [phone]) on  10/19/2022 3:19:34 AM  Radiology No results found.  Procedures Procedures    Medications Ordered in ED Medications  LORazepam (ATIVAN) injection 0.5 mg (0.5 mg Intravenous Given 10/19/22 0618)  sodium chloride 0.9 % bolus 1,000 mL (1,000 mLs Intravenous New Bag/Given 10/19/22 1696)    ED Course/ Medical Decision Making/ A&P Clinical Course as of 10/19/22 0701  Fri Oct 19, 2022  [id number] [phone] [RS]    Clinical Course User Index [RS] Paris Lore, PA-C                                 Medical Decision Making 56 female with intractable nausea and vomiting unclear etiology, returns for same.  Vomiting throughout her stay in the ED.  Hypertensive on intake and vitals otherwise normal.Unfortunately patient was for difficult venous access, IV team did secure line and fluids and Ativan administered with improvement patient's symptomatology.  Cardiopulmonary abdominal exams are benign.    Amount and/or Complexity of Data Reviewed Labs: ordered.    Details: CBC without leukocytosis or anemia, CMP with mild elevation creatinine 1.1 near patient's baseline.  She is not pregnant.  Lipase is normal.    Risk Prescription drug management.   Consult to Dr. Adela Lank, [facility] who is agreeable to sending patient to endoscopy from the ED.  Endo RN Florentina Addison also contacted by this provider, states that she can come collect the patient around 7 AM for transport upstairs to endoscopy.  I appreciate their collaboration in the care of this patient.  Secure chat  sent to Dr. Adela Lank and Dr. Leonides Schanz who is scheduled to perform EGD this morning.  Mylei  voiced understanding of her medical evaluation and treatment plan. Each of their questions answered to their expressed satisfaction.  Return precautions were given.  Patient is well-appearing, stable, and was discharged in good condition.  This chart was dictated using voice recognition software, Dragon. Despite the best efforts of this provider to proofread and correct errors, errors may still occur which can change documentation meaning.  Final Clinical Impression(s) / ED Diagnoses Final diagnoses:  None    Rx / DC Orders ED Discharge Orders     None         Sherrilee Gilles 10/19/22 0701    Glynn Octave, MD 10/19/22 [phone]

## 2022-10-19 NOTE — Anesthesia Preprocedure Evaluation (Addendum)
Anesthesia Evaluation  Patient identified by MRN, date of birth, ID band Patient awake    Reviewed: Allergy & Precautions, NPO status , Patient's Chart, lab work & pertinent test results  Airway Mallampati: II  TM Distance: >3 FB Neck ROM: Full    Dental no notable dental hx.    Pulmonary asthma , sleep apnea    Pulmonary exam normal        Cardiovascular hypertension, Normal cardiovascular exam     Neuro/Psych  Headaches PSYCHIATRIC DISORDERS Anxiety Depression       GI/Hepatic Neg liver ROS,GERD  Medicated and Controlled,,  Endo/Other    Morbid obesity  Renal/GU negative Renal ROS     Musculoskeletal negative musculoskeletal ROS (+)    Abdominal  (+) + obese  Peds  Hematology negative hematology ROS (+)   Anesthesia Other Findings Nausea, vomiting GERD  Reproductive/Obstetrics Hcg negative                              Anesthesia Physical Anesthesia Plan  ASA: 3  Anesthesia Plan: MAC   Post-op Pain Management:    Induction: Intravenous  PONV Risk Score and Plan: 2 and Propofol infusion  Airway Management Planned: Nasal Cannula  Additional Equipment:   Intra-op Plan:   Post-operative Plan:   Informed Consent: I have reviewed the patients History and Physical, chart, labs and discussed the procedure including the risks, benefits and alternatives for the proposed anesthesia with the patient or authorized representative who has indicated his/her understanding and acceptance.     Dental advisory given  Plan Discussed with: CRNA  Anesthesia Plan Comments:        Anesthesia Quick Evaluation

## 2022-10-19 NOTE — ED Provider Notes (Signed)
This is a patient was seen and evaluated overnight for intractable vomiting after having been admitted to the hospitalist service and discharged yesterday.  Patient was apparently taken to the endoscopy suite earlier by Dr. Leonides Schanz.  She was shown to have gastritis and esophagitis.  She was returned to the emergency department to be admitted by the hospitalist service for intractable vomiting as GI states that they do not have a reason to bring her in on their service. Since returning to the emergency department I attempted to p.o. challenge the patient however she began vomiting prior to p.o. challenge.  Patient admitted to Dr. Margo Aye [facility]   Arthor Captain, PA-C 10/20/22 1640    Margarita Grizzle, MD 10/22/22 [phone]

## 2022-10-19 NOTE — Anesthesia Procedure Notes (Signed)
Procedure Name: MAC Date/Time: 10/19/2022 8:50 AM  Performed by: Cy Blamer, CRNAPre-anesthesia Checklist: Patient identified, Emergency Drugs available, Suction available, Patient being monitored and Timeout performed Oxygen Delivery Method: Supernova nasal CPAP Induction Type: IV induction Dental Injury: Teeth and Oropharynx as per pre-operative assessment

## 2022-10-19 NOTE — Anesthesia Postprocedure Evaluation (Signed)
Anesthesia Post Note  Patient: Monique Shaw  Procedure(s) Performed: ESOPHAGOGASTRODUODENOSCOPY (EGD) WITH PROPOFOL BIOPSY     Patient location during evaluation: Endoscopy Anesthesia Type: MAC Level of consciousness: awake Pain management: pain level controlled Vital Signs Assessment: post-procedure vital signs reviewed and stable Respiratory status: spontaneous breathing, nonlabored ventilation and respiratory function stable Cardiovascular status: blood pressure returned to baseline and stable Postop Assessment: no apparent nausea or vomiting Anesthetic complications: no   No notable events documented.  Last Vitals:  Vitals:   10/19/22 1113 10/19/22 1240  BP: (!) 166/97 (!) 159/99  Pulse: (!) 56 68  Resp: 12 17  Temp: 37.2 C   SpO2: 90% 100%    Last Pain:  Vitals:   10/19/22 1113  TempSrc: Oral  PainSc:                  Davona Kinoshita P Anniah Glick

## 2022-10-19 NOTE — Transfer of Care (Signed)
Immediate Anesthesia Transfer of Care Note  Patient: ANNELY SLIVA  Procedure(s) Performed: ESOPHAGOGASTRODUODENOSCOPY (EGD) WITH PROPOFOL BIOPSY  Patient Location: Endoscopy Unit  Anesthesia Type:MAC  Level of Consciousness: awake, oriented, drowsy, patient cooperative, and responds to stimulation  Airway & Oxygen Therapy: Patient Spontanous Breathing  Post-op Assessment: Report given to RN, Post -op Vital signs reviewed and stable, Patient moving all extremities X 4, and Patient able to stick tongue midline  Post vital signs: Reviewed and stable  Last Vitals:  Vitals Value Taken Time  BP 140/82 10/19/22 0912  Temp 36.2 C 10/19/22 0909  Pulse 92 10/19/22 0912  Resp 20 10/19/22 0914  SpO2 95 % 10/19/22 0912  Vitals shown include unfiled device data.  Last Pain:  Vitals:   10/19/22 0909  TempSrc: Temporal  PainSc: Asleep         Complications: No notable events documented.

## 2022-10-19 NOTE — Interval H&P Note (Signed)
History and Physical Interval Note:  10/19/2022 8:33 AM  Monique Shaw  has presented today for surgery, with the diagnosis of Nausea, vomiting, GERD.  The various methods of treatment have been discussed with the patient and family. After consideration of risks, benefits and other options for treatment, the patient has consented to  Procedure(s): ESOPHAGOGASTRODUODENOSCOPY (EGD) WITH PROPOFOL (N/A) as a surgical intervention.  The patient's history has been reviewed, patient examined, no change in status, stable for surgery.  I have reviewed the patient's chart and labs.  Questions were answered to the patient's satisfaction.     [name]

## 2022-10-19 NOTE — H&P (Signed)
History and Physical  AVON MOLOCK WUJ:[id number] DOB: Jan 22, 1981 DOA: 10/18/2022  Referring physician: Arthor Captain, PA-EDP  PCP: Harvie Heck, MD  Outpatient Specialists: None Patient coming from: Home  Chief Complaint: Nausea and vomiting.  HPI: Monique Shaw is a 42 y.o. female with medical history significant for obesity, hypertension, hyperlipidemia, asthma, GERD, OSA, chronic anxiety/depression, gastritis, history of grade C esophagitis/negative H. pylori gastritis seen on EGD done in February 2023, recently admitted on 10/17/2022 for intractable nausea and vomiting.  Was evaluated by GI, improved after restarting her home PPI with improvement of her symptomatology.  GI had recommended to continue her PPI twice daily, suspect that her nausea and vomiting were related to her uncontrolled GERD and scheduled an outpatient EGD in 2 weeks as the patient wanted to go home on 10/18/2022.  She returns today with recurrent symptoms.  Was evaluated again by GI, status post EGD on 10/19/2022 which revealed: LA grade C reflux esophagitis with no bleeding, biopsied. Large hiatal hernia. Gastritis, biopsied. Retained gastric fluid. Normal examined duodenum. GI recommendation for PPI twice daily, increase dosage from 20 mg to 40 mg twice daily.  Sucralfate suspension 1 g 4 times daily for 4 weeks.  To follow a low-fat low fiber diet with small meals.  Repeat EGD in 2 to 3 months to assess for healing.  GI will arrange for outpatient GI follow-up.  The patient had a p.o. challenge in the ED which she failed.  EDP requested admission due to intractable nausea and vomiting and able to tolerate p.o. intake at this time.  [facility], hospitalist service was asked to admit.  Admitted to MedSurg unit as observation status.  ED Course: Temperature 98.9.  BP 166/97, pulse 56, respiration rate 12, O2 saturation 90% on room air.  Lab studies done on 10/18/2022 serum bicarb 20, serum glucose 142.  Review of  Systems: Review of systems as noted in the HPI. All other systems reviewed and are negative.   Past Medical History:  Diagnosis Date   Allergy    Anxiety    ASCVD (arteriosclerotic cardiovascular disease) 2011   Asthma    Blood type, Rh negative    Depression    GERD (gastroesophageal reflux disease)    H/O migraine 10/25/2010   H/O toxoplasmosis    H/O varicella    pt denies    History of elevated lipids 2011   Hx: UTI (urinary tract infection) 2007   Hypertension    IBS (irritable bowel syndrome)    Irregular menstrual cycle 2004   Migraine    Nausea & vomiting 04/27/2021   Obese    Sleep apnea    Weight gain 2004   Past Surgical History:  Procedure Laterality Date   BIOPSY  05/04/2021   Procedure: BIOPSY;  Surgeon: Kathi Der, MD;  Location: [facility];  Service: Gastroenterology;;   ESOPHAGOGASTRODUODENOSCOPY N/A 05/04/2021   Procedure: ESOPHAGOGASTRODUODENOSCOPY (EGD);  Surgeon: Kathi Der, MD;  Location: [facility];  Service: Gastroenterology;  Laterality: N/A;   gastroenteritis     LAPAROSCOPIC BILATERAL SALPINGECTOMY Left 11/18/2021   Procedure: LAPAROSCOPIC LEFT SALPINGECTOMY WITH REMOVAL OF ECTOPIC PREGNANCY;  Surgeon: Gerald Leitz, MD;  Location: [facility] OR;  Service: Gynecology;  Laterality: Left;   WISDOM TOOTH EXTRACTION      Social History:  reports that she has never smoked. She has never used smokeless tobacco. She reports that she does not drink alcohol and does not use drugs.   No Known Allergies  Family History  Problem Relation  Age of Onset   Heart disease Mother    Hypertension Mother    Cancer Mother    Hypertension Father    Anemia Father    Hypertension Maternal Grandmother    Heart disease Paternal Grandfather        MI   Cancer Maternal Uncle    Cancer Paternal Aunt    Alcohol abuse Paternal Uncle       Prior to Admission medications   Medication Sig Start Date End Date Taking? Authorizing Provider  ALPRAZolam Prudy Feeler) 1  MG tablet Take 1 mg by mouth daily as needed for sleep. 07/18/22   [provider]  ARIPiprazole (ABILIFY) 5 MG tablet Take 5 mg by mouth at bedtime. 08/30/22   [provider]  buPROPion (WELLBUTRIN XL) 150 MG 24 hr tablet Take 450 mg by mouth daily. 08/05/22   [provider]  busPIRone (BUSPAR) 15 MG tablet Take 15 mg by mouth 2 (two) times daily. 08/05/22   [provider]  Cholecalciferol (VITAMIN D3) 1.25 MG (50000 UT) CAPS Take 50,000 Units by mouth once a week. Wednesdays 02/21/21   [provider]  Coenzyme Q10 100 MG capsule Take 100 mg by mouth daily. 04/26/22 04/26/23  [provider]  Diethylpropion HCl CR 75 MG TB24 Take 75 mg by mouth daily. 09/14/22   [provider]  famotidine (PEPCID) 40 MG tablet Take 40 mg by mouth daily. 10/16/22   [provider]  FLUoxetine (PROZAC) 40 MG capsule Take 40 mg by mouth daily. 08/30/22   [provider]  folic acid (FOLVITE) 1 MG tablet Take 1 mg by mouth daily.    [provider]  hydrOXYzine (ATARAX) 25 MG tablet Take 25 mg by mouth daily as needed for itching. 09/24/22   [provider]  letrozole (FEMARA) 2.5 MG tablet Take 5 mg by mouth daily. 09/07/22   [provider]  magnesium oxide (MAG-OX) 400 (240 Mg) MG tablet Take 400 mg by mouth daily. 04/28/22   [provider]  metoCLOPramide (REGLAN) 10 MG tablet Take 1 tablet (10 mg total) by mouth 4 (four) times daily -  before meals and at bedtime. 10/18/22   Almon Hercules, MD  NIFEdipine (ADALAT CC) 30 MG 24 hr tablet Take 30 mg by mouth daily. 10/13/21   [provider]  ondansetron (ZOFRAN-ODT) 4 MG disintegrating tablet Take 4 mg by mouth as needed for nausea or vomiting. 10/08/22   [provider]  Prenatal Vit-Fe Fumarate-FA (PRENATAL MULTIVITAMIN) TABS tablet Take 1 tablet by mouth daily at 12 noon.    [provider]  PROMETHEGAN 25 MG suppository Place 1  suppository (25 mg total) rectally every 6 (six) hours as needed for refractory nausea / vomiting. 10/18/22   Almon Hercules, MD  RABEprazole (ACIPHEX) 20 MG tablet Take 1 tablet (20 mg total) by mouth 2 (two) times daily. 10/18/22   Almon Hercules, MD  traZODone (DESYREL) 50 MG tablet Take 1 tablet (50 mg total) by mouth at bedtime as needed for sleep. Patient not taking: Reported on 11/18/2021 05/05/21   Rhetta Mura, MD  UBRELVY 100 MG TABS Take 100 mg by mouth as needed (migraine). 08/31/22   [provider]    Physical Exam: BP (!) 166/97 (BP Location: Left Arm)   Pulse (!) 56   Temp 98.9 F (37.2 C) (Oral)   Resp 12   Ht 5\' 9"  (1.753 m)   Wt 136.1 kg   LMP  09/25/2022 Comment: Negative urine preg from ED 7/31  SpO2 90%   BMI 44.30 kg/m   General: 42 y.o. year-old female well developed well nourished in no acute distress.  Alert and oriented x3. Cardiovascular: Regular rate and rhythm with no rubs or gallops.  No thyromegaly or JVD noted.  No lower extremity edema. 2/4 pulses in all 4 extremities. Respiratory: Clear to auscultation with no wheezes or rales. Good inspiratory effort. Abdomen: Soft nontender nondistended with normal bowel sounds x4 quadrants. Muskuloskeletal: No cyanosis, clubbing or edema noted bilaterally Neuro: CN II-XII intact, strength, sensation, reflexes Skin: No ulcerative lesions noted or rashes Psychiatry: Judgement and insight appear normal. Mood is appropriate for condition and setting          Labs on Admission:  Basic Metabolic Panel: Recent Labs  Lab 10/17/22 0459 10/18/22 0721 10/18/22 2040  NA 137 136 136  K 3.7 3.5 3.5  CL 102 105 105  CO2 23 25 20*  GLUCOSE 170* 93 142*  BUN 14 15 17   CREATININE 0.97 1.08* 1.10*  CALCIUM 9.4 8.3* 8.9  MG  --  2.0  --    Liver Function Tests: Recent Labs  Lab 10/17/22 0459 10/18/22 0721 10/18/22 2040  AST 22 15 21   ALT 14 13 14   ALKPHOS 85 59 78  BILITOT 0.8 1.0 0.6  PROT 8.0 5.7*  6.9  ALBUMIN 4.2 2.9* 3.8   Recent Labs  Lab 10/17/22 0459 10/18/22 2040  LIPASE 28 27   No results for input(s): "AMMONIA" in the last 168 hours. CBC: Recent Labs  Lab 10/17/22 0459 10/18/22 0351 10/18/22 2040  WBC 10.0 7.3 5.8  HGB 15.7* 12.6 14.2  HCT 46.4* 38.1 44.1  MCV 86.9 90.5 90.2  PLT 269 183 215   Cardiac Enzymes: No results for input(s): "CKTOTAL", "CKMB", "CKMBINDEX", "TROPONINI" in the last 168 hours.  BNP (last 3 results) No results for input(s): "BNP" in the last 8760 hours.  ProBNP (last 3 results) No results for input(s): "PROBNP" in the last 8760 hours.  CBG: No results for input(s): "GLUCAP" in the last 168 hours.  Radiological Exams on Admission: No results found.  EKG: I independently viewed the EKG done and my findings are as followed: Ectopic atrial rhythm rate of 66.  Nonspecific ST-T changes.  QTc 418.  Assessment/Plan Present on Admission:  Intractable nausea and vomiting  Principal Problem:   Intractable nausea and vomiting Active Problems:   Acute esophagitis  Intractable nausea and vomiting, suspect secondary to uncontrolled GERD, possible gastroparesis Post EGD on 10/19/2022 by Dr. Leonides Schanz with recommendation as stated above. Continue twice daily PPI as recommended by GI Continue sucralfate as recommended by GI x 4 weeks Will need repeat EGD in 2 to 3 months to assess for healing. IV antiemetics as needed Gentle IV fluid hydration LR at 75 cc/h x 1 day Start with clear liquid diet, advance as tolerated to low-fat low fiber diet with small meals.  History of grade C esophagitis/negative H. pylori gastritis seen on EGD done in February 2023,  Repeat EGD done on 10/19/2022 with findings as stated above. Continue management as stated above.  Chronic anxiety/depression Resume home regimen once home medications have been reconciled.  Obesity BMI 44 Recommend weight loss outpatient with regular physical activity and healthy  dieting.    Time: 75 minutes.    DVT prophylaxis: Subcu Lovenox daily  Code Status: Full code  Family Communication: None at bedside  Disposition Plan: Admitted to MedSurg unit  Consults called: [facility]  Admission status: Observation status.   Status is: Observation    Darlin Drop MD [facility] Pager [phone]  If 7PM-7AM, please contact night-coverage [email or web address] Password [facility]  10/19/2022, 12:31 PM

## 2022-10-20 ENCOUNTER — Encounter (HOSPITAL_COMMUNITY): Payer: Self-pay | Admitting: Internal Medicine

## 2022-10-20 DIAGNOSIS — R112 Nausea with vomiting, unspecified: Secondary | ICD-10-CM | POA: Diagnosis not present

## 2022-10-20 MED ORDER — POTASSIUM CHLORIDE CRYS ER 20 MEQ PO TBCR
40.0000 meq | EXTENDED_RELEASE_TABLET | Freq: Once | ORAL | Status: AC
  Administered 2022-10-20: 40 meq via ORAL
  Filled 2022-10-20: qty 2

## 2022-10-20 NOTE — Plan of Care (Signed)

## 2022-10-20 NOTE — Plan of Care (Signed)
  Problem: Health Behavior/Discharge Planning: Goal: Ability to manage health-related needs will improve Outcome: Progressing   Problem: Coping: Goal: Level of anxiety will decrease Outcome: Progressing   Problem: Safety: Goal: Ability to remain free from injury will improve Outcome: Progressing   Problem: Skin Integrity: Goal: Risk for impaired skin integrity will decrease Outcome: Progressing   

## 2022-10-20 NOTE — Progress Notes (Addendum)
PROGRESS NOTE        PATIENT DETAILS Name: Monique Shaw Age: 42 y.o. Sex: female Date of Birth: 1981-01-04 Admit Date: 10/18/2022 Admitting Physician Darlin Drop, DO ZDG:LOVFIEP, Mishi, MD  Brief Summary: Patient is a 42 y.o.  female with history of recent hospitalization for intractable nausea/vomiting-presented with another episode of recurrent/intractable nausea/vomiting-evaluated by GI-underwent EGD which showed erosive esophagitis/gastritis-subsequently admitted to the hospitalist service.   Significant events: 8/1>> admit to [facility]  Significant studies: None  Significant microbiology data: None  Procedures: 8/2>> EGD  Consults: GI  Subjective: No vomiting overnight-tolerating clears-advance to full liquids which she has not yet started.  Very anxious about going home today-as she has had recurrent trips to the hospital for similar episodes of nausea/vomiting.  Objective: Vitals: Blood pressure 118/77, pulse 84, temperature 98.2 F (36.8 C), temperature source Oral, resp. rate 16, height 5\' 9"  (1.753 m), weight 136.1 kg, last menstrual period 09/25/2022, SpO2 96%, unknown if currently breastfeeding.   Exam: Gen Exam:Alert awake-not in any distress HEENT:atraumatic, normocephalic Chest: B/L clear to auscultation anteriorly CVS:S1S2 regular Abdomen:soft non tender, non distended Extremities:no edema Neurology: Non focal Skin: no rash  Pertinent Labs/Radiology:    Latest Ref Rng & Units 10/20/2022    4:12 AM 10/19/2022   12:47 PM 10/18/2022    8:40 PM  CBC  WBC 4.0 - 10.5 K/uL 5.8  6.8  5.8   Hemoglobin 12.0 - 15.0 g/dL 32.9  51.8  84.1   Hematocrit 36.0 - 46.0 % 37.0  41.0  44.1   Platelets 150 - 400 K/uL 195  204  215     Lab Results  Component Value Date   NA 135 10/20/2022   K 3.0 (L) 10/20/2022   CL 103 10/20/2022   CO2 24 10/20/2022      Assessment/Plan: Intractable nausea/vomiting Recurrent issue Felt to be due to LA  grade C reflux esophagitis and gastritis S/p EGD 8/2 Advance diet today Mobilize Continue PPI/Carafate If continues to do well-likely home on 8/4.  Hypokalemia Repeat/recheck  Anxiety/depression Anxious about going home-given recurrent intractable nausea/vomiting and repeated hospitalization Continue Abilify/Wellbutrin/BuSpar/Prozac  Migraine headache Currently stable  Morbid Obesity: Estimated body mass index is 44.3 kg/m as calculated from the following:   Height as of this encounter: 5\' 9"  (1.753 m).   Weight as of this encounter: 136.1 kg.   Code status:   Code Status: Full Code   DVT Prophylaxis:SQ lovenox  Family Communication: None at bedside   Disposition Plan: Status is: Observation The patient will require care spanning > 2 midnights and should be moved to inpatient because: Severity of illness   Planned Discharge Destination:Home likely on 8/4 if no further nausea or vomiting.   Diet: Diet Order             Diet full liquid Fluid consistency: Thin  Diet effective now                     Antimicrobial agents: Anti-infectives (From admission, onward)    None        MEDICATIONS: Scheduled Meds:  ARIPiprazole  5 mg Oral QHS   buPROPion  450 mg Oral Daily   busPIRone  15 mg Oral BID   enoxaparin (LOVENOX) injection  65 mg Subcutaneous Q24H   famotidine  40 mg Oral Daily   FLUoxetine  40 mg Oral Daily   folic acid  1 mg Oral Daily   pantoprazole (PROTONIX) IV  40 mg Intravenous BID   sucralfate  1 g Oral TID WC & HS   Continuous Infusions:  lactated ringers 75 mL/hr at 10/20/22 0334   promethazine (PHENERGAN) injection (IM or IVPB) Stopped (10/19/22 1616)   PRN Meds:.acetaminophen, ALPRAZolam, hydrOXYzine, melatonin, polyethylene glycol, prochlorperazine, promethazine (PHENERGAN) injection (IM or IVPB)   I have personally reviewed following labs and imaging studies  LABORATORY DATA: CBC: Recent Labs  Lab 10/17/22 0459  10/18/22 0351 10/18/22 2040 10/19/22 1247 10/20/22 0412  WBC 10.0 7.3 5.8 6.8 5.8  HGB 15.7* 12.6 14.2 13.2 12.6  HCT 46.4* 38.1 44.1 41.0 37.0  MCV 86.9 90.5 90.2 90.5 89.2  PLT 269 183 215 204 195    Basic Metabolic Panel: Recent Labs  Lab 10/17/22 0459 10/18/22 0721 10/18/22 2040 10/19/22 1247 10/20/22 0412  NA 137 136 136  --  135  K 3.7 3.5 3.5  --  3.0*  CL 102 105 105  --  103  CO2 23 25 20*  --  24  GLUCOSE 170* 93 142*  --  97  BUN 14 15 17   --  9  CREATININE 0.97 1.08* 1.10* 1.00 0.98  CALCIUM 9.4 8.3* 8.9  --  8.4*  MG  --  2.0  --   --  1.9  PHOS  --   --   --   --  3.3    GFR: Estimated Creatinine Clearance: 110.1 mL/min (by C-G formula based on SCr of 0.98 mg/dL).  Liver Function Tests: Recent Labs  Lab 10/17/22 0459 10/18/22 0721 10/18/22 2040 10/20/22 0412  AST 22 15 21 17   ALT 14 13 14 14   ALKPHOS 85 59 78 61  BILITOT 0.8 1.0 0.6 1.1  PROT 8.0 5.7* 6.9 5.6*  ALBUMIN 4.2 2.9* 3.8 3.0*   Recent Labs  Lab 10/17/22 0459 10/18/22 2040  LIPASE 28 27   No results for input(s): "AMMONIA" in the last 168 hours.  Coagulation Profile: No results for input(s): "INR", "PROTIME" in the last 168 hours.  Cardiac Enzymes: No results for input(s): "CKTOTAL", "CKMB", "CKMBINDEX", "TROPONINI" in the last 168 hours.  BNP (last 3 results) No results for input(s): "PROBNP" in the last 8760 hours.  Lipid Profile: No results for input(s): "CHOL", "HDL", "LDLCALC", "TRIG", "CHOLHDL", "LDLDIRECT" in the last 72 hours.  Thyroid Function Tests: No results for input(s): "TSH", "T4TOTAL", "FREET4", "T3FREE", "THYROIDAB" in the last 72 hours.  Anemia Panel: No results for input(s): "VITAMINB12", "FOLATE", "FERRITIN", "TIBC", "IRON", "RETICCTPCT" in the last 72 hours.  Urine analysis:    Component Value Date/Time   COLORURINE YELLOW 10/17/2022 0459   APPEARANCEUR HAZY (A) 10/17/2022 0459   LABSPEC >=1.030 10/17/2022 0459   PHURINE 6.0 10/17/2022 0459    GLUCOSEU NEGATIVE 10/17/2022 0459   HGBUR NEGATIVE 10/17/2022 0459   BILIRUBINUR SMALL (A) 10/17/2022 0459   BILIRUBINUR negative 01/11/2013 1133   KETONESUR 15 (A) 10/17/2022 0459   PROTEINUR 100 (A) 10/17/2022 0459   UROBILINOGEN 2.0 01/11/2013 1133   UROBILINOGEN 0.2 09/20/2010 1813   NITRITE NEGATIVE 10/17/2022 0459   LEUKOCYTESUR TRACE (A) 10/17/2022 0459    Sepsis Labs: Lactic Acid, Venous No results found for: "LATICACIDVEN"  MICROBIOLOGY: No results found for this or any previous visit (from the past 240 hour(s)).  RADIOLOGY STUDIES/RESULTS: No results found.   LOS: 0 days   Jeoffrey Massed, MD  [facility]  To contact the attending provider between 7A-7P or the covering provider during after hours 7P-7A, please log into the web site [email or web address] and access using universal [place] password for that web site. If you do not have the password, please call the hospital operator.  10/20/2022, 10:36 AM

## 2022-10-21 ENCOUNTER — Ambulatory Visit (HOSPITAL_COMMUNITY): Payer: BC Managed Care – PPO

## 2022-10-21 DIAGNOSIS — K209 Esophagitis, unspecified without bleeding: Secondary | ICD-10-CM | POA: Diagnosis not present

## 2022-10-21 DIAGNOSIS — R112 Nausea with vomiting, unspecified: Secondary | ICD-10-CM | POA: Diagnosis not present

## 2022-10-21 LAB — BASIC METABOLIC PANEL WITH GFR
Anion gap: 11 (ref 5–15)
BUN: 10 mg/dL (ref 6–20)
CO2: 22 mmol/L (ref 22–32)
Calcium: 8.2 mg/dL — ABNORMAL LOW (ref 8.9–10.3)
Chloride: 104 mmol/L (ref 98–111)
Creatinine, Ser: 1.15 mg/dL — ABNORMAL HIGH (ref 0.44–1.00)
GFR, Estimated: >60 mL/min (ref >60)
Glucose, Bld: 91 mg/dL (ref 70–99)
Potassium: 3.3 mmol/L — ABNORMAL LOW (ref 3.5–5.1)
Sodium: 137 mmol/L (ref 135–145)

## 2022-10-21 MED ORDER — PANTOPRAZOLE SODIUM 40 MG PO TBEC: 40.0000 mg | DELAYED_RELEASE_TABLET | Freq: Two times a day (BID) | ORAL | 3 refills | Status: DC

## 2022-10-21 MED ORDER — METOCLOPRAMIDE HCL 10 MG PO TABS: 10.0000 mg | ORAL_TABLET | Freq: Three times a day (TID) | ORAL | Status: DC | PRN

## 2022-10-21 MED ORDER — SUCRALFATE 1 GM/10ML PO SUSP: 1.0000 g | Freq: Three times a day (TID) | ORAL | 0 refills | Status: DC

## 2022-10-21 MED ORDER — PANTOPRAZOLE SODIUM 40 MG PO TBEC
40.0000 mg | DELAYED_RELEASE_TABLET | Freq: Two times a day (BID) | ORAL | Status: DC
  Administered 2022-10-21: 40 mg via ORAL
  Filled 2022-10-21: qty 1

## 2022-10-21 NOTE — Discharge Summary (Addendum)
PATIENT DETAILS Name: Monique Shaw Age: 42 y.o. Sex: female Date of Birth: 1980-05-04 MRN: [id number]. Admitting Physician: Darlin Drop, DO VFI:[id number], Mishi, MD  Admit Date: 10/18/2022 Discharge date: 10/21/2022  Recommendations for Outpatient Follow-up:  Follow up with PCP in 1-2 weeks Please obtain CMP/CBC in one week  Admitted From:  Home  Disposition: Home   Discharge Condition: good  CODE STATUS:   Code Status: Full Code   Diet recommendation:  Diet Order             Diet - low sodium heart healthy           Diet full liquid Fluid consistency: Thin  Diet effective now                    Brief Summary: Patient is a 42 y.o.  female with history of recent hospitalization for intractable nausea/vomiting-presented with another episode of recurrent/intractable nausea/vomiting-evaluated by GI-underwent EGD which showed erosive esophagitis/gastritis-subsequently admitted to the hospitalist service.    Significant events: 8/1>> admit to [facility]   Significant studies: None   Significant microbiology data: None   Procedures: 8/2>> EGD   Consults: GI  Brief Hospital Course: Intractable nausea/vomiting Recurrent issue Felt to be due to LA grade C reflux esophagitis and gastritis S/p EGD 8/2 Tolerating advancement in diet-patient asked to stay on a full liquid/soft diet for the next several days before advancing further.  No nausea vomiting for the past 48 hours. Continue PPI twice daily as recommended by GI Continue Carafate x 4 weeks GI will arrange for repeat EGD in the next 2-3 months for second look/assess healing. EGD biopsy results pending-PCP/GI MD to follow.   Hypokalemia Repleted Recheck at PCPs office   Anxiety/depression Anxious about going home-given recurrent intractable nausea/vomiting and repeated hospitalization Continue Abilify/Wellbutrin/BuSpar/Prozac   Migraine headache Currently stable   Morbid Obesity: Estimated body mass  index is 44.3 kg/m as calculated from the following:   Height as of this encounter: 5\' 9"  (1.753 m).   Weight as of this encounter: 136.1 kg.    Discharge Diagnoses:  Principal Problem:   Intractable nausea and vomiting Active Problems:   Acute esophagitis   Discharge Instructions:  Activity:  As tolerated   Discharge Instructions     Call MD for:  persistant nausea and vomiting   Complete by: As directed    Diet - low sodium heart healthy   Complete by: As directed    Stay on a soft/full liquid diet for next few more days-and gradually advance to a low fiber diet/small portion meals.   Discharge instructions   Complete by: As directed    Follow with Primary MD  Harvie Heck, MD in 1-2 weeks  Your endoscopy biopsy results are pending at the time of discharge-this will need to be followed by gastroenterologist or your primary care practitioner.  Please get a complete blood count and chemistry panel checked by your Primary MD at your next visit, and again as instructed by your Primary MD.  Get Medicines reviewed and adjusted: Please take all your medications with you for your next visit with your Primary MD  Laboratory/radiological data: Please request your Primary MD to go over all hospital tests and procedure/radiological results at the follow up, please ask your Primary MD to get all Hospital records sent to his/her office.  In some cases, they will be blood work, cultures and biopsy results pending at the time of your discharge. Please request that your  primary care M.D. follows up on these results.  Also Note the following: If you experience worsening of your admission symptoms, develop shortness of breath, life threatening emergency, suicidal or homicidal thoughts you must seek medical attention immediately by calling 911 or calling your MD immediately  if symptoms less severe.  You must read complete instructions/literature along with all the possible adverse  reactions/side effects for all the Medicines you take and that have been prescribed to you. Take any new Medicines after you have completely understood and accpet all the possible adverse reactions/side effects.   Do not drive when taking Pain medications or sleeping medications (Benzodaizepines)  Do not take more than prescribed Pain, Sleep and Anxiety Medications. It is not advisable to combine anxiety,sleep and pain medications without talking with your primary care practitioner  Special Instructions: If you have smoked or chewed Tobacco  in the last 2 yrs please stop smoking, stop any regular Alcohol  and or any Recreational drug use.  Wear Seat belts while driving.  Please note: You were cared for by a hospitalist during your hospital stay. Once you are discharged, your primary care physician will handle any further medical issues. Please note that NO REFILLS for any discharge medications will be authorized once you are discharged, as it is imperative that you return to your primary care physician (or establish a relationship with a primary care physician if you do not have one) for your post hospital discharge needs so that they can reassess your need for medications and monitor your lab values.   Increase activity slowly   Complete by: As directed       Allergies as of 10/21/2022   No Known Allergies      Medication List     STOP taking these medications    famotidine 40 MG tablet Commonly known as: PEPCID   RABEprazole 20 MG tablet Commonly known as: ACIPHEX       TAKE these medications    ALPRAZolam 1 MG tablet Commonly known as: XANAX Take 1 mg by mouth daily as needed for sleep.   ARIPiprazole 5 MG tablet Commonly known as: ABILIFY Take 5 mg by mouth at bedtime.   buPROPion 150 MG 24 hr tablet Commonly known as: WELLBUTRIN XL Take 450 mg by mouth daily.   busPIRone 15 MG tablet Commonly known as: BUSPAR Take 15 mg by mouth 2 (two) times daily.   Coenzyme  Q10 100 MG capsule Take 100 mg by mouth daily.   Diethylpropion HCl CR 75 MG Tb24 Take 75 mg by mouth daily.   FLUoxetine 40 MG capsule Commonly known as: PROZAC Take 40 mg by mouth daily.   folic acid 1 MG tablet Commonly known as: FOLVITE Take 1 mg by mouth daily.   hydrOXYzine 25 MG tablet Commonly known as: ATARAX Take 25 mg by mouth daily as needed for itching.   letrozole 2.5 MG tablet Commonly known as: FEMARA Take 5 mg by mouth daily.   magnesium oxide 400 (240 Mg) MG tablet Commonly known as: MAG-OX Take 400 mg by mouth daily.   metoCLOPramide 10 MG tablet Commonly known as: Reglan Take 1 tablet (10 mg total) by mouth every 8 (eight) hours as needed for nausea. What changed:  when to take this reasons to take this   NIFEdipine 30 MG 24 hr tablet Commonly known as: ADALAT CC Take 30 mg by mouth daily.   ondansetron 4 MG disintegrating tablet Commonly known as: ZOFRAN-ODT Take 4 mg by mouth  as needed for nausea or vomiting.   pantoprazole 40 MG tablet Commonly known as: PROTONIX Take 1 tablet (40 mg total) by mouth 2 (two) times daily.   prenatal multivitamin Tabs tablet Take 1 tablet by mouth daily at 12 noon.   Promethegan 25 MG suppository Generic drug: promethazine Place 1 suppository (25 mg total) rectally every 6 (six) hours as needed for refractory nausea / vomiting.   sucralfate 1 GM/10ML suspension Commonly known as: CARAFATE Take 10 mLs (1 g total) by mouth 4 (four) times daily -  with meals and at bedtime for 28 days.   Ubrelvy 100 MG Tabs Generic drug: Ubrogepant Take 100 mg by mouth as needed (migraine).   Vitamin D3 1.25 MG (50000 UT) Caps Take 50,000 Units by mouth once a week. Wednesdays        Follow-up Information     Harvie Heck, MD. Schedule an appointment as soon as possible for a visit in 1 week(s).   Specialty: Family Medicine Contact information: [address] [place] [address] [phone]          [name], MD Follow up.   Specialty: Gastroenterology Why: Hospital follow up, Office will call with date/time, If you dont hear from them,please give them a call Contact information: [address] [place] [address] [phone]                No Known Allergies   Other Procedures/Studies: No results found.   TODAY-DAY OF DISCHARGE:  Subjective:   Monique Shaw today has no headache,no chest abdominal pain,no new weakness tingling or numbness, feels much better wants to go home today.   Objective:   Blood pressure 118/76, pulse 69, temperature 98.1 F (36.7 C), temperature source Oral, resp. rate 14, height 5\' 9"  (1.753 m), weight 136.1 kg, last menstrual period 09/25/2022, SpO2 94%, unknown if currently breastfeeding.  Intake/Output Summary (Last 24 hours) at 10/21/2022 0923 Last data filed at 10/20/2022 1800 Gross per 24 hour  Intake 360 ml  Output --  Net 360 ml   Filed Weights   10/18/22 2009 10/19/22 0827  Weight: 136.1 kg 136.1 kg    Exam: Awake Alert, Oriented *3, No new F.N deficits, Normal affect [place].AT,PERRAL Supple Neck,No JVD, No cervical lymphadenopathy appriciated.  Symmetrical Chest wall movement, Good air movement bilaterally, CTAB RRR,No Gallops,Rubs or new Murmurs, No Parasternal Heave +ve B.Sounds, Abd Soft, Non tender, No organomegaly appriciated, No rebound -guarding or rigidity. No Cyanosis, Clubbing or edema, No new Rash or bruise   PERTINENT RADIOLOGIC STUDIES: No results found.   PERTINENT LAB RESULTS: CBC: Recent Labs    10/19/22 1247 10/20/22 0412  WBC 6.8 5.8  HGB 13.2 12.6  HCT 41.0 37.0  PLT 204 195   CMET CMP     Component Value Date/Time   NA 137 10/21/2022 0400   K 3.3 (L) 10/21/2022 0400   CL 104 10/21/2022 0400   CO2 22 10/21/2022 0400   GLUCOSE 91 10/21/2022 0400   BUN 10 10/21/2022 0400   CREATININE 1.15 (H) 10/21/2022 0400   CREATININE 1.05 07/14/2012 1056   CALCIUM 8.2 (L) 10/21/2022  0400   PROT 5.6 (L) 10/20/2022 0412   ALBUMIN 3.0 (L) 10/20/2022 0412   AST 17 10/20/2022 0412   ALT 14 10/20/2022 0412   ALKPHOS 61 10/20/2022 0412   BILITOT 1.1 10/20/2022 0412   GFRNONAA >60 10/21/2022 0400    GFR Estimated Creatinine Clearance: 93.8 mL/min (A) (by C-G formula  based on SCr of 1.15 mg/dL (H)). Recent Labs    10/18/22 2040  LIPASE 27   No results for input(s): "CKTOTAL", "CKMB", "CKMBINDEX", "TROPONINI" in the last 72 hours. Invalid input(s): "POCBNP" No results for input(s): "DDIMER" in the last 72 hours. No results for input(s): "HGBA1C" in the last 72 hours. No results for input(s): "CHOL", "HDL", "LDLCALC", "TRIG", "CHOLHDL", "LDLDIRECT" in the last 72 hours. No results for input(s): "TSH", "T4TOTAL", "T3FREE", "THYROIDAB" in the last 72 hours.  Invalid input(s): "FREET3" No results for input(s): "VITAMINB12", "FOLATE", "FERRITIN", "TIBC", "IRON", "RETICCTPCT" in the last 72 hours. Coags: No results for input(s): "INR" in the last 72 hours.  Invalid input(s): "PT" Microbiology: No results found for this or any previous visit (from the past 240 hour(s)).  FURTHER DISCHARGE INSTRUCTIONS:  Get Medicines reviewed and adjusted: Please take all your medications with you for your next visit with your Primary MD  Laboratory/radiological data: Please request your Primary MD to go over all hospital tests and procedure/radiological results at the follow up, please ask your Primary MD to get all Hospital records sent to his/her office.  In some cases, they will be blood work, cultures and biopsy results pending at the time of your discharge. Please request that your primary care M.D. goes through all the records of your hospital data and follows up on these results.  Also Note the following: If you experience worsening of your admission symptoms, develop shortness of breath, life threatening emergency, suicidal or homicidal thoughts you must seek medical  attention immediately by calling 911 or calling your MD immediately  if symptoms less severe.  You must read complete instructions/literature along with all the possible adverse reactions/side effects for all the Medicines you take and that have been prescribed to you. Take any new Medicines after you have completely understood and accpet all the possible adverse reactions/side effects.   Do not drive when taking Pain medications or sleeping medications (Benzodaizepines)  Do not take more than prescribed Pain, Sleep and Anxiety Medications. It is not advisable to combine anxiety,sleep and pain medications without talking with your primary care practitioner  Special Instructions: If you have smoked or chewed Tobacco  in the last 2 yrs please stop smoking, stop any regular Alcohol  and or any Recreational drug use.  Wear Seat belts while driving.  Please note: You were cared for by a hospitalist during your hospital stay. Once you are discharged, your primary care physician will handle any further medical issues. Please note that NO REFILLS for any discharge medications will be authorized once you are discharged, as it is imperative that you return to your primary care physician (or establish a relationship with a primary care physician if you do not have one) for your post hospital discharge needs so that they can reassess your need for medications and monitor your lab values.  Total Time spent coordinating discharge including counseling, education and face to face time equals greater than 30 minutes.  SignedJeoffrey Massed 10/21/2022 9:23 AM

## 2022-10-21 NOTE — Plan of Care (Signed)
  Problem: Education: Goal: Knowledge of General Education information will improve Description: Including pain rating scale, medication(s)/side effects and non-pharmacologic comfort measures Outcome: Progressing   Problem: Pain Managment: Goal: General experience of comfort will improve Outcome: Progressing   

## 2022-10-22 ENCOUNTER — Telehealth: Payer: Self-pay

## 2022-10-22 NOTE — Telephone Encounter (Signed)
Called patient per provider request to schedule a follow up visit appointment for esophagitis no answer left message on voice mail to call office back.

## 2023-01-18 ENCOUNTER — Inpatient Hospital Stay (HOSPITAL_COMMUNITY)
Admission: AD | Admit: 2023-01-18 | Discharge: 2023-01-18 | Disposition: A | Discharge status: Home / Self Care | Payer: BC Managed Care – PPO | Attending: Obstetrics & Gynecology | Admitting: Obstetrics & Gynecology

## 2023-01-18 ENCOUNTER — Encounter (HOSPITAL_COMMUNITY): Payer: Self-pay | Admitting: Obstetrics & Gynecology

## 2023-01-18 DIAGNOSIS — O219 Vomiting of pregnancy, unspecified: Secondary | ICD-10-CM | POA: Insufficient documentation

## 2023-01-18 DIAGNOSIS — K219 Gastro-esophageal reflux disease without esophagitis: Secondary | ICD-10-CM | POA: Insufficient documentation

## 2023-01-18 DIAGNOSIS — Z87440 Personal history of urinary (tract) infections: Secondary | ICD-10-CM | POA: Diagnosis not present

## 2023-01-18 DIAGNOSIS — O161 Unspecified maternal hypertension, first trimester: Secondary | ICD-10-CM | POA: Insufficient documentation

## 2023-01-18 DIAGNOSIS — O10919 Unspecified pre-existing hypertension complicating pregnancy, unspecified trimester: Secondary | ICD-10-CM

## 2023-01-18 DIAGNOSIS — Z3A01 Less than 8 weeks gestation of pregnancy: Secondary | ICD-10-CM | POA: Diagnosis not present

## 2023-01-18 DIAGNOSIS — O99611 Diseases of the digestive system complicating pregnancy, first trimester: Secondary | ICD-10-CM | POA: Diagnosis not present

## 2023-01-18 LAB — URINALYSIS, ROUTINE W REFLEX MICROSCOPIC
Bacteria, UA: NONE SEEN
Bilirubin Urine: NEGATIVE
Glucose, UA: 50 mg/dL — AB
Hgb urine dipstick: NEGATIVE
Ketones, ur: 80 mg/dL — AB
Leukocytes,Ua: NEGATIVE
Nitrite: NEGATIVE
Protein, ur: 30 mg/dL — AB
Specific Gravity, Urine: 1.027 (ref 1.005–1.030)
pH: 6 (ref 5.0–8.0)

## 2023-01-18 MED ORDER — SCOPOLAMINE 1 MG/3DAYS TD PT72
1.0000 | MEDICATED_PATCH | TRANSDERMAL | Status: DC
  Administered 2023-01-18: 1.5 mg via TRANSDERMAL
  Filled 2023-01-18: qty 1

## 2023-01-18 MED ORDER — SCOPOLAMINE 1 MG/3DAYS TD PT72: 1.0000 | MEDICATED_PATCH | TRANSDERMAL | 0 refills | Status: AC

## 2023-01-18 MED ORDER — LACTATED RINGERS IV BOLUS
1000.0000 mL | Freq: Once | INTRAVENOUS | Status: AC
  Administered 2023-01-18: 1000 mL via INTRAVENOUS

## 2023-01-18 MED ORDER — METOCLOPRAMIDE HCL 5 MG/ML IJ SOLN
10.0000 mg | Freq: Once | INTRAMUSCULAR | Status: AC
  Administered 2023-01-18: 10 mg via INTRAVENOUS
  Filled 2023-01-18: qty 2

## 2023-01-18 MED ORDER — PANTOPRAZOLE SODIUM 40 MG IV SOLR
40.0000 mg | Freq: Once | INTRAVENOUS | Status: AC
  Administered 2023-01-18: 40 mg via INTRAVENOUS
  Filled 2023-01-18: qty 10

## 2023-01-18 MED ORDER — ONDANSETRON HCL 4 MG/2ML IJ SOLN
4.0000 mg | Freq: Four times a day (QID) | INTRAMUSCULAR | Status: DC | PRN
  Administered 2023-01-18: 4 mg via INTRAVENOUS
  Filled 2023-01-18: qty 2

## 2023-01-18 MED ORDER — NIFEDIPINE ER OSMOTIC RELEASE 30 MG PO TB24
30.0000 mg | ORAL_TABLET | Freq: Once | ORAL | Status: AC
  Administered 2023-01-18: 30 mg via ORAL
  Filled 2023-01-18: qty 1

## 2023-01-18 NOTE — MAU Note (Signed)
Pt up walking. She states she feels better.

## 2023-01-18 NOTE — MAU Note (Signed)
Pt called out and would like medication for nausea. Dr Leanora Cover aware and Zofran ordered. Pt aware and agrees with plan

## 2023-01-18 NOTE — MAU Provider Note (Signed)
Chief Complaint: Urinary Tract Infection, Nausea, and Hypertension  SUBJECTIVE HPI: Monique Shaw is a 42 y.o. [id number] at [redacted]w[redacted]d by LMP who presents to maternity admissions reporting nausea and vomiting for 36 hours. No sick contacts. In first trimester. No abdominal pain. No reflux.  She denies vaginal bleeding, vaginal itching/burning, urinary symptoms, h/a, dizziness, n/v, or fever/chills.    Of note patient was seen at [facility] 10/31 and thought to have UTI, hypertension and hyperemesis. She was prescribed amoxicillin which she has been unable to keep down.   Endorses a history of reflux well controlled with Aciphex (rabeprazole) which she has not taken for several days due to uncertainty it was safe during pregnancy. Reassurance provided that it is.   HPI  Past Medical History:  Diagnosis Date   Allergy    Anxiety    ASCVD (arteriosclerotic cardiovascular disease) 2011   Asthma    Blood type, Rh negative    Depression    GERD (gastroesophageal reflux disease)    H/O migraine 10/25/2010   H/O toxoplasmosis    H/O varicella    pt denies    History of elevated lipids 2011   Hx: UTI (urinary tract infection) 2007   Hypertension    IBS (irritable bowel syndrome)    Irregular menstrual cycle 2004   Migraine    Nausea & vomiting 04/27/2021   Obese    Sleep apnea    Weight gain 2004   Past Surgical History:  Procedure Laterality Date   BIOPSY  05/04/2021   Procedure: BIOPSY;  Surgeon: Kathi Der, MD;  Location: [facility] ENDOSCOPY;  Service: Gastroenterology;;   BIOPSY  10/19/2022   Procedure: BIOPSY;  Surgeon: [name], MD;  Location: [facility] ENDOSCOPY;  Service: Gastroenterology;;   ESOPHAGOGASTRODUODENOSCOPY N/A 05/04/2021   Procedure: ESOPHAGOGASTRODUODENOSCOPY (EGD);  Surgeon: Kathi Der, MD;  Location: [facility] END[facility]COPY;  Service: Gastroenterology;  Laterality: N/A;   ESOPHAGOGASTRODUODENOSCOPY (EGD) WITH PROPOFOL N/A 10/19/2022   Procedure:  ESOPHAGOGASTRODUODENOSCOPY (EGD) WITH PROPOFOL;  Surgeon: [name], MD;  Location: [facility] ENDOSCOPY;  Service: Gastroenterology;  Laterality: N/A;   gastroenteritis     LAPAROSCOPIC BILATERAL SALPINGECTOMY Left 11/18/2021   Procedure: LAPAROSCOPIC LEFT SALPINGECTOMY WITH REMOVAL OF ECTOPIC PREGNANCY;  Surgeon: Gerald Leitz, MD;  Location: [facility] OR;  Service: Gynecology;  Laterality: Left;   WISDOM TOOTH EXTRACTION     Social History   Socioeconomic History   Marital status: Married    Spouse name: Molly Maduro   Number of children: 1   Years of education: M. ED   Highest education level: Not on file  Occupational History    Employer: [employer]    Comment: Teacher  Tobacco Use   Smoking status: Never   Smokeless tobacco: Never  Substance and Sexual Activity   Alcohol use: No   Drug use: No   Sexual activity: Yes    Birth control/protection: None    Comment: ALESSE  Other Topics Concern   Not on file  Social History Narrative   Pt lives at home with husband Molly Maduro) and son   Pt is right handed    Education- Masters   Caffeine- occasional soda or tea but not every day   Social Determinants of Health   Financial Resource Strain: Medium Risk (09/12/2022)   Received from [facility]   Overall Financial Resource Strain (CARDIA)    Difficulty of Paying Living Expenses: Somewhat hard  Food Insecurity: Low Risk  (11/30/2022)   Received from [facility]  Hunger Vital Sign    Worried About Running Out of Food in the Last Year: Never true    Ran Out of Food in the Last Year: Never true  Recent Concern: Food Insecurity - Medium Risk (10/30/2022)   Received from [facility]   Food vital sign    Within the past 12 months, you worried that your food would run out before you got money to buy more: Sometimes true    Within the past 12 months, the food you bought just didn't last and you didn't have money to get more. : Sometimes true  Transportation Needs: No  Transportation Needs (11/30/2022)   Received from [facility]    In the past 12 months, has lack of reliable transportation kept you from medical appointments, meetings, work or from getting things needed for daily living? : No  Physical Activity: Insufficiently Active (09/12/2022)   Received [facility]   Exercise Vital Sign    Days of Exercise per Week: 4 days    Minutes of Exercise per Session: 30 min  Stress: No Stress Concern Present (10/07/2022)   Received from [facility] - Occupational Stress Questionnaire    Feeling of Stress : Only a little  Recent Concern: Stress - Stress Concern Present (09/12/2022)   Received from [facility] - Occupational Stress Questionnaire    Feeling of Stress : To some extent  Social Connections: Somewhat Isolated (09/12/2022)   Received from [facility]   Social Network    How would you rate your social network (family, work, friends)?: Restricted participation with some degree of social isolation  Intimate Partner Violence: Not At Risk (01/17/2023)   Received from [facility]   HITS    Over the last 12 months how often did your partner physically hurt you?: 1    Over the last 12 months how often did your partner insult you or talk down to you?: 1    Over the last 12 months how often did your partner threaten you with physical harm?: 1    Over the last 12 months how often did your partner scream or curse at you?: 1   No current facility-administered medications on file prior to encounter.   Current Outpatient Medications on File Prior to Encounter  Medication Sig Dispense Refill   ondansetron (ZOFRAN-ODT) 4 MG disintegrating tablet Take 4 mg by mouth as needed for nausea or vomiting.     Prenatal Vit-Fe Fumarate-FA (PRENATAL MULTIVITAMIN) TABS tablet Take 1 tablet by mouth daily at 12 noon.     ALPRAZolam (XANAX) 1 MG tablet Take 1  mg by mouth daily as needed for sleep. (Patient not taking: Reported on 01/18/2023)     ARIPiprazole (ABILIFY) 5 MG tablet Take 5 mg by mouth at bedtime. (Patient not taking: Reported on 01/18/2023)     buPROPion (WELLBUTRIN XL) 150 MG 24 hr tablet Take 450 mg by mouth daily. (Patient not taking: Reported on 01/18/2023)     busPIRone (BUSPAR) 15 MG tablet Take 15 mg by mouth 2 (two) times daily. (Patient not taking: Reported on 01/18/2023)     Cholecalciferol (VITAMIN D3) 1.25 MG (50000 UT) CAPS Take 50,000 Units by mouth once a week. Wednesdays (Patient not taking: Reported on 01/18/2023)     Coenzyme Q10 100 MG capsule Take 100 mg by mouth daily. (Patient not taking: Reported on 01/18/2023)     Diethylpropion  HCl CR 75 MG TB24 Take 75 mg by mouth daily. (Patient not taking: Reported on 01/18/2023)     FLUoxetine (PROZAC) 40 MG capsule Take 40 mg by mouth daily. (Patient not taking: Reported on 01/18/2023)     folic acid (FOLVITE) 1 MG tablet Take 1 mg by mouth daily.     hydrOXYzine (ATARAX) 25 MG tablet Take 25 mg by mouth daily as needed for itching. (Patient not taking: Reported on 01/18/2023)     letrozole (FEMARA) 2.5 MG tablet Take 5 mg by mouth daily. (Patient not taking: Reported on 01/18/2023)     magnesium oxide (MAG-OX) 400 (240 Mg) MG tablet Take 400 mg by mouth daily. (Patient not taking: Reported on 01/18/2023)     metoCLOPramide (REGLAN) 10 MG tablet Take 1 tablet (10 mg total) by mouth every 8 (eight) hours as needed for nausea. (Patient not taking: Reported on 01/18/2023)     NIFEdipine (ADALAT CC) 30 MG 24 hr tablet Take 30 mg by mouth daily. (Patient not taking: Reported on 01/18/2023)     pantoprazole (PROTONIX) 40 MG tablet Take 1 tablet (40 mg total) by mouth 2 (two) times daily. (Patient not taking: Reported on 01/18/2023) 60 tablet 3   PROMETHEGAN 25 MG suppository Place 1 suppository (25 mg total) rectally every 6 (six) hours as needed for refractory nausea / vomiting. (Patient taking  differently: Place 25 mg rectally every 6 (six) hours as needed for refractory nausea / vomiting. Pt does not have any currently.) 12 each 0   sucralfate (CARAFATE) 1 GM/10ML suspension Take 10 mLs (1 g total) by mouth 4 (four) times daily -  with meals and at bedtime for 28 days. (Patient not taking: Reported on 01/18/2023) 1120 mL 0   UBRELVY 100 MG TABS Take 100 mg by mouth as needed (migraine). (Patient not taking: Reported on 01/18/2023)     No Known Allergies  I have reviewed patient's Past Medical Hx, Surgical Hx, Family Hx, Social Hx, medications and allergies.   ROS:  Review of Systems Review of Systems  Other systems negative   Physical Exam  Physical Exam Patient Vitals for the past 24 hrs:  BP Temp Pulse Resp SpO2 Height Weight  01/18/23 0407 132/84 -- 70 -- -- -- --  01/18/23 0324 (!) 170/101 -- -- -- -- -- --  01/18/23 0319 -- 97.9 F (36.6 C) 67 18 100 % 5\' 9"  (1.753 m) (!) 138.3 kg   Constitutional: Well-developed, well-nourished female in no acute distress.  Cardiovascular: normal rate Respiratory: normal effort GI: Abd soft, non-tender. Pos BS x 4 MS: Extremities nontender, no edema, normal ROM Neurologic: Alert and oriented x 4.  GU: Neg CVAT.  LAB RESULTS Results for orders placed or performed during the hospital encounter of 01/18/23 (from the past 24 hour(s))  Urinalysis, Routine w reflex microscopic -Urine, Clean Catch     Status: Abnormal   Collection Time: 01/18/23  3:30 AM  Result Value Ref Range   Color, Urine YELLOW YELLOW   APPearance HAZY (A) CLEAR   Specific Gravity, Urine 1.027 1.005 - 1.030   pH 6.0 5.0 - 8.0   Glucose, UA 50 (A) NEGATIVE mg/dL   Hgb urine dipstick NEGATIVE NEGATIVE   Bilirubin Urine NEGATIVE NEGATIVE   Ketones, ur 80 (A) NEGATIVE mg/dL   Protein, ur 30 (A) NEGATIVE mg/dL   Nitrite NEGATIVE NEGATIVE   Leukocytes,Ua NEGATIVE NEGATIVE   RBC / HPF 0-5 0 - 5 RBC/hpf   WBC, UA 0-5 0 -  5 WBC/hpf   Bacteria, UA NONE SEEN NONE  SEEN   Squamous Epithelial / HPF 6-10 0 - 5 /HPF   Mucus PRESENT      IMAGING No results found. US Ob < 14 Wks Transabdominal/Transvaginal  Anatomical Region Laterality Modality  Pelvis -- Ultrasound   Impression  IMPRESSION:  0.8 cm cystic structure in the endometrium which does not contain a yolk sac or fetal pole. If this is a gestational sac it would correspond to an estimated fetal age of 5 weeks 4 days and this could represent early pregnancy. Follow-up ultrasound is recommended.  Unremarkable right ovary.  Nonvisualization of the left ovary.  Electronically Signed by: Azzie Almas, MD on 01/17/2023 6:29 PM Narrative  Pelvic ultrasound.  HISTORY: Nausea.  Real-time grayscale and color Doppler transabdominal and transvaginal imaging of the pelvis was performed.  The uterus measures 11.1 x 6.0 x 7.0 cm.  There is a cystic structure within the endometrium measuring 0.8 cm. There is no evidence of yolk sac or fetal pole. If this is a gestational sac it would correspond to a fetal age of approximately 5 weeks 4 days.  The right ovary measures 3.2 x 2.6 x 2.5 cm.  The left ovary could not be definitively identified.  There is a small amount of fluid in the posterior cul-de-sac which could be physiological.  OTHER ENDOCRINE  [facility] Component 01/17/2023       HCG,POC -- Load older lab results  B HCG Quant 8,508    cephALEXin (KEFLEX) 500 mg capsule  Take one capsule (500 mg dose) by mouth 4 (four) times daily for 7 days. 28 capsule  0 01/17/2023 01/24/2023     MAU Management/MDM: I have reviewed the triage vital signs and the nursing notes.   Pertinent labs & imaging results that were available during my care of the patient were reviewed by me and considered in my medical decision making (see chart for details).      I have reviewed her medical records including past results, notes and treatments. Medical, Surgical, and family history were  reviewed.  Medications and recent lab tests were reviewed  Ordered usual first trimester r/o ectopic labs.   Pelvic exam and cultures done Will check baseline Ultrasound to rule out ectopic.  Treatments in MAU included Procardia, 1L LR bolus, Protonix, Reglan, and Scopolamine patch.   This bleeding/pain can represent a normal pregnancy with bleeding, spontaneous abortion or even an ectopic which can be life-threatening.  The process as listed above helps to determine which of these is present.  ASSESSMENT 1. [redacted] weeks gestation of pregnancy   2. Nausea and vomiting during pregnancy prior to [redacted] weeks gestation   3. Chronic hypertension affecting pregnancy    Today's urinalysis does not indication UTI - recommend discontinuation of amoxicillin.   N/V improved.  - restart home Rabeprazole daily - Rx sent for scopolamine - Rx sent for reglan - encouraged small frequent, light meals and layered medications  HTN - improved with home dose of Procardia - encouraged to restart home medication as previously prescribed  PLAN Discharge home Continue prenatal care as scheduled Return precautions provided   Follow-up Information     Ob/Gyn, [facility] Follow up.   Why: As scheduled for prenatal care Contact information: [address] [place] [address] [phone]                Pt stable at time of discharge. Encouraged to return here  if she develops worsening of symptoms, increase in pain, fever, or other concerning symptoms.   Wyn Forster, MD FMOB Fellow, Faculty practice [facility], Center for [facility]  01/18/2023  6:01 AM

## 2023-01-18 NOTE — Progress Notes (Signed)
Written and verbal d/c instructions given and understanding voiced. 

## 2023-01-18 NOTE — MAU Note (Signed)
Pt vomited about after taking Procardia

## 2023-01-18 NOTE — MAU Note (Addendum)
.  Monique Shaw is a 42 y.o. at [redacted]w[redacted]d here in MAU reporting n/v for 24hrs. Was seen Thurs night at [place] and given Zofran, Promethazine and IV flds. Given antibiotic for uti but unable to keep it down.Just got home about 2130 from [place].  Denies vag bleeding. Pt has CHTN and on Labatalol but has not taken in about 2wks LMP: 12/13/22 Onset of complaint: 2130 Pain score: 0 Vitals:   01/18/23 0319 01/18/23 0324  BP:  (!) 170/101  Pulse: 67   Resp: 18   Temp: 97.9 F (36.6 C)   SpO2: 100%      FHT:n/a Lab orders placed from triage:  u/a

## 2023-03-30 ENCOUNTER — Inpatient Hospital Stay (HOSPITAL_COMMUNITY)
Admission: AD | Admit: 2023-03-30 | Discharge: 2023-03-30 | Disposition: A | Discharge status: Home / Self Care | Payer: 59 | Attending: Obstetrics and Gynecology | Admitting: Obstetrics and Gynecology

## 2023-03-30 ENCOUNTER — Encounter (HOSPITAL_COMMUNITY): Payer: Self-pay | Admitting: Obstetrics and Gynecology

## 2023-03-30 DIAGNOSIS — O10912 Unspecified pre-existing hypertension complicating pregnancy, second trimester: Secondary | ICD-10-CM | POA: Insufficient documentation

## 2023-03-30 DIAGNOSIS — O99612 Diseases of the digestive system complicating pregnancy, second trimester: Secondary | ICD-10-CM | POA: Insufficient documentation

## 2023-03-30 DIAGNOSIS — O09292 Supervision of pregnancy with other poor reproductive or obstetric history, second trimester: Secondary | ICD-10-CM | POA: Diagnosis not present

## 2023-03-30 DIAGNOSIS — O99512 Diseases of the respiratory system complicating pregnancy, second trimester: Secondary | ICD-10-CM | POA: Insufficient documentation

## 2023-03-30 DIAGNOSIS — O99342 Other mental disorders complicating pregnancy, second trimester: Secondary | ICD-10-CM | POA: Diagnosis not present

## 2023-03-30 DIAGNOSIS — G43009 Migraine without aura, not intractable, without status migrainosus: Secondary | ICD-10-CM

## 2023-03-30 DIAGNOSIS — O99352 Diseases of the nervous system complicating pregnancy, second trimester: Secondary | ICD-10-CM | POA: Diagnosis not present

## 2023-03-30 DIAGNOSIS — Z3A15 15 weeks gestation of pregnancy: Secondary | ICD-10-CM | POA: Insufficient documentation

## 2023-03-30 DIAGNOSIS — O99212 Obesity complicating pregnancy, second trimester: Secondary | ICD-10-CM | POA: Diagnosis not present

## 2023-03-30 DIAGNOSIS — G44209 Tension-type headache, unspecified, not intractable: Secondary | ICD-10-CM | POA: Diagnosis not present

## 2023-03-30 DIAGNOSIS — O09522 Supervision of elderly multigravida, second trimester: Secondary | ICD-10-CM | POA: Diagnosis not present

## 2023-03-30 DIAGNOSIS — R030 Elevated blood-pressure reading, without diagnosis of hypertension: Secondary | ICD-10-CM | POA: Diagnosis not present

## 2023-03-30 DIAGNOSIS — O10919 Unspecified pre-existing hypertension complicating pregnancy, unspecified trimester: Secondary | ICD-10-CM

## 2023-03-30 MED ORDER — METOCLOPRAMIDE HCL 10 MG PO TABS: 10.0000 mg | ORAL_TABLET | Freq: Three times a day (TID) | ORAL | 1 refills | Status: AC

## 2023-03-30 MED ORDER — DIPHENHYDRAMINE HCL 25 MG PO CAPS: 25.0000 mg | ORAL_CAPSULE | Freq: Four times a day (QID) | ORAL | 1 refills | Status: AC | PRN

## 2023-03-30 MED ORDER — CYCLOBENZAPRINE HCL 10 MG PO TABS: 10.0000 mg | ORAL_TABLET | Freq: Three times a day (TID) | ORAL | 1 refills | Status: DC | PRN

## 2023-03-30 NOTE — Discharge Instructions (Signed)
 For headaches: - Take tylenol 1 g, benadryl 25-50 mg, reglan 10 mg every 8 hours as needed - Flexeril last line. This can make you sleepy. Don't take before driving - Heat, ice, sleep are all fair game and can be helpful

## 2023-03-30 NOTE — MAU Provider Note (Signed)
 Chief Complaint: Hypertension and Headache   Event Date/Time   First Provider Initiated Contact with Patient 03/30/23 1422      SUBJECTIVE HPI: Monique Shaw is a 43 y.o. G4P1021 at [redacted]w[redacted]d by LMP who presents to maternity admissions reporting HBP and HA. Pregnancy c/b AMA, cHTN, hx migraines. Receives care with [facility].  Patient presents today for severe range pressures at home and mild headache. Patient with known cHTN. Has been on Procardia  30 mg for ~1 year. Increased to 30 mg BID by MFM 1/9. Notes she did check her home BP cuff against office cuff for accuracy. Patient notes frontal headache (throbbing in nature) frequently during her pregnancy. Does have a hx of migraines, however this is not the same as her usual migraines and does not have an aura. Tylenol  is somewhat helpful. 3/10 currently. Denies fever/chills, VB, LOF, cramping, vision changes, CP, SOB, weakness.  HPI  Past Medical History:  Diagnosis Date   Allergy    Anxiety    ASCVD (arteriosclerotic cardiovascular disease) 2011   Asthma    Blood type, Rh negative    Depression    GERD (gastroesophageal reflux disease)    H/O migraine 10/25/2010   H/O toxoplasmosis    H/O varicella    pt denies    History of elevated lipids 2011   Hx: UTI (urinary tract infection) 2007   Hypertension    IBS (irritable bowel syndrome)    Irregular menstrual cycle 2004   Migraine    Nausea & vomiting 04/27/2021   Obese    Sleep apnea    Weight gain 2004   Past Surgical History:  Procedure Laterality Date   BIOPSY  05/04/2021   Procedure: BIOPSY;  Surgeon: Elicia Claw, MD;  Location: [facility]COPY;  Service: Gastroenterology;;   BIOPSY  10/19/2022   Procedure: BIOPSY;  Surgeon: Federico Rosario BROCKS, MD;  Location: [facility] ENDOSCOPY;  Service: Gastroenterology;;   ESOPHAGOGASTRODUODENOSCOPY N/A 05/04/2021   Procedure: ESOPHAGOGASTRODUODENOSCOPY (EGD);  Surgeon: Elicia Claw, MD;  Location: [facility];  Service: Gastroenterology;   Laterality: N/A;   ESOPHAGOGASTRODUODENOSCOPY (EGD) WITH PROPOFOL  N/A 10/19/2022   Procedure: ESOPHAGOGASTRODUODENOSCOPY (EGD) WITH PROPOFOL ;  Surgeon: Federico Rosario BROCKS, MD;  Location: [facility] ENDOSCOPY;  Service: Gastroenterology;  Laterality: N/A;   gastroenteritis     LAPAROSCOPIC BILATERAL SALPINGECTOMY Left 11/18/2021   Procedure: LAPAROSCOPIC LEFT SALPINGECTOMY WITH REMOVAL OF ECTOPIC PREGNANCY;  Surgeon: Rosalva Sawyer, MD;  Location: [facility] OR;  Service: Gynecology;  Laterality: Left;   WISDOM TOOTH EXTRACTION     Social History   Socioeconomic History   Marital status: Married    Spouse name: Lamar   Number of children: 1   Years of education: M. ED   Highest education level: Not on file  Occupational History    Employer: [employer]    Comment: Teacher  Tobacco Use   Smoking status: Never   Smokeless tobacco: Never  Substance and Sexual Activity   Alcohol use: No   Drug use: No   Sexual activity: Yes    Birth control/protection: None    Comment: ALESSE  Other Topics Concern   Not on file  Social History Narrative   Pt lives at home with husband Beverlie) and son   Pt is right handed    Education- Masters   Caffeine- occasional soda or tea but not every day   Social Drivers of Health   Financial Resource Strain: Medium Risk (09/12/2022)   Received from [facility]   Overall Financial  Resource Strain (CARDIA)    Difficulty of Paying Living Expenses: Somewhat hard  Food Insecurity: Low Risk  (01/21/2023)   Received from [facility]   Hunger Vital Sign    Worried About Running Out of Food in the Last Year: Never true    Ran Out of Food in the Last Year: Never true  Recent Concern: Food Insecurity - Medium Risk (10/30/2022)   Received from [facility]   Food vital sign    Within the past 12 months, you worried that your food would run out before you got money to buy more: Sometimes true    Within the past 12 months, the food you bought just didn't last  and you didn't have money to get more. : Sometimes true  Transportation Needs: No Transportation Needs (01/21/2023)   Received from [facility]    In the past 12 months, has lack of reliable transportation kept you from medical appointments, meetings, work or from getting things needed for daily living? : No  Physical Activity: Insufficiently Active (09/12/2022)   Received from [facility]   Exercise Vital Sign    Days of Exercise per Week: 4 days    Minutes of Exercise per Session: 30 min  Stress: No Stress Concern Present (10/07/2022)   Received from [facility] - Occupational Stress Questionnaire    Feeling of Stress : Only a little  Recent Concern: Stress - Stress Concern Present (09/12/2022)   Received from [facility] - Occupational Stress Questionnaire    Feeling of Stress : To some extent  Social Connections: Somewhat Isolated (09/12/2022)   Received from [facility]   Social Network    How would you rate your social network (family, work, friends)?: Restricted participation with some degree of social isolation  Intimate Partner Violence: Not At Risk (01/17/2023)   Received from [facility]   HITS    Over the last 12 months how often did your partner physically hurt you?: Never    Over the last 12 months how often did your partner insult you or talk down to you?: Never    Over the last 12 months how often did your partner threaten you with physical harm?: Never    Over the last 12 months how often did your partner scream or curse at you?: Never   No current facility-administered medications on file prior to encounter.   Current Outpatient Medications on File Prior to Encounter  Medication Sig Dispense Refill   folic acid  (FOLVITE ) 1 MG tablet Take 1 mg by mouth daily.     NIFEdipine  (ADALAT  CC) 30 MG 24 hr tablet Take 30 mg by mouth daily. (Patient not taking:  Reported on 01/18/2023)     ondansetron  (ZOFRAN -ODT) 4 MG disintegrating tablet Take 4 mg by mouth as needed for nausea or vomiting.     Prenatal Vit-Fe Fumarate-FA (PRENATAL MULTIVITAMIN) TABS tablet Take 1 tablet by mouth daily at 12 noon.     scopolamine  (TRANSDERM-SCOP) 1 MG/3DAYS Place 1 patch (1.5 mg total) onto the skin every 3 (three) days. 30 patch 0   No Known Allergies  ROS:  Pertinent positives/negatives listed above.  I have reviewed patient's Past Medical Hx, Surgical Hx, Family Hx, Social Hx, medications and allergies.   Physical Exam  Patient Vitals for the past 24 hrs:  BP Temp Temp src Pulse Resp SpO2 Height Weight  03/30/23 1436 108/78 -- --  82 -- -- -- --  03/30/23 1352 129/80 -- -- 97 -- -- -- --  03/30/23 1348 124/70 98.5 F (36.9 C) Oral (!) 104 16 100 % 5' 9 (1.753 m) (!) 145.6 kg   Constitutional: Well-developed, well-nourished female in no acute distress.  Cardiovascular: normal rate Respiratory: normal effort GI: Abd soft, non-tender. Pos BS x 4 MS: Extremities nontender, no edema, normal ROM Neurologic: Alert and oriented x 4. No focal abnormalities. Logical and linear though process  Doppler: 149  LAB RESULTS No results found for this or any previous visit (from the past 24 hours).    IMAGING No results found.  MAU Management/MDM: Orders Placed This Encounter  Procedures   Discharge patient    Meds ordered this encounter  Medications   diphenhydrAMINE  (BENADRYL ) 25 mg capsule    Sig: Take 1-2 capsules (25-50 mg total) by mouth every 6 (six) hours as needed (Headache).    Dispense:  90 capsule    Refill:  1   metoCLOPramide  (REGLAN ) 10 MG tablet    Sig: Take 1 tablet (10 mg total) by mouth 3 (three) times daily with meals.    Dispense:  90 tablet    Refill:  1   cyclobenzaprine  (FLEXERIL ) 10 MG tablet    Sig: Take 1 tablet (10 mg total) by mouth 3 (three) times daily as needed for muscle spasms.    Dispense:  30 tablet    Refill:  1     Patient presents for elevated BP and HA in setting of cHTN and 15.[redacted] weeks gestation. Followed closely by [facility], and I have reviewed those notes and recent labs/results.  #cHTN: Normotensive upon arrival. Discussed that as she just increased Procardia  dose two days ago, and that it is still likely taking full effect. She does not have any signs of hypertensive urgency/emergency either to warrant urgent work-up. To continue taking Procardia  30 mg BID. Extensive discussion on severe range pressures, how HTN can affect babe, education on preE. Has follow-up appointment next week with MFM. Encouraged her to keep this.  #HA: Inconsistent with her previous migraines. No red flag symptoms to warrant head imaging at this time. Discussion around increased estrogen in early pregnancy causing an increase in headaches along with known comorbidities of the pregnancy. Patient has to drive herself home so was unable to give full migraine cocktail. Discussed tylenol , benadryl , and reglan  for headaches at home. Flexeril  last line and to not take this before driving especially. Encouraged hydration, heat/ice, rest for conservative measures as well. She will follow-up with MFM as well for this issue.  FWB: appropriate dopplers.  ASSESSMENT 1. Chronic hypertension during pregnancy   2. Acute non intractable tension-type headache   3. Migraine without aura and without status migrainosus, not intractable   4. [redacted] weeks gestation of pregnancy     PLAN Discharge home with strict return precautions. Allergies as of 03/30/2023   No Known Allergies      Medication List     STOP taking these medications    Coenzyme Q10 100 MG capsule   FLUoxetine  40 MG capsule Commonly known as: PROZAC    hydrOXYzine  25 MG tablet Commonly known as: ATARAX    magnesium oxide 400 (240 Mg) MG tablet Commonly known as: MAG-OX   pantoprazole  40 MG tablet Commonly known as: PROTONIX    Promethegan  25 MG suppository Generic  drug: promethazine    sucralfate  1 GM/10ML suspension Commonly known as: CARAFATE    Vitamin D3 1.25 MG (50000 UT) Caps  TAKE these medications    cyclobenzaprine  10 MG tablet Commonly known as: FLEXERIL  Take 1 tablet (10 mg total) by mouth 3 (three) times daily as needed for muscle spasms.   diphenhydrAMINE  25 mg capsule Commonly known as: BENADRYL  Take 1-2 capsules (25-50 mg total) by mouth every 6 (six) hours as needed (Headache).   folic acid  1 MG tablet Commonly known as: FOLVITE  Take 1 mg by mouth daily.   metoCLOPramide  10 MG tablet Commonly known as: REGLAN  Take 1 tablet (10 mg total) by mouth 3 (three) times daily with meals. What changed:  when to take this reasons to take this   NIFEdipine  30 MG 24 hr tablet Commonly known as: ADALAT  CC Take 30 mg by mouth daily.   ondansetron  4 MG disintegrating tablet Commonly known as: ZOFRAN -ODT Take 4 mg by mouth as needed for nausea or vomiting.   prenatal multivitamin Tabs tablet Take 1 tablet by mouth daily at 12 noon.   scopolamine  1 MG/3DAYS Commonly known as: TRANSDERM-SCOP Place 1 patch (1.5 mg total) onto the skin every 3 (three) days.         Almarie Moats, MD OB Fellow 03/30/2023  2:59 PM

## 2023-03-30 NOTE — MAU Note (Addendum)
.  Monique Shaw is a 43 y.o. at [redacted]w[redacted]d here in MAU reporting: Elevated BP's and headaches since January 1st. She reports she has been takign Tylenol  around twice a day with relief but reports her HA's always return. She reports her HA's are always on her right side. She reports she was taking migraine medications prior to pregnancy but these HA's feel different. She reports her BP's today were 162/93 and 154/94. Denies visual disturbances, RUQ/epigastric pain, and edema. Left eye swollen and itchy.   -Last dose of Tylenol  this morning at 1130.  -CHTN. Procardia  60 XL daily. Dumping syndrome. AMA. -Hx migraines. Was taking 5 mg Lisinopril  daily prior to pregnancy. Scheduled for a BP check this upcoming Thursday.  OB Visit on 1/9 with [facility]. Note states: She currently takes 30mg  procardia  XL daily(for the last year); prior to this she took 5mg  lisinopril  daily.  - She has recently initiated 81mg  ASA daily as well.  - She reports headaches and elevated blood pressure at home since January 1. Her blood pressure this morning was 172/98 per report. She brought her blood pressure cuff to today's visit with a blood pressure reading of 145/80 on her cuff and 144/82 on our cuff. We discussed that the blood pressure goals in pregnancy are less than 140/90 and that titration of her antihypertensive regimen is needed. We discussed the risk of uncontrolled chronic hypertension in pregnancy.  Plan -Plan to increase her Procardia  XL to 60 mg daily.  -Continue to check blood pressures at home and call office with any severe range pressures. -Follow-up blood pressure check in 1 week scheduled today.  Onset of complaint: January 1st Pain score: 3/10 HA  Vitals:   03/30/23 1348 03/30/23 1352  BP: 124/70 129/80  Pulse: (!) 104 97  Resp: 16   Temp: 98.5 F (36.9 C)   SpO2: 100%       FHT: 149 doppler Lab orders placed from triage: none

## 2023-08-16 ENCOUNTER — Ambulatory Visit: Payer: BC Managed Care – PPO | Admitting: Family Medicine

## 2023-08-16 ENCOUNTER — Encounter: Payer: Self-pay | Admitting: Family Medicine

## 2023-08-16 ENCOUNTER — Ambulatory Visit: Admitting: Family Medicine

## 2023-08-16 VITALS — BP 122/84 | HR 101 | Temp 98.4°F | Ht 69.0 in | Wt 318.8 lb

## 2023-08-16 DIAGNOSIS — Z3A35 35 weeks gestation of pregnancy: Secondary | ICD-10-CM

## 2023-08-16 DIAGNOSIS — Z7689 Persons encountering health services in other specified circumstances: Secondary | ICD-10-CM

## 2023-08-16 DIAGNOSIS — O24414 Gestational diabetes mellitus in pregnancy, insulin controlled: Secondary | ICD-10-CM | POA: Diagnosis not present

## 2023-08-16 DIAGNOSIS — Z91010 Allergy to peanuts: Secondary | ICD-10-CM | POA: Insufficient documentation

## 2023-08-16 DIAGNOSIS — I10 Essential (primary) hypertension: Secondary | ICD-10-CM

## 2023-08-16 DIAGNOSIS — K449 Diaphragmatic hernia without obstruction or gangrene: Secondary | ICD-10-CM | POA: Insufficient documentation

## 2023-08-16 DIAGNOSIS — Z8669 Personal history of other diseases of the nervous system and sense organs: Secondary | ICD-10-CM | POA: Insufficient documentation

## 2023-08-16 DIAGNOSIS — K911 Postgastric surgery syndromes: Secondary | ICD-10-CM | POA: Insufficient documentation

## 2023-08-16 DIAGNOSIS — K219 Gastro-esophageal reflux disease without esophagitis: Secondary | ICD-10-CM | POA: Diagnosis not present

## 2023-08-16 NOTE — Progress Notes (Signed)
 Established Patient Office Visit   Subjective  Patient ID: Monique Shaw, female    DOB: Aug 18, 1980  Age: 43 y.o. MRN: [id number]  Chief Complaint  Patient presents with   New Patient (Initial Visit)    Patient is a 43 year old female G4 P1021 at 35w 1 d seen to establish care and follow-up on chronic conditions.   She has experienced severe gastroesophageal reflux disease (GERD) since 2016, with symptoms including nausea and vomiting occurring a couple of days a week. These symptoms are often triggered by taking medications on an empty stomach or consuming greasy foods. She manages her GERD by adjusting meal times and sizes, and taking famotidine . She was hospitalized for four days during her first episode in 2016. She also has a history of dumping syndrome and a hiatal hernia, and has not had surgery for these conditions yet.  She is currently managing gestational diabetes with fast-acting insulin (10 units at breakfast, 8 units at lunch, and 10 units at dinner) and long-acting insulin (14 units of Lantus). She regularly checks her blood sugar levels and is under the care of maternal fetal medicine. Her due date is July 1st.  She has a history of migraines, which have not occurred during her current pregnancy but previously occurred four to five times a month. Her migraines were associated with light and sound sensitivity, and nausea and vomiting. She was taking a new medication in pouches, magnesium, and CoQ10 for prevention. She has stopped ibuprofen  due to digestive issues and pregnancy, and currently uses Tylenol .  She has a peanut allergy that causes throat itching, but she does not have an EpiPen as she avoids peanuts. 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 She does not use tobacco, alcohol, or  drugs.    Patient Active Problem List   Diagnosis Date Noted   Acute esophagitis 10/19/2022   Uterine leiomyoma 10/17/2022   Gastroesophageal reflux disease with esophagitis and hemorrhage 05/26/2021   Hyperglycemia 05/03/2021   Obesity, Class III, BMI 40-49.9 (morbid obesity) 05/03/2021   Hypokalemia 04/29/2021   Hiccups 04/28/2021   Pyuria 04/26/2021   Intractable nausea and vomiting 04/25/2021   Essential hypertension 04/25/2021   Asthma 04/25/2021   Pleurisy 08/09/2016   Acute bronchitis 08/09/2016   Migraine without aura and without status migrainosus, not intractable 06/11/2014   OSA (obstructive sleep apnea) 08/13/2012   Anxiety 07/14/2012   Depression 07/14/2012   Hyperlipidemia 07/14/2012   Headache 07/14/2012   Allergic rhinitis 07/14/2012   Vitamin D  deficiency 07/14/2012   Family history of colon cancer 07/14/2012   Past Medical History:  Diagnosis Date   Allergy    Anxiety    ASCVD (arteriosclerotic cardiovascular disease) 2011   Asthma    Blood type, Rh negative    Depression    GERD (gastroesophageal reflux disease)    H/O migraine 10/25/2010   H/O toxoplasmosis    H/O varicella    pt denies    History of elevated lipids 2011   Hx: UTI (urinary tract infection) 2007   Hypertension    IBS (irritable bowel syndrome)    Irregular menstrual cycle 2004   Migraine    Nausea & vomiting 04/27/2021   Obese    Sleep apnea    Weight gain 2004   Past Surgical History:  Procedure Laterality Date  BIOPSY  05/04/2021   Procedure: BIOPSY;  Surgeon: Felecia Hopper, MD;  Location: [facility] ENDOSCOPY;  Service: Gastroenterology;;   BIOPSY  10/19/2022   Procedure: BIOPSY;  Surgeon: Daina Drum, MD;  Location: [facility] ENDOSCOPY;  Service: Gastroenterology;;   ESOPHAGOGASTRODUODENOSCOPY N/A 05/04/2021   Procedure: ESOPHAGOGASTRODUODENOSCOPY (EGD);  Surgeon: Felecia Hopper, MD;  Location: [facility];  Service: Gastroenterology;  Laterality: N/A;    ESOPHAGOGASTRODUODENOSCOPY (EGD) WITH PROPOFOL  N/A 10/19/2022   Procedure: ESOPHAGOGASTRODUODENOSCOPY (EGD) WITH PROPOFOL ;  Surgeon: Daina Drum, MD;  Location: [facility] ENDOSCOPY;  Service: Gastroenterology;  Laterality: N/A;   gastroenteritis     LAPAROSCOPIC BILATERAL SALPINGECTOMY Left 11/18/2021   Procedure: LAPAROSCOPIC LEFT SALPINGECTOMY WITH REMOVAL OF ECTOPIC PREGNANCY;  Surgeon: Arlee Lace, MD;  Location: [facility] OR;  Service: Gynecology;  Laterality: Left;   WISDOM TOOTH EXTRACTION     Social History   Tobacco Use   Smoking status: Never   Smokeless tobacco: Never  Substance Use Topics   Alcohol use: No   Drug use: No   Family History  Problem Relation Age of Onset   Heart disease Mother    Hypertension Mother    Cancer Mother    Hypertension Father    Anemia Father    Hypertension Maternal Grandmother    Heart disease Paternal Grandfather        MI   Cancer Maternal Uncle    Cancer Paternal Aunt    Alcohol abuse Paternal Uncle    No Known Allergies  ROS Negative unless stated above    Objective:      LMP 12/13/2022 Comment: Negative urine preg from ED 7/31 BP Readings from Last 3 Encounters:  03/30/23 108/78  01/18/23 (!) 142/97  10/21/22 118/76   Wt Readings from Last 3 Encounters:  03/30/23 (!) 321 lb (145.6 kg)  01/18/23 (!) 305 lb (138.3 kg)  10/19/22 300 lb (136.1 kg)      Physical Exam Constitutional:      General: She is not in acute distress.    Appearance: Normal appearance.  HENT:     Head: Normocephalic and atraumatic.     Nose: Nose normal.     Mouth/Throat:     Mouth: Mucous membranes are moist.  Cardiovascular:     Rate and Rhythm: Normal rate and regular rhythm.     Heart sounds: Normal heart sounds. No murmur heard.    No gallop.  Pulmonary:     Effort: Pulmonary effort is normal. No respiratory distress.     Breath sounds: Normal breath sounds. No wheezing, rhonchi or rales.  Abdominal:     General: Bowel sounds are normal.      Comments: Gravid.  Skin:    General: Skin is warm and dry.  Neurological:     Mental Status: She is alert and oriented to person, place, and time.        08/09/2016    8:23 AM 03/03/2015    4:25 PM  Depression screen PHQ 2/9  Decreased Interest 0 0  Down, Depressed, Hopeless 0 0  PHQ - 2 Score 0 0       No data to display           No results found for any visits on 08/16/23.    Assessment & Plan:   [redacted] weeks gestation of pregnancy  Insulin controlled gestational diabetes mellitus (GDM) in third trimester  Gastroesophageal reflux disease, unspecified whether esophagitis present  Chronic hypertension  Dumping syndrome  Hiatal hernia  History of migraine  Encounter to establish care  Peanut allergy  Severe GERD with nausea and vomiting   Chronic severe GERD is exacerbated by certain foods and medications on an empty stomach. It is managed with dietary modifications and famotidine . Due to the severity and previous unsuccessful medication attempts, a surgical intervention with hiatal hernia repair and Roux-en-Y gastric bypass is planned. Continue famotidine  and proceed with the planned surgery in December.  Hiatal hernia   The hiatal hernia contributes to severe GERD symptoms. Surgical repair is planned to alleviate symptoms and improve quality of life. Roux-en-Y gastric bypass is advised as a more effective solution. Proceed with the planned hiatal hernia repair in December.  Dumping syndrome   Dumping syndrome is likely related to gastrointestinal issues, including GERD and hiatal hernia.  Gestational diabetes on insulin   Pt is a 43 year old G25P1021 high risk pregnancy.  Continue follow-up with MFM.  Continue Humalog 10 units at breakfast, 8 at lunch, 10 at dinner and Lantus 14 units nightly.  Continue the current insulin regimen, monitor blood sugar levels regularly, and encourage increased water intake.  Migraine without aura   Migraines without aura  subsided during pregnancy but are expected to return postpartum. She was previously managed with medication, magnesium, and CoQ10. There is anticipation of migraines post-delivery due to hormonal changes. Increase water intake to help prevent migraines.  Patient advised to set up follow-up appointment with neurology to address migraine management, especially in the context of breastfeeding.  cHTN Previously on medication prior to pregnancy.  Not currently on medication.  Continue to monitor  Peanut allergy Discussed the importance of avoidance.  Offered Rx for EpiPen.  Patient declines at this time.  Return as needed.   Viola Greulich, MD

## 2023-08-31 IMAGING — CT CT ABD-PELV W/ CM
2 of 5 series · 16 of 46 positions shown, 18 images · IV contrast (Omnipaque)
Comparison: None.

CLINICAL DATA: Abdominal pain.

EXAM:
CT ABDOMEN AND PELVIS WITH CONTRAST
TECHNIQUE: Multidetector CT imaging of the abdomen and pelvis was performed
using the standard protocol following bolus administration of
intravenous contrast.

[Series 2: axial st · axial · 0.98mm/px · z∈[-542,-42]mm · 13 of 112 slices shown, 15 images]
[im 6/112  soft-tissue]
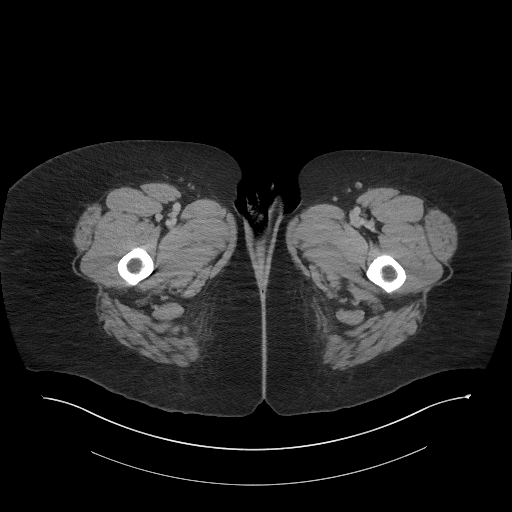
[im 6/112  bone]
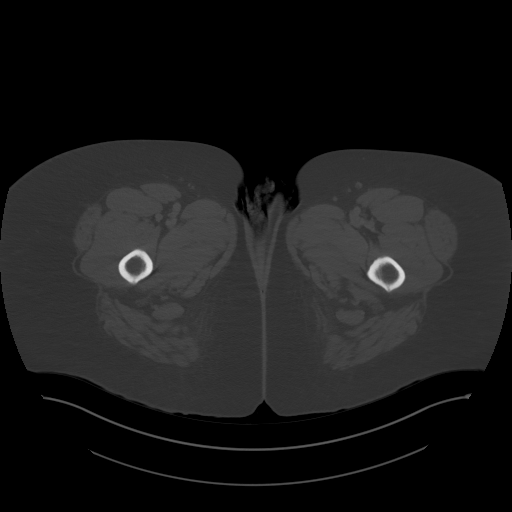
[im 18/112  soft-tissue]
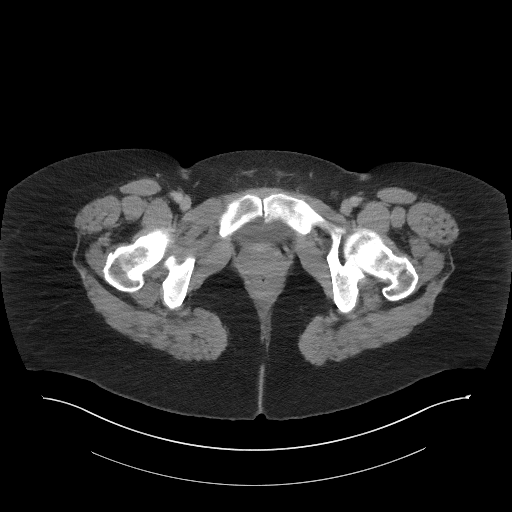
[im 24/112  soft-tissue]
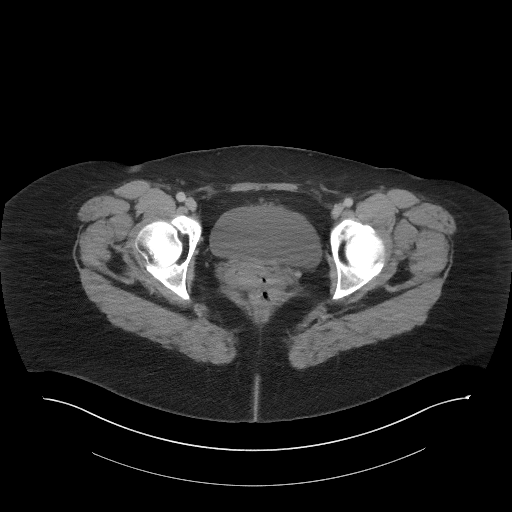
[im 30/112  soft-tissue]
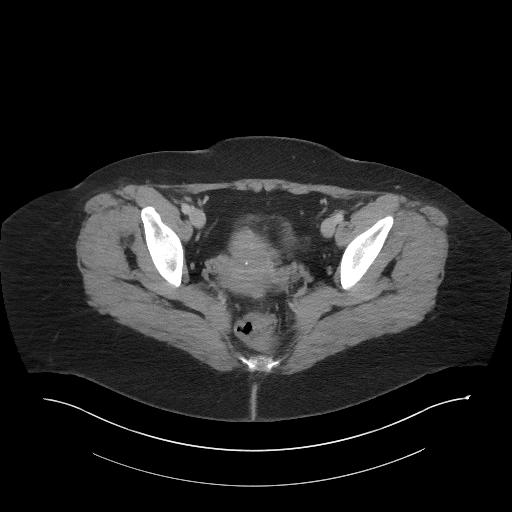
[im 41/112  soft-tissue]
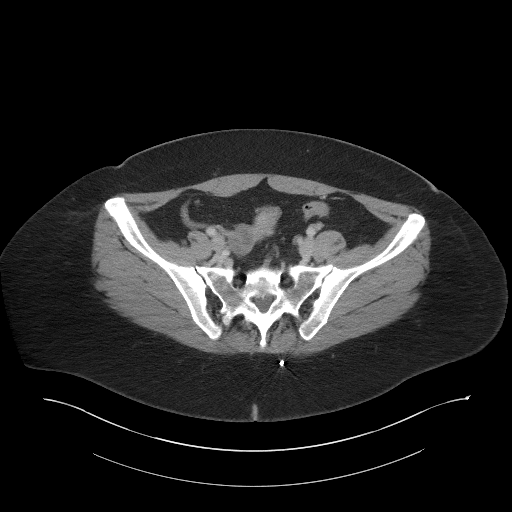
[im 47/112  soft-tissue]
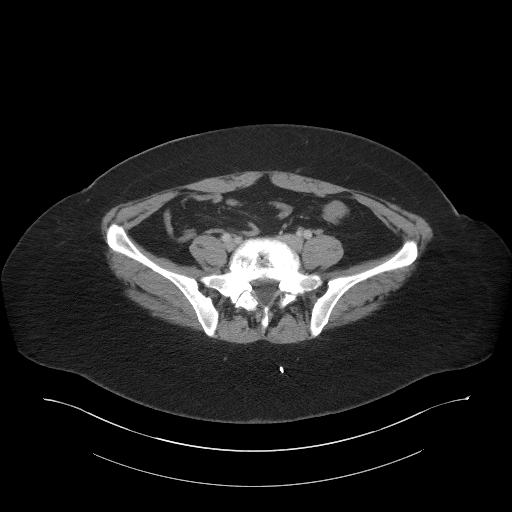
[im 59/112  soft-tissue]
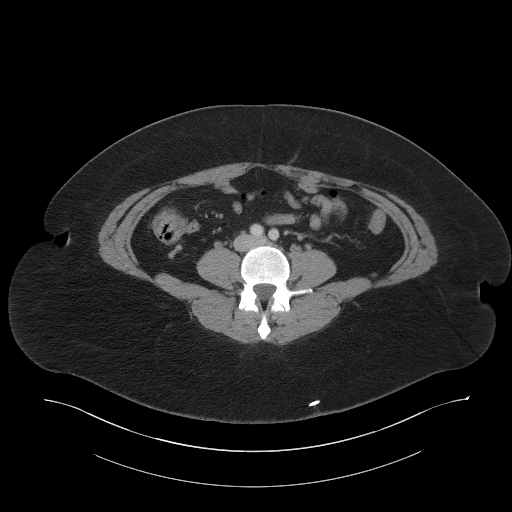
[im 65/112  soft-tissue]
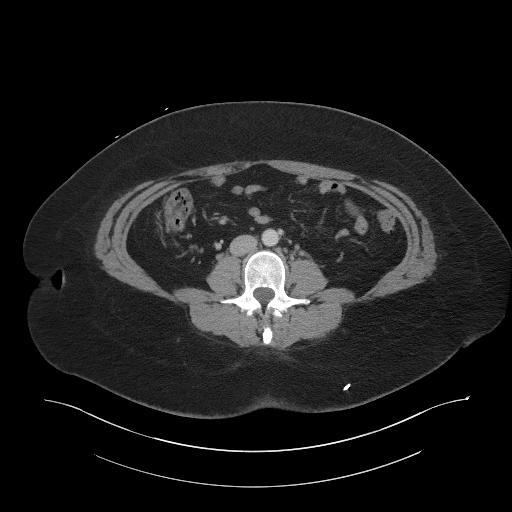
[im 71/112  soft-tissue]
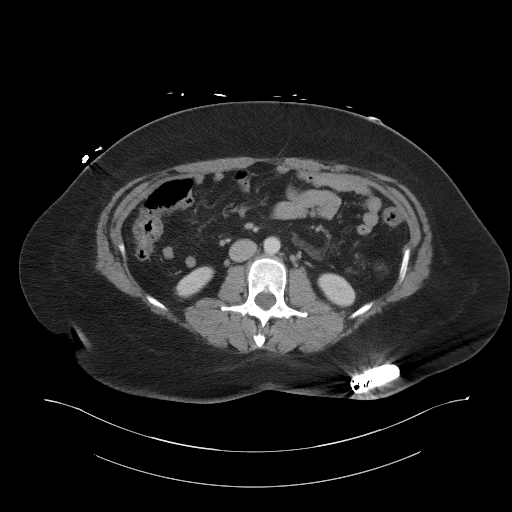
[im 71/112  bone]
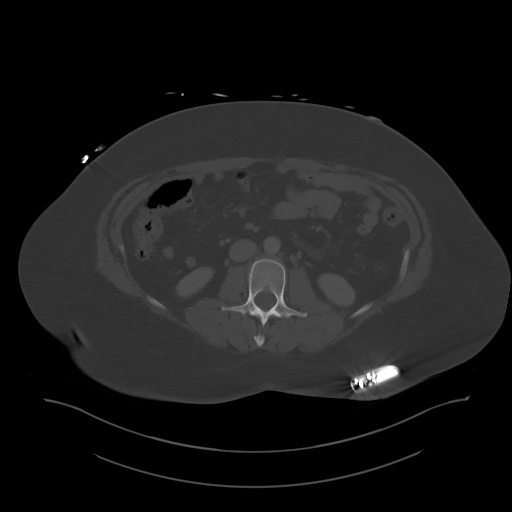
[im 82/112  soft-tissue]
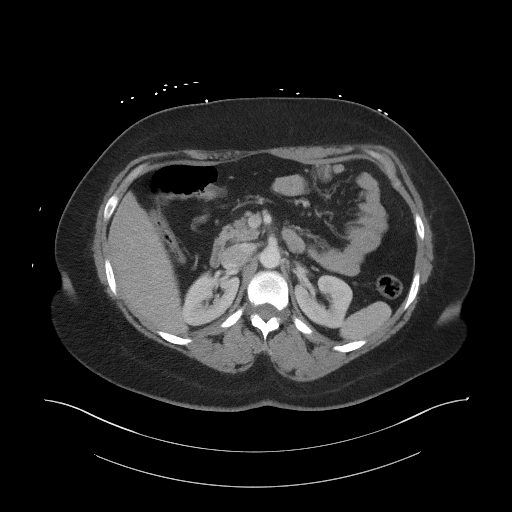
[im 88/112  soft-tissue]
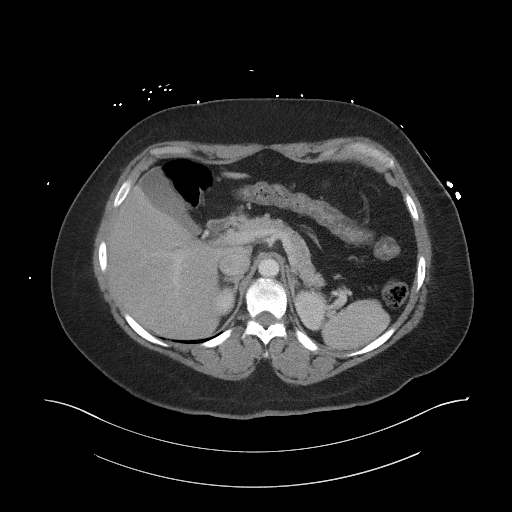
[im 94/112  soft-tissue]
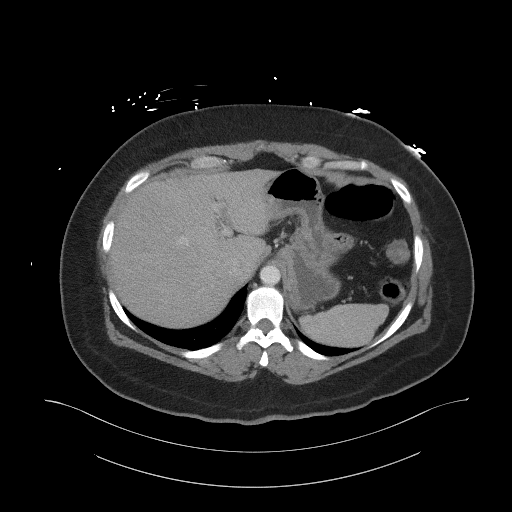
[im 106/112  soft-tissue]
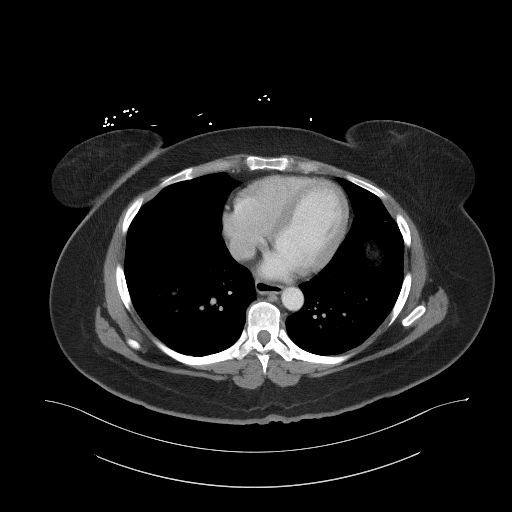

[Series 5: coronal st · coronal · 0.99mm/px · 3 of 104 slices shown]
[im 35/104  soft-tissue]
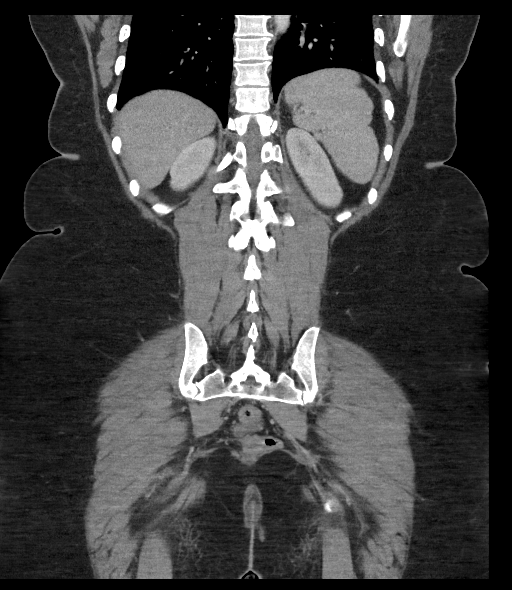
[im 46/104  soft-tissue]
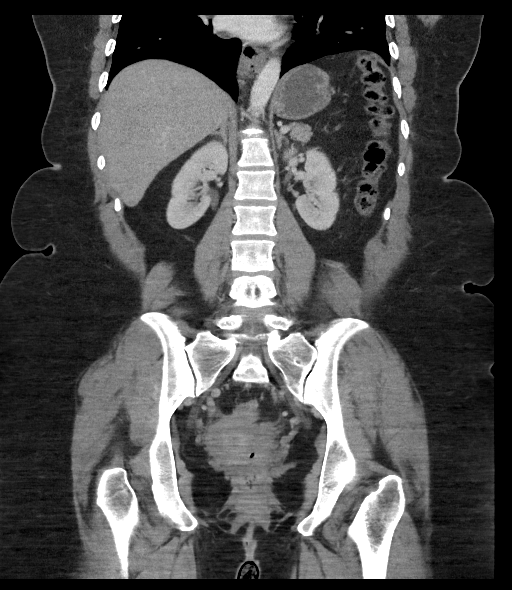
[im 58/104  soft-tissue]
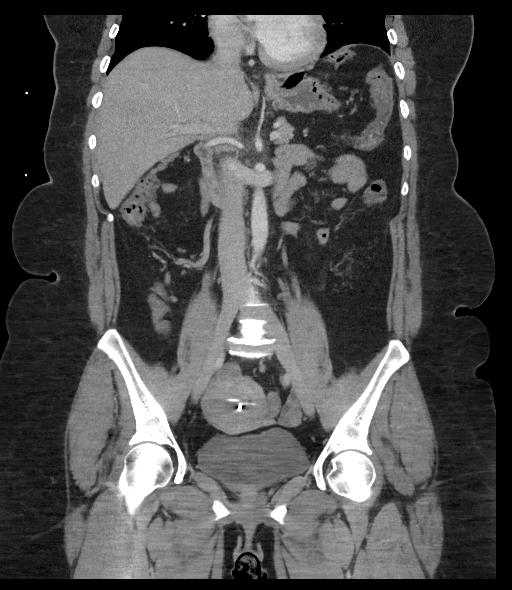

[16 of 46 positions shown; findings below may reference images not displayed]

RADIATION DOSE REDUCTION: This exam was performed according to the
departmental dose-optimization program which includes automated
exposure control, adjustment of the mA and/or kV according to
patient size and/or use of iterative reconstruction technique.

CONTRAST:  100mL OMNIPAQUE IOHEXOL 300 MG/ML  SOLN
FINDINGS: Lower chest: No acute abnormality.

Hepatobiliary: No focal liver abnormality is seen. Low-attenuation
of the liver as can be seen with hepatic steatosis. No gallstones,
gallbladder wall thickening, or biliary dilatation.

Pancreas: Unremarkable. No pancreatic ductal dilatation or
surrounding inflammatory changes.

Spleen: Normal in size without focal abnormality.

Adrenals/Urinary Tract: Adrenal glands are unremarkable. Kidneys are
normal, without renal calculi, focal lesion, or hydronephrosis.
Bladder is unremarkable.

Stomach/Bowel: Stomach is within normal limits. No evidence of bowel
wall thickening, distention, or inflammatory changes. Appendix is
normal.

Vascular/Lymphatic: No significant vascular findings are present. No
enlarged abdominal or pelvic lymph nodes.

Reproductive: Uterus and bilateral adnexa are unremarkable. T-type
IUD in the uterus.

Other: No abdominal wall hernia or abnormality. No abdominopelvic
ascites.

Musculoskeletal: No acute osseous abnormality. No aggressive osseous
lesion. Mild osteoarthritis of bilateral SI joints. Degenerative
disease with severe disc height loss at L5-S1. Minimal
retrolisthesis of L5 on S1. Bilateral foraminal stenosis at L5-S1.
Stimulator power pack along the left flank with the lead terminating
in the medial aspect of the left gluteus maximus muscle.
IMPRESSION: 1. No acute abdominal or pelvic pathology.
2. Hepatic steatosis.

## 2023-09-11 LAB — LAB REPORT - SCANNED: EGFR: 70

## 2023-11-08 ENCOUNTER — Encounter: Admitting: Family Medicine

## 2023-11-21 ENCOUNTER — Encounter: Admitting: Family Medicine

## 2023-11-27 ENCOUNTER — Ambulatory Visit: Admitting: Family Medicine

## 2023-11-27 ENCOUNTER — Encounter: Payer: Self-pay | Admitting: Family Medicine

## 2023-11-27 DIAGNOSIS — Z8632 Personal history of gestational diabetes: Secondary | ICD-10-CM

## 2023-11-27 DIAGNOSIS — Z0000 Encounter for general adult medical examination without abnormal findings: Secondary | ICD-10-CM

## 2023-11-27 DIAGNOSIS — Z23 Encounter for immunization: Secondary | ICD-10-CM

## 2023-11-27 DIAGNOSIS — I10 Essential (primary) hypertension: Secondary | ICD-10-CM | POA: Diagnosis not present

## 2023-11-27 DIAGNOSIS — F320 Major depressive disorder, single episode, mild: Secondary | ICD-10-CM

## 2023-11-27 LAB — COMPREHENSIVE METABOLIC PANEL WITH GFR
ALT: 13 U/L (ref 0–35)
AST: 18 U/L (ref 0–37)
Albumin: 4.4 g/dL (ref 3.5–5.2)
Alkaline Phosphatase: 97 U/L (ref 39–117)
BUN: 18 mg/dL (ref 6–23)
CO2: 28 meq/L (ref 19–32)
Calcium: 9.3 mg/dL (ref 8.4–10.5)
Chloride: 103 meq/L (ref 96–112)
Creatinine, Ser: 1.01 mg/dL (ref 0.40–1.20)
GFR: 67.8 mL/min (ref >60.00)
Glucose, Bld: 84 mg/dL (ref 70–99)
Potassium: 3.7 meq/L (ref 3.5–5.1)
Sodium: 138 meq/L (ref 135–145)
Total Bilirubin: 0.4 mg/dL (ref 0.2–1.2)
Total Protein: 7.4 g/dL (ref 6.0–8.3)

## 2023-11-27 LAB — TSH: TSH: 1.67 u[IU]/mL (ref 0.35–5.50)

## 2023-11-27 LAB — LIPID PANEL
Cholesterol: 247 mg/dL — ABNORMAL HIGH (ref 0–200)
HDL: 58.9 mg/dL (ref >39.00)
LDL Cholesterol: 172 mg/dL — ABNORMAL HIGH (ref 0–99)
NonHDL: 188.5
Total CHOL/HDL Ratio: 4
Triglycerides: 84 mg/dL (ref 0.0–149.0)
VLDL: 16.8 mg/dL (ref 0.0–40.0)

## 2023-11-27 LAB — CBC WITH DIFFERENTIAL/PLATELET
Basophils Absolute: 0 K/uL (ref 0.0–0.1)
Basophils Relative: 0.5 % (ref 0.0–3.0)
Eosinophils Absolute: 0.1 K/uL (ref 0.0–0.7)
Eosinophils Relative: 1.3 % (ref 0.0–5.0)
HCT: 41.4 % (ref 36.0–46.0)
Hemoglobin: 13.4 g/dL (ref 12.0–15.0)
Lymphocytes Relative: 38.1 % (ref 12.0–46.0)
Lymphs Abs: 1.7 K/uL (ref 0.7–4.0)
MCHC: 32.4 g/dL (ref 30.0–36.0)
MCV: 86.9 fl (ref 78.0–100.0)
Monocytes Absolute: 0.5 K/uL (ref 0.1–1.0)
Monocytes Relative: 10.3 % (ref 3.0–12.0)
Neutro Abs: 2.2 K/uL (ref 1.4–7.7)
Neutrophils Relative %: 49.8 % (ref 43.0–77.0)
Platelets: 276 K/uL (ref 150.0–400.0)
RBC: 4.77 Mil/uL (ref 3.87–5.11)
RDW: 17.5 % — ABNORMAL HIGH (ref 11.5–15.5)
WBC: 4.5 K/uL (ref 4.0–10.5)

## 2023-11-27 LAB — HEMOGLOBIN A1C: Hgb A1c MFr Bld: 5.5 % (ref 4.6–6.5)

## 2023-11-27 LAB — T4, FREE: Free T4: 0.76 ng/dL (ref 0.60–1.60)

## 2023-11-27 MED ORDER — NIFEDIPINE ER OSMOTIC RELEASE 60 MG PO TB24: 60.0000 mg | ORAL_TABLET | Freq: Every day | ORAL | 3 refills | Status: AC

## 2023-11-27 NOTE — Progress Notes (Signed)
 Established Patient Office Visit   Subjective  Patient ID: Monique Shaw, female    DOB: 11-May-1980  Age: 43 y.o. MRN: [id number]  Chief Complaint  Patient presents with   Annual Exam    Question about diabetes after having a baby     Pt is a 43 yo female seen for CPE.  Pt concerned about DM postpartum.  Had gDM and was on insulin QID.  Not currently on meds or checking bs.  Pt also switched to procardia  90 mg for cHTN.  Not currently checking bp, but taking med. Pt is breastfeeding.  Had a baby girl named Visual merchandiser.  Baby is sleeping through most nights.  Pt states her mood is a work in progress.  Seeing Psychiatry.  Meds recently adjusted.  Has f/u appt next wk.    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   Gastroesophageal reflux disease 08/16/2023   Insulin controlled gestational diabetes mellitus (GDM) in third trimester 08/16/2023   History of migraine 08/16/2023   Peanut allergy 08/16/2023   Acute esophagitis 10/19/2022   Uterine leiomyoma 10/17/2022   Gastroesophageal reflux disease with esophagitis and hemorrhage 05/26/2021   Hyperglycemia 05/03/2021   Obesity, Class III, BMI 40-49.9 (morbid obesity) 05/03/2021   Hypokalemia 04/29/2021   Hiccups 04/28/2021   Pyuria 04/26/2021   Intractable nausea and vomiting 04/25/2021   Essential hypertension 04/25/2021   Asthma 04/25/2021   Pleurisy 08/09/2016   Acute bronchitis 08/09/2016   Migraine without aura and without status migrainosus, not intractable 06/11/2014   OSA (obstructive sleep apnea) 08/13/2012   Anxiety 07/14/2012   Depression 07/14/2012   Hyperlipidemia 07/14/2012   Headache 07/14/2012   Allergic rhinitis 07/14/2012   Vitamin D  deficiency 07/14/2012   Family history of colon cancer 07/14/2012   Past Medical History:  Diagnosis Date   Allergy    Anxiety    ASCVD (arteriosclerotic cardiovascular disease) 2011   Asthma    Blood type, Rh  negative    Depression    GERD (gastroesophageal reflux disease)    H/O migraine 10/25/2010   H/O toxoplasmosis    H/O varicella    pt denies    History of elevated lipids 2011   Hx: UTI (urinary tract infection) 2007   Hypertension    IBS (irritable bowel syndrome)    Irregular menstrual cycle 2004   Migraine    Nausea & vomiting 04/27/2021   Obese    Sleep apnea    Weight gain 2004   Past Surgical History:  Procedure Laterality Date   BIOPSY  05/04/2021   Procedure: BIOPSY;  Surgeon: Elicia Claw, MD;  Location: [facility];  Service: Gastroenterology;;   BIOPSY  10/19/2022   Procedure: BIOPSY;  Surgeon: Federico Rosario BROCKS, MD;  Location: [facility] END[facility];  Service: Gastroenterology;;   ESOPHAGOGASTRODUODENOSCOPY N/A 05/04/2021   Procedure: ESOPHAGOGASTRODUODENOSCOPY (EGD);  Surgeon: Elicia Claw, MD;  Location: [facility];  Service: Gastroenterology;  Laterality: N/A;   ESOPHAGOGASTRODUODENOSCOPY (EGD) WITH PROPOFOL  N/A 10/19/2022   Procedure: ESOPHAGOGASTRODUODENOSCOPY (EGD) WITH PROPOFOL ;  Surgeon: Federico Rosario BROCKS, MD;  Location: [facility] ENDOSCOPY;  Service: Gastroenterology;  Laterality: N/A;   gastroenteritis     LAPAROSCOPIC BILATERAL SALPINGECTOMY Left 11/18/2021   Procedure: LAPAROSCOPIC LEFT SALPINGECTOMY WITH REMOVAL OF ECTOPIC PREGNANCY;  Surgeon: Rosalva Sawyer, MD;  Location: [facility] OR;  Service: Gynecology;  Laterality: Left;   WISDOM TOOTH EXTRACTION     Social History   Tobacco Use  Smoking status: Never   Smokeless tobacco: Never  Substance Use Topics   Alcohol use: No   Drug use: No   Family History  Problem Relation Age of Onset   Heart disease Mother    Hypertension Mother    Cancer Mother    Hypertension Father    Anemia Father    Hypertension Maternal Grandmother    Heart disease Paternal Grandfather        MI   Cancer Maternal Uncle    Cancer Paternal Aunt    Alcohol abuse Paternal Uncle    Allergies  Allergen Reactions   Peanut-Containing Drug  Products Itching    Itching of tongue and throat if ingested.    ROS Negative unless stated above    Objective:     BP 100/62 (BP Location: Left Arm, Patient Position: Sitting, Cuff Size: Large)   Pulse 70   Temp 98.4 F (36.9 C) (Oral)   Ht 5' 9 (1.753 m)   Wt 290 lb (131.5 kg)   LMP 11/01/2023 Comment: Negative urine preg from ED 7/31  SpO2 99%   Breastfeeding Yes   BMI 42.83 kg/m  BP Readings from Last 3 Encounters:  11/27/23 100/62  08/16/23 122/84  03/30/23 108/78   Wt Readings from Last 3 Encounters:  11/27/23 290 lb (131.5 kg)  08/16/23 (!) 318 lb 12.8 oz (144.6 kg)  03/30/23 (!) 321 lb (145.6 kg)      Physical Exam Constitutional:      Appearance: Normal appearance.  HENT:     Head: Normocephalic and atraumatic.     Right Ear: Tympanic membrane, ear canal and external ear normal.     Left Ear: Tympanic membrane, ear canal and external ear normal.     Nose: Nose normal.     Mouth/Throat:     Mouth: Mucous membranes are moist.     Pharynx: No oropharyngeal exudate or posterior oropharyngeal erythema.  Eyes:     General: No scleral icterus.    Extraocular Movements: Extraocular movements intact.     Conjunctiva/sclera: Conjunctivae normal.     Pupils: Pupils are equal, round, and reactive to light.  Neck:     Thyroid: No thyromegaly.     Vascular: No carotid bruit.  Cardiovascular:     Rate and Rhythm: Normal rate and regular rhythm.     Pulses: Normal pulses.     Heart sounds: Normal heart sounds. No murmur heard.    No friction rub.  Pulmonary:     Effort: Pulmonary effort is normal.     Breath sounds: Normal breath sounds. No wheezing, rhonchi or rales.  Abdominal:     General: Bowel sounds are normal.     Palpations: Abdomen is soft.     Tenderness: There is no abdominal tenderness.  Musculoskeletal:        General: No deformity. Normal range of motion.  Lymphadenopathy:     Cervical: No cervical adenopathy.  Skin:    General: Skin is  warm and dry.     Findings: No lesion.  Neurological:     General: No focal deficit present.     Mental Status: She is alert and oriented to person, place, and time.  Psychiatric:        Mood and Affect: Mood normal.        Thought Content: Thought content normal.        11/27/2023   10:43 AM 08/16/2023    1:50 PM 08/09/2016    8:23 AM  Depression screen PHQ 2/9  Decreased Interest 2 0 0  Down, Depressed, Hopeless 3 0 0  PHQ - 2 Score 5 0 0  Altered sleeping 2 0   Tired, decreased energy 1 0   Change in appetite 1 0   Feeling bad or failure about yourself  0 0   Trouble concentrating 2 0   Moving slowly or fidgety/restless 0 0   Suicidal thoughts 0 0   PHQ-9 Score 11 0   Difficult doing work/chores Somewhat difficult        11/27/2023   10:44 AM 08/16/2023    1:51 PM  GAD 7 : Generalized Anxiety Score  Nervous, Anxious, on Edge 1 0  Control/stop worrying 1 0  Worry too much - different things 1 0  Trouble relaxing 2 0  Restless 2 0  Easily annoyed or irritable 1 0  Afraid - awful might happen 0 0  Total GAD 7 Score 8 0     No results found for any visits on 11/27/23.    Assessment & Plan:   Well adult exam -     CBC with Differential/Platelet; Future -     Comprehensive metabolic panel with GFR; Future -     Hemoglobin A1c; Future -     Lipid panel; Future -     T4, free; Future -     TSH; Future  Chronic hypertension -     Comprehensive metabolic panel with GFR; Future -     T4, free; Future -     TSH; Future -     NIFEdipine  ER Osmotic Release; Take 1 tablet (60 mg total) by mouth daily.  Dispense: 90 tablet; Refill: 3  History of gestational diabetes -     Hemoglobin A1c; Future  Influenza vaccine needed -     Flu vaccine trivalent PF, 6mos and older(Flulaval,Afluria,Fluarix,Fluzone)  Major depressive disorder, single episode, mild (HCC)   Age-appropriate health screenings discussed.  Obtain labs.  Discussed lifestyle modifications to help  reduce risk of developing diabetes due to history of gestational diabetes.  Chronic hypertension.  As BP well-controlled in clinic will decrease Procardia  from 90 mg to 60 mg daily.  Patient to monitor BP at home and keep a log of readings.  Will work to further decrease dose if possible.  Continue follow-up with psychiatry for MDD.  Given strict precautions.  Flu vaccine given this visit.  Mammogram to be scheduled later as pt breast-feeding.  Colonoscopy not yet due.  Next CPE in 1 year.  Return in about 18 weeks (around 04/01/2024) for blood pressure.   [name], MD

## 2023-11-27 NOTE — Patient Instructions (Addendum)
 A prescription for Procardia  XL 60 mg daily was sent to your pharmacy.  Continue monitoring your blood pressure at home while you are taking the lower dose of blood pressure medication.  We will work to try to further decrease the medication with consistent changes to diet and exercise.  Keep a log of your blood pressure readings to bring to your next appointment in 3-4 months.

## 2023-12-04 ENCOUNTER — Ambulatory Visit: Payer: Self-pay | Admitting: Family Medicine

## 2024-01-07 ENCOUNTER — Encounter: Payer: Self-pay | Admitting: Family Medicine

## 2024-01-13 ENCOUNTER — Telehealth (INDEPENDENT_AMBULATORY_CARE_PROVIDER_SITE_OTHER): Admitting: Family Medicine

## 2024-01-13 ENCOUNTER — Encounter: Payer: Self-pay | Admitting: Family Medicine

## 2024-01-13 DIAGNOSIS — E66813 Obesity, class 3: Secondary | ICD-10-CM | POA: Diagnosis not present

## 2024-01-13 NOTE — Progress Notes (Signed)
 Virtual Visit via Video Note  I connected with Monique Shaw on 01/13/24 at  2:30 PM EDT by a video enabled telemedicine application and verified that I am speaking with the correct person using two identifiers.  Location patient: home Location provider:work or home office Persons participating in the virtual visit: patient, provider  I discussed the limitations of evaluation and management by telemedicine and the availability of in person appointments. The patient expressed understanding and agreed to proceed. Chief Complaint  Patient presents with   Acute Visit    Patient has questions about medication and metformin     HPI: Pt is a 43  yo female seen for f/u.  Pt inquiring about wt loss meds.  Pt is beast feeding.  Doing high protein (lean meats like chicken, sting cheese, premier protein in coffee, boiled egg and limiting carbs.  Doing 10k steps per day 4-5 days per wk. Typically does ~5k steps at work, but will walk after work to get the additional 5k.   And drinking 80 oz water.  Pt does not think her insurance covers GLP-1s.   Currently 290 lbs.  Pre pregnancy wt 300 lbs.  ROS: See pertinent positives and negatives per HPI.  Past Medical History:  Diagnosis Date   Allergy    Anxiety    ASCVD (arteriosclerotic cardiovascular disease) 2011   Asthma    Blood type, Rh negative    Depression    GERD (gastroesophageal reflux disease)    H/O migraine 10/25/2010   H/O toxoplasmosis    H/O varicella    pt denies    History of elevated lipids 2011   Hx: UTI (urinary tract infection) 2007   Hypertension    IBS (irritable bowel syndrome)    Irregular menstrual cycle 2004   Migraine    Nausea & vomiting 04/27/2021   Obese    Sleep apnea    Weight gain 2004    Past Surgical History:  Procedure Laterality Date   BIOPSY  05/04/2021   Procedure: BIOPSY;  Surgeon: Elicia Claw, MD;  Location: [facility];  Service: Gastroenterology;;   BIOPSY  10/19/2022   Procedure: BIOPSY;   Surgeon: Federico Rosario BROCKS, MD;  Location: [facility] ENDOSCOPY;  Service: Gastroenterology;;   ESOPHAGOGASTRODUODENOSCOPY N/A 05/04/2021   Procedure: ESOPHAGOGASTRODUODENOSCOPY (EGD);  Surgeon: Elicia Claw, MD;  Location: [facility];  Service: Gastroenterology;  Laterality: N/A;   ESOPHAGOGASTRODUODENOSCOPY (EGD) WITH PROPOFOL  N/A 10/19/2022   Procedure: ESOPHAGOGASTRODUODENOSCOPY (EGD) WITH PROPOFOL ;  Surgeon: Federico Rosario BROCKS, MD;  Location: [facility] ENDOSCOPY;  Service: Gastroenterology;  Laterality: N/A;   gastroenteritis     LAPAROSCOPIC BILATERAL SALPINGECTOMY Left 11/18/2021   Procedure: LAPAROSCOPIC LEFT SALPINGECTOMY WITH REMOVAL OF ECTOPIC PREGNANCY;  Surgeon: Rosalva Sawyer, MD;  Location: [facility] OR;  Service: Gynecology;  Laterality: Left;   WISDOM TOOTH EXTRACTION      Family History  Problem Relation Age of Onset   Heart disease Mother    Hypertension Mother    Cancer Mother    Hypertension Father    Anemia Father    Hypertension Maternal Grandmother    Heart disease Paternal Grandfather        MI   Cancer Maternal Uncle    Cancer Paternal Aunt    Alcohol abuse Paternal Uncle     Current Outpatient Medications:    buPROPion  (WELLBUTRIN  SR) 150 MG 12 hr tablet, Take 150 mg by mouth., Disp: , Rfl:    dexlansoprazole (DEXILANT) 60 MG capsule, Take 1 capsule by mouth daily., Disp: ,  Rfl:    diphenhydrAMINE  (BENADRYL ) 25 mg capsule, Take 1-2 capsules (25-50 mg total) by mouth every 6 (six) hours as needed (Headache)., Disp: 90 capsule, Rfl: 1   metoCLOPramide  (REGLAN ) 10 MG tablet, Take 1 tablet (10 mg total) by mouth 3 (three) times daily with meals., Disp: 90 tablet, Rfl: 1   NIFEdipine  (PROCARDIA  XL/NIFEDICAL XL) 60 MG 24 hr tablet, Take 1 tablet (60 mg total) by mouth daily., Disp: 90 tablet, Rfl: 3   ondansetron  (ZOFRAN -ODT) 4 MG disintegrating tablet, Take 4 mg by mouth as needed for nausea or vomiting., Disp: , Rfl:   EXAM:  VITALS per patient if applicable:  RR between 12-20 bpm.  Wt  290 lbs  GENERAL: alert, oriented, appears well and in no acute distress  HEENT: atraumatic, conjunctiva clear, no obvious abnormalities on inspection of external nose and ears  NECK: normal movements of the head and neck  LUNGS: on inspection no signs of respiratory distress, breathing rate appears normal, no obvious gross SOB, gasping or wheezing  CV: no obvious cyanosis  MS: moves all visible extremities without noticeable abnormality  PSYCH/NEURO: pleasant and cooperative, no obvious depression or anxiety, speech and thought processing grossly intact  ASSESSMENT AND PLAN:  Discussed the following assessment and plan:  Obesity, Class III, BMI 40-49.9 (morbid obesity) (HCC) - Plan: Amb Ref to Medical Weight Management Current BMI 42.83 kg/m and wt 290 lbs.  Continue lifestyle modifications.  Pt advised on limitation to wt loss medicine options as currently breast feeding.  Pt request a med be sent in as she weans.  Request declined at this time.  Referral to wt management placed.   I discussed the assessment and treatment plan with the patient. The patient was provided an opportunity to ask questions and all were answered. The patient agreed with the plan and demonstrated an understanding of the instructions.   The patient was advised to call back or seek an in-person evaluation if the symptoms worsen or if the condition fails to improve as anticipated.   Monique JONELLE Single, MD

## 2024-01-29 ENCOUNTER — Encounter: Payer: Self-pay | Admitting: Family Medicine

## 2024-02-06 ENCOUNTER — Telehealth (INDEPENDENT_AMBULATORY_CARE_PROVIDER_SITE_OTHER): Payer: Self-pay | Admitting: Family Medicine

## 2024-02-06 NOTE — Telephone Encounter (Signed)
 New Patient Script Check List Patient Acknowledges Each Item Below:   [ x]  There is a one-time $99.00 Program Fee.   [x ]  You must have a Body Mass Index (BMI) of 30 or higher to qualify for our program.   [ x]  Once you become a New Patient and schedule your first appointment, you are responsible for completing our New Patient Packet.   [ x]  Prior to the first appointment, you will be required to fast for eight hours   [ x]  You must also drink plenty of water that day before and morning of your new patient appointment.   [x ]  You will also have an electrocardiogram (EKG) during your New Patient visit  do not apply any lotions or creams prior to the appointment.   [ ] x  You will also have a metabolic breathing test known as the Indirect Calorimetry (IC) Test   [ x]  We are a Specialty office, but we bill as a Primary Care. You will have the same co-payments and co-insurances as you would at your primary care provider's office.   [x ]  We have a 5-minute grace period. If you arrive later than this time, we will need to reschedule your appointment to a later time or date.   [ ] x  There is a $50 fee if the initial consultation visit, New Patient Appointment, or First Follow-up Appointment is a no-show, so please make sure to pick a time that works with your schedule.    [ x]  We recommend that all our patients sign up for and utilize [facility] [employer].   Pt to call back as she was not ready to pay the 99.00 fee at this time.

## 2024-02-12 ENCOUNTER — Telehealth: Payer: Self-pay

## 2024-02-12 NOTE — Telephone Encounter (Signed)
 Copied from CRM #[id number]. Topic: Clinical - Medication Question >> Feb 12, 2024  8:15 AM Pinkey ORN wrote: Reason for CRM: Prescription Request >> Feb 12, 2024  8:16 AM Pinkey ORN wrote: Patient is calling in, states her daughter was diagnosed with pink eye and now she has it and is requesting eye drops be sent into the pharmacy.

## 2024-03-25 ENCOUNTER — Telehealth: Admitting: Physician Assistant

## 2024-03-25 DIAGNOSIS — H05012 Cellulitis of left orbit: Secondary | ICD-10-CM

## 2024-03-25 NOTE — Progress Notes (Signed)

## 2024-03-30 ENCOUNTER — Telehealth: Payer: Self-pay

## 2024-03-30 NOTE — Transitions of Care (Post Inpatient/ED Visit) (Unsigned)
" ° °  03/30/2024  Name: Monique Shaw MRN: [id number] DOB: 1980-07-09  Today's TOC FU Call Status: Today's TOC FU Call Status:: Unsuccessful Call (1st Attempt) Unsuccessful Call (1st Attempt) Date: 03/30/24  Attempted to reach the patient regarding the most recent Inpatient/ED visit.  Follow Up Plan: Additional outreach attempts will be made to reach the patient to complete the Transitions of Care (Post Inpatient/ED visit) call.   Signature  Julian Lemmings, LPN [facility] Nurse Health Advisor Direct Dial [phone]  "

## 2024-03-31 NOTE — Transitions of Care (Post Inpatient/ED Visit) (Signed)
" ° °  03/31/2024  Name: Monique Shaw MRN: [id number] DOB: 01-May-1980  Today's TOC FU Call Status: Today's TOC FU Call Status:: Successful TOC FU Call Completed Unsuccessful Call (1st Attempt) Date: 03/30/24 [facility] FU Call Complete Date: 03/31/24  Patient's Name and Date of Birth confirmed. Name, DOB  Transition Care Management Follow-up Telephone Call Date of Discharge: 03/29/24 Discharge Facility: Other [facility]) Name of Other (Non[facility]) Discharge Facility: [facility] Type of Discharge: Inpatient Admission Primary Inpatient Discharge Diagnosis:: celluitis How have you been since you were released from the hospital?: Better Any questions or concerns?: No  Items Reviewed: Did you receive and understand the discharge instructions provided?: Yes Medications obtained,verified, and reconciled?: Yes (Medications Reviewed) Any new allergies since your discharge?: No Dietary orders reviewed?: Yes Do you have support at home?: No  Medications Reviewed Today: Medications Reviewed Today     Reviewed by Emmitt Pan, LPN (Licensed Practical Nurse) on 03/31/24 at 1155  Med List Status: <None>   Medication Order Taking? Sig Documenting Provider Last Dose Status Informant  buPROPion  (WELLBUTRIN  SR) 150 MG 12 hr tablet [id number] Yes Take 150 mg by mouth. [provider]  Active   dexlansoprazole (DEXILANT) 60 MG capsule [id number] Yes Take 1 capsule by mouth daily. [provider]  Active   diphenhydrAMINE  (BENADRYL ) 25 mg capsule [id number] Yes Take 1-2 capsules (25-50 mg total) by mouth every 6 (six) hours as needed (Headache). [name], MD  Active   metoCLOPramide  (REGLAN ) 10 MG tablet [id number] Yes Take 1 tablet (10 mg total) by mouth 3 (three) times daily with meals. [name], MD  Active   NIFEdipine  (PROCARDIA  XL/NIFEDICAL XL) 60 MG 24 hr tablet [id number] Yes Take 1 tablet (60 mg total) by mouth daily. [name], MD  Active   ondansetron   (ZOFRAN -ODT) 4 MG disintegrating tablet [id number] Yes Take 4 mg by mouth as needed for nausea or vomiting. [provider]  Active Self            Home Care and Equipment/Supplies: Were Home Health Services Ordered?: NA Any new equipment or medical supplies ordered?: NA  Functional Questionnaire: Do you need assistance with bathing/showering or dressing?: No Do you need assistance with meal preparation?: No Do you need assistance with eating?: No Do you have difficulty maintaining continence: No Do you need assistance with getting out of bed/getting out of a chair/moving?: No Do you have difficulty managing or taking your medications?: No  Follow up appointments reviewed: PCP Follow-up appointment confirmed?: No (declined) MD Provider Line Number:[phone] Given: No Specialist Hospital Follow-up appointment confirmed?: NA Do you need transportation to your follow-up appointment?: No Do you understand care options if your condition(s) worsen?: Yes-patient verbalized understanding    SIGNATURE [name], LPN [facility] Nurse Health Advisor Direct Dial [phone]  "
